# Patient Record
Sex: Female | Born: 1940 | Race: White | Hispanic: No | Marital: Married | Smoking: Former smoker
Health system: Southern US, Community
[De-identification: ages and names within clinical notes are randomized; demographics above are authoritative.]

## PROBLEM LIST (undated history)

## (undated) DIAGNOSIS — I701 Atherosclerosis of renal artery: Secondary | ICD-10-CM

## (undated) DIAGNOSIS — T7840XA Allergy, unspecified, initial encounter: Secondary | ICD-10-CM

## (undated) DIAGNOSIS — E119 Type 2 diabetes mellitus without complications: Secondary | ICD-10-CM

## (undated) DIAGNOSIS — F32A Depression, unspecified: Secondary | ICD-10-CM

## (undated) DIAGNOSIS — Z9289 Personal history of other medical treatment: Secondary | ICD-10-CM

## (undated) DIAGNOSIS — K219 Gastro-esophageal reflux disease without esophagitis: Secondary | ICD-10-CM

## (undated) DIAGNOSIS — M810 Age-related osteoporosis without current pathological fracture: Secondary | ICD-10-CM

## (undated) DIAGNOSIS — I10 Essential (primary) hypertension: Secondary | ICD-10-CM

## (undated) DIAGNOSIS — R112 Nausea with vomiting, unspecified: Secondary | ICD-10-CM

## (undated) DIAGNOSIS — I779 Disorder of arteries and arterioles, unspecified: Secondary | ICD-10-CM

## (undated) DIAGNOSIS — Z9889 Other specified postprocedural states: Secondary | ICD-10-CM

## (undated) DIAGNOSIS — Z789 Other specified health status: Secondary | ICD-10-CM

## (undated) DIAGNOSIS — I739 Peripheral vascular disease, unspecified: Secondary | ICD-10-CM

## (undated) DIAGNOSIS — I5189 Other ill-defined heart diseases: Secondary | ICD-10-CM

## (undated) DIAGNOSIS — G4700 Insomnia, unspecified: Secondary | ICD-10-CM

## (undated) DIAGNOSIS — F419 Anxiety disorder, unspecified: Secondary | ICD-10-CM

## (undated) DIAGNOSIS — E063 Autoimmune thyroiditis: Secondary | ICD-10-CM

## (undated) DIAGNOSIS — G4733 Obstructive sleep apnea (adult) (pediatric): Secondary | ICD-10-CM

## (undated) DIAGNOSIS — K5790 Diverticulosis of intestine, part unspecified, without perforation or abscess without bleeding: Secondary | ICD-10-CM

## (undated) DIAGNOSIS — H04123 Dry eye syndrome of bilateral lacrimal glands: Secondary | ICD-10-CM

## (undated) DIAGNOSIS — E785 Hyperlipidemia, unspecified: Secondary | ICD-10-CM

## (undated) DIAGNOSIS — D649 Anemia, unspecified: Secondary | ICD-10-CM

## (undated) DIAGNOSIS — H269 Unspecified cataract: Secondary | ICD-10-CM

## (undated) DIAGNOSIS — M1990 Unspecified osteoarthritis, unspecified site: Secondary | ICD-10-CM

## (undated) DIAGNOSIS — F329 Major depressive disorder, single episode, unspecified: Secondary | ICD-10-CM

## (undated) HISTORY — DX: Gastro-esophageal reflux disease without esophagitis: K21.9

## (undated) HISTORY — DX: Atherosclerosis of renal artery: I70.1

## (undated) HISTORY — DX: Autoimmune thyroiditis: E06.3

## (undated) HISTORY — DX: Hyperlipidemia, unspecified: E78.5

## (undated) HISTORY — DX: Other ill-defined heart diseases: I51.89

## (undated) HISTORY — DX: Essential (primary) hypertension: I10

## (undated) HISTORY — DX: Unspecified osteoarthritis, unspecified site: M19.90

## (undated) HISTORY — DX: Personal history of other medical treatment: Z92.89

## (undated) HISTORY — PX: OTHER SURGICAL HISTORY: SHX169

## (undated) HISTORY — PX: LIPOSUCTION: SHX10

## (undated) HISTORY — DX: Age-related osteoporosis without current pathological fracture: M81.0

## (undated) HISTORY — DX: Other specified health status: Z78.9

## (undated) HISTORY — DX: Disorder of arteries and arterioles, unspecified: I77.9

## (undated) HISTORY — DX: Obstructive sleep apnea (adult) (pediatric): G47.33

## (undated) HISTORY — DX: Peripheral vascular disease, unspecified: I73.9

## (undated) HISTORY — DX: Allergy, unspecified, initial encounter: T78.40XA

---
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
## 1997-02-28 ENCOUNTER — Ambulatory Visit (HOSPITAL_COMMUNITY): Admission: RE | Admit: 1997-02-28 | Discharge: 1997-02-28 | Payer: Self-pay | Admitting: Family Medicine

## 1997-04-25 ENCOUNTER — Encounter: Admission: RE | Admit: 1997-04-25 | Discharge: 1997-07-24 | Payer: Self-pay | Admitting: Family Medicine

## 1997-05-20 ENCOUNTER — Ambulatory Visit (HOSPITAL_COMMUNITY): Admission: RE | Admit: 1997-05-20 | Discharge: 1997-05-20 | Payer: Self-pay | Admitting: Endocrinology

## 1997-07-24 ENCOUNTER — Emergency Department (HOSPITAL_COMMUNITY): Admission: EM | Admit: 1997-07-24 | Discharge: 1997-07-24 | Payer: Self-pay | Admitting: Emergency Medicine

## 1997-08-14 ENCOUNTER — Ambulatory Visit (HOSPITAL_COMMUNITY): Admission: RE | Admit: 1997-08-14 | Discharge: 1997-08-14 | Payer: Self-pay | Admitting: Family Medicine

## 1998-01-22 ENCOUNTER — Emergency Department (HOSPITAL_COMMUNITY): Admission: EM | Admit: 1998-01-22 | Discharge: 1998-01-22 | Payer: Self-pay | Admitting: Emergency Medicine

## 1998-06-11 ENCOUNTER — Encounter: Payer: Self-pay | Admitting: Family Medicine

## 1998-06-11 ENCOUNTER — Ambulatory Visit (HOSPITAL_COMMUNITY): Admission: RE | Admit: 1998-06-11 | Discharge: 1998-06-11 | Payer: Self-pay | Admitting: Family Medicine

## 1998-07-30 ENCOUNTER — Other Ambulatory Visit: Admission: RE | Admit: 1998-07-30 | Discharge: 1998-07-30 | Payer: Self-pay | Admitting: Family Medicine

## 1998-08-04 ENCOUNTER — Ambulatory Visit: Admission: RE | Admit: 1998-08-04 | Discharge: 1998-08-04 | Payer: Self-pay | Admitting: Family Medicine

## 1998-09-15 ENCOUNTER — Encounter (INDEPENDENT_AMBULATORY_CARE_PROVIDER_SITE_OTHER): Payer: Self-pay | Admitting: Specialist

## 1998-09-15 ENCOUNTER — Ambulatory Visit (HOSPITAL_COMMUNITY): Admission: RE | Admit: 1998-09-15 | Discharge: 1998-09-15 | Payer: Self-pay | Admitting: Gastroenterology

## 1998-09-26 ENCOUNTER — Ambulatory Visit (HOSPITAL_COMMUNITY): Admission: RE | Admit: 1998-09-26 | Discharge: 1998-09-26 | Payer: Self-pay | Admitting: *Deleted

## 1999-04-22 ENCOUNTER — Encounter: Payer: Self-pay | Admitting: Emergency Medicine

## 1999-04-22 ENCOUNTER — Emergency Department (HOSPITAL_COMMUNITY): Admission: EM | Admit: 1999-04-22 | Discharge: 1999-04-23 | Payer: Self-pay | Admitting: Emergency Medicine

## 1999-06-22 ENCOUNTER — Emergency Department (HOSPITAL_COMMUNITY): Admission: EM | Admit: 1999-06-22 | Discharge: 1999-06-22 | Payer: Self-pay | Admitting: Emergency Medicine

## 1999-11-04 ENCOUNTER — Ambulatory Visit (HOSPITAL_COMMUNITY): Admission: RE | Admit: 1999-11-04 | Discharge: 1999-11-04 | Payer: Self-pay | Admitting: Gynecology

## 1999-11-04 ENCOUNTER — Encounter: Payer: Self-pay | Admitting: Gynecology

## 2000-02-05 ENCOUNTER — Other Ambulatory Visit: Admission: RE | Admit: 2000-02-05 | Discharge: 2000-02-05 | Payer: Self-pay | Admitting: Internal Medicine

## 2000-02-29 ENCOUNTER — Encounter: Payer: Self-pay | Admitting: Emergency Medicine

## 2000-02-29 ENCOUNTER — Emergency Department (HOSPITAL_COMMUNITY): Admission: EM | Admit: 2000-02-29 | Discharge: 2000-02-29 | Payer: Self-pay | Admitting: Emergency Medicine

## 2000-04-01 ENCOUNTER — Encounter: Admission: RE | Admit: 2000-04-01 | Discharge: 2000-06-16 | Payer: Self-pay | Admitting: Orthopedic Surgery

## 2000-06-07 ENCOUNTER — Ambulatory Visit (HOSPITAL_COMMUNITY): Admission: RE | Admit: 2000-06-07 | Discharge: 2000-06-07 | Payer: Self-pay | Admitting: Gastroenterology

## 2000-11-08 ENCOUNTER — Encounter: Payer: Self-pay | Admitting: Family Medicine

## 2000-11-08 ENCOUNTER — Ambulatory Visit (HOSPITAL_COMMUNITY): Admission: RE | Admit: 2000-11-08 | Discharge: 2000-11-08 | Payer: Self-pay | Admitting: Family Medicine

## 2000-11-25 ENCOUNTER — Encounter: Admission: RE | Admit: 2000-11-25 | Discharge: 2000-11-25 | Payer: Self-pay | Admitting: Family Medicine

## 2000-11-30 ENCOUNTER — Encounter: Payer: Self-pay | Admitting: General Surgery

## 2000-11-30 ENCOUNTER — Ambulatory Visit (HOSPITAL_COMMUNITY): Admission: RE | Admit: 2000-11-30 | Discharge: 2000-11-30 | Payer: Self-pay | Admitting: General Surgery

## 2000-12-01 ENCOUNTER — Ambulatory Visit (HOSPITAL_COMMUNITY): Admission: RE | Admit: 2000-12-01 | Discharge: 2000-12-01 | Payer: Self-pay | Admitting: Family Medicine

## 2001-01-30 ENCOUNTER — Encounter: Admission: RE | Admit: 2001-01-30 | Discharge: 2001-01-30 | Payer: Self-pay | Admitting: Family Medicine

## 2001-01-30 ENCOUNTER — Encounter: Payer: Self-pay | Admitting: Family Medicine

## 2001-02-02 ENCOUNTER — Encounter: Admission: RE | Admit: 2001-02-02 | Discharge: 2001-02-02 | Payer: Self-pay | Admitting: Family Medicine

## 2001-02-02 ENCOUNTER — Encounter: Payer: Self-pay | Admitting: Family Medicine

## 2001-06-16 ENCOUNTER — Ambulatory Visit (HOSPITAL_BASED_OUTPATIENT_CLINIC_OR_DEPARTMENT_OTHER): Admission: RE | Admit: 2001-06-16 | Discharge: 2001-06-16 | Payer: Self-pay | Admitting: Family Medicine

## 2001-07-07 ENCOUNTER — Ambulatory Visit (HOSPITAL_BASED_OUTPATIENT_CLINIC_OR_DEPARTMENT_OTHER): Admission: RE | Admit: 2001-07-07 | Discharge: 2001-07-07 | Payer: Self-pay | Admitting: Family Medicine

## 2001-12-04 ENCOUNTER — Encounter: Payer: Self-pay | Admitting: Internal Medicine

## 2001-12-04 ENCOUNTER — Ambulatory Visit (HOSPITAL_COMMUNITY): Admission: RE | Admit: 2001-12-04 | Discharge: 2001-12-04 | Payer: Self-pay | Admitting: Internal Medicine

## 2002-03-01 ENCOUNTER — Ambulatory Visit (HOSPITAL_COMMUNITY): Admission: RE | Admit: 2002-03-01 | Discharge: 2002-03-01 | Payer: Self-pay | Admitting: Family Medicine

## 2002-03-01 ENCOUNTER — Encounter: Payer: Self-pay | Admitting: Family Medicine

## 2002-04-03 ENCOUNTER — Other Ambulatory Visit: Admission: RE | Admit: 2002-04-03 | Discharge: 2002-04-03 | Payer: Self-pay | Admitting: Obstetrics and Gynecology

## 2002-07-25 ENCOUNTER — Encounter: Payer: Self-pay | Admitting: Gastroenterology

## 2002-07-25 ENCOUNTER — Encounter: Admission: RE | Admit: 2002-07-25 | Discharge: 2002-07-25 | Payer: Self-pay | Admitting: Gastroenterology

## 2002-08-14 ENCOUNTER — Encounter: Admission: RE | Admit: 2002-08-14 | Discharge: 2002-08-14 | Payer: Self-pay | Admitting: Obstetrics and Gynecology

## 2002-08-14 ENCOUNTER — Encounter: Payer: Self-pay | Admitting: Obstetrics and Gynecology

## 2002-12-27 ENCOUNTER — Encounter: Admission: RE | Admit: 2002-12-27 | Discharge: 2002-12-27 | Payer: Self-pay | Admitting: Gastroenterology

## 2003-02-20 ENCOUNTER — Encounter: Admission: RE | Admit: 2003-02-20 | Discharge: 2003-02-20 | Payer: Self-pay | Admitting: Obstetrics and Gynecology

## 2003-05-03 ENCOUNTER — Encounter: Admission: RE | Admit: 2003-05-03 | Discharge: 2003-05-03 | Payer: Self-pay | Admitting: Orthopedic Surgery

## 2003-05-09 ENCOUNTER — Other Ambulatory Visit: Admission: RE | Admit: 2003-05-09 | Discharge: 2003-05-09 | Payer: Self-pay | Admitting: Obstetrics and Gynecology

## 2003-11-02 ENCOUNTER — Emergency Department (HOSPITAL_COMMUNITY): Admission: EM | Admit: 2003-11-02 | Discharge: 2003-11-02 | Payer: Self-pay

## 2004-01-26 HISTORY — PX: NASAL SINUS SURGERY: SHX719

## 2004-01-28 ENCOUNTER — Ambulatory Visit: Payer: Self-pay | Admitting: Internal Medicine

## 2004-03-24 ENCOUNTER — Ambulatory Visit: Payer: Self-pay | Admitting: Internal Medicine

## 2004-04-14 ENCOUNTER — Emergency Department (HOSPITAL_COMMUNITY): Admission: EM | Admit: 2004-04-14 | Discharge: 2004-04-14 | Payer: Self-pay | Admitting: Family Medicine

## 2004-04-30 ENCOUNTER — Ambulatory Visit: Payer: Self-pay | Admitting: Internal Medicine

## 2004-06-23 ENCOUNTER — Ambulatory Visit: Payer: Self-pay | Admitting: Internal Medicine

## 2004-07-03 ENCOUNTER — Encounter: Admission: RE | Admit: 2004-07-03 | Discharge: 2004-07-03 | Payer: Self-pay | Admitting: Internal Medicine

## 2004-07-08 ENCOUNTER — Encounter (INDEPENDENT_AMBULATORY_CARE_PROVIDER_SITE_OTHER): Payer: Self-pay | Admitting: *Deleted

## 2004-07-08 ENCOUNTER — Ambulatory Visit: Payer: Self-pay | Admitting: Internal Medicine

## 2004-07-10 ENCOUNTER — Emergency Department (HOSPITAL_COMMUNITY): Admission: EM | Admit: 2004-07-10 | Discharge: 2004-07-10 | Payer: Self-pay | Admitting: Family Medicine

## 2004-07-24 ENCOUNTER — Ambulatory Visit: Payer: Self-pay | Admitting: Internal Medicine

## 2004-07-30 ENCOUNTER — Ambulatory Visit (HOSPITAL_COMMUNITY): Admission: RE | Admit: 2004-07-30 | Discharge: 2004-07-30 | Payer: Self-pay | Admitting: Internal Medicine

## 2004-07-30 ENCOUNTER — Ambulatory Visit: Payer: Self-pay | Admitting: Internal Medicine

## 2004-08-06 ENCOUNTER — Ambulatory Visit: Payer: Self-pay | Admitting: Internal Medicine

## 2004-08-20 ENCOUNTER — Ambulatory Visit: Payer: Self-pay | Admitting: Internal Medicine

## 2004-09-03 ENCOUNTER — Ambulatory Visit: Payer: Self-pay | Admitting: Internal Medicine

## 2004-09-15 ENCOUNTER — Ambulatory Visit (HOSPITAL_COMMUNITY): Admission: RE | Admit: 2004-09-15 | Discharge: 2004-09-15 | Payer: Self-pay | Admitting: Otolaryngology

## 2004-09-18 ENCOUNTER — Encounter (INDEPENDENT_AMBULATORY_CARE_PROVIDER_SITE_OTHER): Payer: Self-pay | Admitting: Specialist

## 2004-09-18 ENCOUNTER — Observation Stay (HOSPITAL_COMMUNITY): Admission: RE | Admit: 2004-09-18 | Discharge: 2004-09-19 | Payer: Self-pay | Admitting: Otolaryngology

## 2004-11-13 ENCOUNTER — Encounter (INDEPENDENT_AMBULATORY_CARE_PROVIDER_SITE_OTHER): Payer: Self-pay | Admitting: *Deleted

## 2004-11-13 ENCOUNTER — Ambulatory Visit: Payer: Self-pay | Admitting: Internal Medicine

## 2004-12-31 ENCOUNTER — Ambulatory Visit: Payer: Self-pay | Admitting: Internal Medicine

## 2005-04-27 LAB — HM COLONOSCOPY: HM Colonoscopy: NORMAL

## 2005-07-27 ENCOUNTER — Ambulatory Visit: Payer: Self-pay | Admitting: Internal Medicine

## 2005-09-01 ENCOUNTER — Ambulatory Visit: Payer: Self-pay | Admitting: Internal Medicine

## 2005-11-03 ENCOUNTER — Encounter (INDEPENDENT_AMBULATORY_CARE_PROVIDER_SITE_OTHER): Payer: Self-pay | Admitting: *Deleted

## 2005-11-03 ENCOUNTER — Encounter: Admission: RE | Admit: 2005-11-03 | Discharge: 2005-11-03 | Payer: Self-pay

## 2005-11-15 DIAGNOSIS — M2550 Pain in unspecified joint: Secondary | ICD-10-CM | POA: Insufficient documentation

## 2005-11-15 DIAGNOSIS — R002 Palpitations: Secondary | ICD-10-CM

## 2005-11-15 DIAGNOSIS — M549 Dorsalgia, unspecified: Secondary | ICD-10-CM | POA: Insufficient documentation

## 2005-11-15 DIAGNOSIS — H1045 Other chronic allergic conjunctivitis: Secondary | ICD-10-CM | POA: Insufficient documentation

## 2005-11-15 DIAGNOSIS — K219 Gastro-esophageal reflux disease without esophagitis: Secondary | ICD-10-CM | POA: Insufficient documentation

## 2005-11-15 DIAGNOSIS — J309 Allergic rhinitis, unspecified: Secondary | ICD-10-CM | POA: Insufficient documentation

## 2005-11-15 DIAGNOSIS — K649 Unspecified hemorrhoids: Secondary | ICD-10-CM | POA: Insufficient documentation

## 2005-11-15 DIAGNOSIS — M810 Age-related osteoporosis without current pathological fracture: Secondary | ICD-10-CM

## 2005-11-15 DIAGNOSIS — K8020 Calculus of gallbladder without cholecystitis without obstruction: Secondary | ICD-10-CM | POA: Insufficient documentation

## 2005-11-15 DIAGNOSIS — E0500 Thyrotoxicosis with diffuse goiter without thyrotoxic crisis or storm: Secondary | ICD-10-CM | POA: Insufficient documentation

## 2005-11-15 DIAGNOSIS — E039 Hypothyroidism, unspecified: Secondary | ICD-10-CM | POA: Insufficient documentation

## 2005-11-15 DIAGNOSIS — K589 Irritable bowel syndrome without diarrhea: Secondary | ICD-10-CM

## 2005-11-17 ENCOUNTER — Ambulatory Visit: Payer: Self-pay | Admitting: Internal Medicine

## 2006-01-10 DIAGNOSIS — M1990 Unspecified osteoarthritis, unspecified site: Secondary | ICD-10-CM | POA: Insufficient documentation

## 2006-02-04 ENCOUNTER — Ambulatory Visit: Payer: Self-pay | Admitting: Internal Medicine

## 2006-02-14 ENCOUNTER — Encounter: Admission: RE | Admit: 2006-02-14 | Discharge: 2006-02-14 | Payer: Self-pay | Admitting: Gastroenterology

## 2006-04-22 ENCOUNTER — Ambulatory Visit: Payer: Self-pay | Admitting: Internal Medicine

## 2006-04-28 LAB — HM PAP SMEAR: HM Pap smear: NORMAL

## 2006-05-10 ENCOUNTER — Ambulatory Visit: Payer: Self-pay | Admitting: Internal Medicine

## 2006-06-15 ENCOUNTER — Encounter: Admission: RE | Admit: 2006-06-15 | Discharge: 2006-06-15 | Payer: Self-pay | Admitting: Gastroenterology

## 2006-09-22 ENCOUNTER — Ambulatory Visit: Payer: Self-pay | Admitting: Unknown Physician Specialty

## 2006-09-25 ENCOUNTER — Ambulatory Visit: Payer: Self-pay | Admitting: Unknown Physician Specialty

## 2006-11-09 ENCOUNTER — Ambulatory Visit: Payer: Self-pay | Admitting: Unknown Physician Specialty

## 2007-09-18 ENCOUNTER — Ambulatory Visit: Payer: Self-pay | Admitting: Family Medicine

## 2009-01-29 ENCOUNTER — Ambulatory Visit: Payer: Self-pay

## 2009-04-27 LAB — HM DEXA SCAN

## 2009-08-24 ENCOUNTER — Emergency Department: Payer: Self-pay | Admitting: Internal Medicine

## 2009-09-23 ENCOUNTER — Ambulatory Visit: Payer: Self-pay | Admitting: Unknown Physician Specialty

## 2009-12-05 ENCOUNTER — Ambulatory Visit: Payer: Self-pay

## 2010-01-01 ENCOUNTER — Emergency Department: Payer: Self-pay | Admitting: Emergency Medicine

## 2010-02-14 ENCOUNTER — Encounter: Payer: Self-pay | Admitting: Gastroenterology

## 2010-02-15 ENCOUNTER — Encounter: Payer: Self-pay | Admitting: Otolaryngology

## 2010-03-05 ENCOUNTER — Ambulatory Visit: Payer: Self-pay | Admitting: Family Medicine

## 2010-04-28 LAB — HM MAMMOGRAPHY: HM Mammogram: NORMAL

## 2010-06-12 NOTE — Procedures (Signed)
[place]  Patient:    Yvonne Davidson, Yvonne Davidson                   MRN: [id number] Proc. Date: 06/16/00 Attending:  Rich Brave CC:         Chales Salmon. Abigail Miyamoto, M.D.   Procedure Report  PROCEDURE:  Twenty-four ambulatory esophageal pH monitoring.  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  FINDINGS:  Mild reflux into the proximal probe while in the upright position, consistent with a diagnosis of laryngopharyngeal reflux.  DESCRIPTION OF PROCEDURE:  The procedure was performed immediately following her esophageal manometry.  The probe was placed, and the patient went home with it and returned almost exactly 24 hours later.  She did not keep a very complete symptom diary, but she notes that this was a relatively good day during the time that the recording was made, perhaps because she stopped some medications that she ordinarily takes and which she thinks tend to bother her.  FINDINGS:  Proximal esophageal probe:  The proximal esophageal probe had a total acid exposure time of 0.4% (0.7% while in the upright position).  While this is considered within normal limits by the parameters provided by the software program, experienced ENT specialists in pharyngeal reflux feel that any acid exposure in the proximal probe is abnormal, and the fact this occurred in the upright position is very characteristic of laryngopharyngeal reflux.  The longest episode lasted two minutes.  In the distal probe, the DeMeester score came out within normal limits, namely 9.7, with the normal being up to 22.0.  Total acid reflux time in the distal probe was 0.9%, with normal being up to 5.5%, and no episodes of reflux lasted over five minutes.  IMPRESSION:  Evidence for laryngopharyngeal reflux based on this study, as described above.  PLAN:  I would continue this patient on PPI  therapy. DD:  06/16/00 TD:  06/17/00 Job: [id number]

## 2010-06-12 NOTE — Letter (Signed)
April 22, 2006    Cammy Copa, M.D.  [facility]  [facility]  Fax [phone].   RE:  Yvonne Davidson, Yvonne Davidson  MRN:  [id number]  /  DOB:  Sep 21, 1940   Dear Ananias Pilgrim:   Apparently, I am to write a letter of reference so that you can meet  Georgianne Fick for her concerns of sleep disordered breathing. I had  discussed availability of referral for another opinion and she acted on  it herself. I apologize for the hold up. I have seen this woman a number  of times over several years, and most recently on March 28 of this year.  I understand that our records have already been sent and my most recent  note of 3/28 should be descriptive. She has been evaluated in  [place], [place], and [place], and has tried a variety of CPAP  masks and pressures. Not all of her studies have confirmed sleep apnea.  She had a polysomnogram on 04/04/2005 at [facility] recording an AHI of 8.48  per hour, mostly central apneas and hypopneas. They titrated her on  BiPAP, settling for inspiratory 10/expiratory 6 CWP. She had not been  able to get comfortable with either CPAP or BiPAP. The description she  gave me focused on her concerns about blood pressure management,  thinking that she was about to have a cerebrovascular event because of  the lack of oxygen at night. She blamed sleep apnea, although the  discomforts were usually as she became distressed while lying awake. Her  concept was that she would wake up sometimes with a sense that she had  held her breath and sometimes not. She will feel stressed and restless,  and will get some symptoms suggestive of hyperventilation. She has been  quite anxious, which is where I hope that you might be able to make some  contribution. Her chart now is substantial, including a lot of outside  records. I will let you review the records that she has already  requested. If there is anything more that you need, please contact us  and  I will see what I can do.   Thank you for your help.    Sincerely,      Clinton D. Maple Hudson, MD, Tonny Bollman, FACP  Electronically Signed    CDY/MedQ  DD: 06/30/2006  DT: 07/01/2006  Job #: [id number]

## 2010-06-12 NOTE — Assessment & Plan Note (Signed)
[facility]                               PULMONARY OFFICE NOTE   ANNAKA, CLEAVER                   MRN:          [id number]  DATE:11/17/2005                            DOB:          05-28-1940    PROBLEM:  1. Obstructive sleep apnea.  2. Allergic rhinitis.  3. History of sinus surgeries.  4. Chronic fatigue and non-restorative sleep.  5. Esophageal reflux.  6. Hypothyroidism.   HISTORY:  She gave up on her bilevel machine for sleep apnea after calling  me October 7 and saying that it was too uncomfortable, associated with  frequent headaches and nose bleeds and her eyes felt swollen and that she  was having to irrigate her sinuses every day.  She said she felt better  without it and was pending an appointment with Dr. Annalee Genta.  She has had 3  sleep studies this year at [facility] and now sees me for her sleep apnea  after working with those doctors.  I do not have any of those reports.  We  told her to go ahead and stop the BIPAP for now, send me the [facility]  reports and call me after she had seen Dr. Annalee Genta again.  She had been  trying to sleep propped up, both to deal with sleep apnea and also to  relieve head congestion but finds that she is sliding off of her wedge.  She  has been doing daily nasal saline irrigation and blaming allergy for a  left hemi-cranial headache and some occasional erectile orbital pressure.  She understood that Dr. Annalee Genta felt her airway looked pretty good now  after surgery.  He had sent her for a second surgical opinion to a Dr.  Gillermina Hu at [facility] to discuss surgical sleep apnea options and she said  Dr. Gillermina Hu was not very encouraging about the odds, offering her 40% chance  of a good clearing of her apnea.  She also evaluated with a dentist at  [facility] for oral appliances and was told her teeth were not good enough  and she said that exam was not encouraging either.  Primary  complaint is of  fatigue and non-restorative sleep.   MEDICATION:  1. Atenolol 75 mg.  2. Evista 60 mg.  3. Lisinopril 5 mg.  4. Patanol.  5. Zyrtec.  6. Synthroid 50 mcg.  7. Multivitamins and minerals.  8. Flonase.  9. BIPAP had been on 9/6.   DRUG INTOLERANCES:  To HCTZ, CODEINE, SUMYCIN, INDOCIN, NOVOCAIN, OXYCODONE,  NEXIUM, PRILOSEC, KETEK and TRIPLE CARE SALVE.   OBJECTIVE:  Weight 149 pounds.  Blood pressure 106/66.  Pulse regular 50.  Room air saturation 98%.  LUNGS:  Are clear.  She has a long palate without postnasal drainage.  Voice quality is normal.  There is no strider.  She has a little bit of turbinate puffiness, but no  obstruction.  Thyroid is not enlarged.  Heart sound are normal.   IMPRESSION:  Her primary complaint is of non-restorative sleep and fatigue.  We know she has sleep apnea, I am not sure  how much of her sleep complaint  really is related to inadequately treated sleep apnea and how much may be  depression, but she is discouraged about the lack of clear forward option.   PLAN:  1. I discussed Vivactil as a nonsedating antidepressant that might be of      some help with at least some of her symptoms and explained it had been      used in the past for mild sleep apnea.  Try samples of Vivactil 10 mg      1/2 daily.  Suggest trying them in the morning first.  2. If that does not help then we will relook at the whole issue of sleep      apnea control with CPAP.  3. Schedule return in 2 months, earlier as needed.       Clinton D. Maple Hudson, MD, FCCP, FACP      CDY/MedQ  DD:  11/17/2005  DT:  11/18/2005  Job #:  [id number]   cc:   Onalee Hua L. Annalee Genta, M.D.

## 2010-06-12 NOTE — Assessment & Plan Note (Signed)
[facility]                               PULMONARY OFFICE NOTE   Yvonne Davidson, Yvonne Davidson                   MRN:          [id number]  DATE:09/01/2005                            DOB:          1940-09-27    1. Obstructive sleep apnea.  2. Allergic rhinitis.  3. History of sinus surgeries.  4. Chronic fatigue and nonrestorative sleep.  5. Esophageal reflux.  6. Hypothyroid.   HISTORY:  She has been using BiPAP with an inspiratory pressure of 9 and an  expiratory pressure of 6.  She did not try the Provigil.  She complains that  when she wakes in the morning she still is puffy around the eyes, and she  interprets this as a sign that her blood pressure is elevated from lack of  oxygen.  She also feels in a nonspecific way that her airway is messed up.  She describes three acute events during which she thinks she aspirated.  What she describes sounds like laryngospasm without recognized associated  heartburn.  She does choke easily but is careful when eating, and she sips  water to calm her throat.  She complains of sinus congestion, eyes watering.  She says Flonase is not as good as Astelin.  She feels better with BiPAP  than without it.   MEDICATIONS:  1. Atenolol 75 mg.  2. Evista 60 mg.  3. Lisinopril 5 mg.  4. Patanol.  5. Lubricant eye drops.  6. Zyrtec.  7. Synthroid 50 mcg.  8. OTC Prilosec.  9. Calcium.  10.Multivitamins.  11.Flonase.  12.Provigil.  She says she is not using.  13.Astelin.  We are adding today.  14.BiPAP.   ALLERGIES:  DRUG INTOLERANCES TO HCTZ, CODEINE, SUMYCIN, INDOCIN, NOVOCAIN,  OXYCODONE, NEXIUM, PRILOSEC, KETEK, AND TRIPLE CARE SALVE.   OBJECTIVE:  Weight 149 pounds, BP 136/70, pulse regular at 47, room air  saturation 98%.  She is alert, very talkative, in no evident distress.  There are no pressure marks on her face from the mask.  Her conjunctivae are  clear.  Her pharynx is clear with no erythema and no  adenomatous prominence  and no postnasal drainage.  Voice quality is normal.  There is no neck vein  distention, stridor or thyromegaly that I appreciate today.  Chest is quiet  and clear with good air flow.  Heart sounds are regular without murmur or  gallop.   IMPRESSION:  1. Atypical patient understanding of her medical symptoms.  2. Allergic and vasomotor rhinitis.  3. Obstructive sleep apnea.  4. Anxiety component.  5. Esophageal reflux with probable occasional laryngospasm.   PLAN:  1. An effort was made at education, reassurance and explanation.  2. Suggested that she might try caffeine occasionally as a gentle      stimulant if necessary but main effort is on sleep hygiene.  3. Sample and prescription for Astelin nasal spray once each nostril      b.i.d. p.r.n.  4. I considered Singulair trial, but we are out of samples.  5. We are going to have [facility] change her BiPAP to  an      inspiratory pressure of 9 and an expiratory pressure of 5.  6. Elevate head of bed for complaint of head congestion.  7. Consider going to the [facility] for yoga classes for relaxation and better      somatic control.  8. Schedule return in 3 months, earlier p.r.n.                                   Clinton D. Maple Hudson, MD, FCCP, FACP   CDY/MedQ  DD:  09/02/2005  DT:  09/02/2005  Job #:  [id number]

## 2010-06-12 NOTE — Assessment & Plan Note (Signed)
[facility]                                 ON-CALL NOTE   Yvonne Davidson, Yvonne Davidson                   MRN:          [id number]  DATE:12/17/2005                            DOB:          07-13-1940    [name] was calling the office, but it had already closed.  She  had questions about her CPAP.  I instructed her to call the office on  Monday, December 20, 2005.  She was advised to do this and said she  would follow through.     Charlcie Cradle Delford Field, MD, [facility]  Electronically Signed    PEW/MedQ  DD: 12/17/2005  DT: 12/17/2005  Job #: [id number]   cc:   Joni Fears D. Maple Hudson, MD, FCCP, FACP

## 2010-06-12 NOTE — Assessment & Plan Note (Signed)
[facility]                             PULMONARY OFFICE NOTE   TERRA, AVENI                   MRN:          [id number]  DATE:04/22/2006                            DOB:          1940/06/27    PROBLEMS:  1. Obstructive sleep apnea.  2. Insomnia.  3. Allergic rhinitis.  4. History of sinus surgeries.  5. Chronic fatigue and non-restorative sleep.  6. Esophageal reflux.  7. Hypothyroid.   HISTORY:  She complains that her blood pressure rises when she is  uncomfortable, because of  lack of oxygen at night. She wakes up then  feels stressed, gets restless, tosses and turns a lot, and says that her  feet and fingers tingle. For all of this she blames her sleep apnea,  although her complaint is that she lies awake tossing and turning. She  is pretty sure that she must be stopping breathing and is frustrated  that she cannot tolerate her BiPAP, this was turned back in for her  CPAP, which has been set on 8 CWP, saying that too much air is blowing  in her face and there is too much pressure on her nose from the mask.  She wakes with the impression that she is gasping for air and says that  with CPAP she was air swallowing. She believed BiPAP caused her face to  swell despite trying several different masks. She had previously  reported sleep studies at [facility], which had titrated her to a BiPAP  pressure of 14/12 with auto-titration. She had talked with her primary  physician, Dr. Dareen Piano at [facility]. He sent her to the [facility] sleep center and they gave her a full face mask which she  thinks she likes better. She had been previously evaluated at the [facility] about oral appliances and was told that her teeth  were not strong enough.  Currently, she only has a CPAP machine at home  and she has not been using it. She agrees to try with a reduction in  pressure to 6 CWP empirically. She then shifted  discussion to spring  pollen concerns, saying that her eyes were itching and watering and she  asked for a sample of Optivar. She very much likes Singulair.   MEDICATIONS:  1. Atenolol 75 mg.  2. Evista 60 mg.  3. Lisinopril 5 mg.  4. Lubricant eye drops.  5. Synthroid 50 mcg.  6. Multivitamins.  7. Flonase.  8. Singulair.  9. Optivar.   OBJECTIVE:  Weight 146 pounds, blood pressure 122/70, pulse regular 54,  room air saturation 98%. Pressured speech. It was difficult to  interrupt, to get in questions, and responses. Thyroid is not enlarged.  No tremor. Palms dry. No strider. Nasal airway is unobstructed. Lungs  are clear. Heart sounds normal.   IMPRESSION:  1. Previously demonstrated obstructive sleep apnea, but with a      significant anxiety and insomnia component.  2. Seasonal allergic rhinitis.   PLAN:  1. Sample Optivar.  2. Sample Lunesta 2 mg with discussion.  3. We will have  advanced reduce her CPAP to 6 CWP, scheduling return      in 1 month.     Clinton D. Maple Hudson, MD, Tonny Bollman, FACP  Electronically Signed    CDY/MedQ  DD: 04/22/2006  DT: 04/23/2006  Job #: [id number]

## 2010-06-12 NOTE — Op Note (Signed)
NAME:  Yvonne Davidson, Yvonne Davidson            ACCOUNT NO.:  [id number]   MEDICAL RECORD NO.:  [id number]          PATIENT TYPE:  OBV   LOCATION:  4707                         FACILITY:  [facility]   PHYSICIAN:  Kinnie Scales. Annalee Genta, M.D.DATE OF BIRTH:  12/24/1940   DATE OF PROCEDURE:  09/18/2004  DATE OF DISCHARGE:                                 OPERATIVE REPORT   PREOPERATIVE DIAGNOSES:  1.  Chronic sinusitis.  2.  Opacified right sphenoid sinus.  3.  Left ethmoid osteoma.   POSTOPERATIVE DIAGNOSES:  1.  Chronic sinusitis.  2.  Opacified right sphenoid sinus.  3.  Left ethmoid osteoma.   INDICATION FOR PROCEDURE:  1.  Chronic sinusitis.  2.  Opacified right sphenoid sinus.  3.  Left ethmoid osteoma.   SURGICAL PROCEDURE:  Revision endoscopic sinus surgery with right  sphenoidotomy and removal of diseased tissue, left maxillary antrostomy with  removal of diseased tissue and resection of left ethmoid osteoma with  stealth computer-assisted navigation.   ANESTHESIA:  General endotracheal.   SURGEON:  Kinnie Scales. Annalee Genta, M.D.   COMPLICATIONS:  None.   BLOOD LOSS:  Less than 100 mL.  Patient transferred from the operating room  to the recovery room in stable condition.   BRIEF HISTORY:  Ms. Erby is a 70 year old white female who presented  for evaluation of chronic sinus complaints with postnasal discharge,  coughing and intermittent headaches.  The patient had undergone prior  endoscopic sinus surgery by Dr. Lazarus Salines in 1991 and had had an excellent  result with good long-term improvement in recurrent infections.  The patient  had been treated with antibiotics and a CT scan was obtained which showed an  opacification of the right sphenoid sinus.  She also noted to have mucosal  thickening and disease within the left maxillary sinus and a small bony mass  consistent with osteoma in the left ethmoid/nasal frontal region.  Given the  patient's history, examination and physical  findings, with failure to  respond to antibiotic therapy, I recommended that we consider endoscopic  sinus surgery primarily directed toward the right sphenoid sinus to  establish proper drainage and reduce the risk of long-term infection.  I  also recommended removing the ethmoid osteoma and maxillary sinus disease on  the left.  The risk, benefits and possible complications of revision  endoscopic sinus surgery were discussed in detail with the patient, who  understood and concurred with our plan for surgery which is scheduled as  above.  Prior to surgery, a CT scan was obtained for computer-assisted  navigation (stealth) which was used throughout the surgical procedure.   OPERATION:  The patient was brought to the operating room on 09/18/2004 at  [facility] OR.  General endotracheal anesthesia was  established without difficulty.  When the patient was adequately  anesthetized, her nose was injected a total of 10 mL of 1% lidocaine and  100:000 epinephrine, which were was injected along the middle turbinate and  lateral nasal wall bilaterally as well as posterior ethmoid region, sphenoid  and the transoral injection of the sphenopalatine ostium.  The patient's  nose  was then packed with Afrin-soaked cottonoid pledgets which were left in  place for approximately 10 minutes for operative vasoconstriction.  She was  prepped and draped and the Stealth computer-assisted navigation device was  applied and anatomical surgical landmarks were identified and confirmed.   Surgical procedure was begun with 0-degree endoscopy.  The patient was found  to have significant scar tissue along the right middle turbinate obstructing  the ostiomeatal complex.  She also had complete opacification of the ostium  of the right sphenoid sinuses.  This was gently palpated under Stealth  guidance and a moderate amount of mucopurulent material was suctioned and  this was sent to Microbiology for  culture, sensitivity and Gram's stain.  The natural ostium of the sphenoid sinus was then visualized directly and  palpated.  The bony and soft tissue margins were resected using through-  cutting forceps and microdebrider was used to enlarge the ostium in a  lateral and inferior direction, creating a widely patent right sphenoid  ostium.  There was minimal bleeding around the margin of the ostium, which  was cauterized with suction cautery.  The residual scar tissue and disease  along the right anterior ethmoid area was resected using a microdebrider and  a small portion of anterior middle turbinate concha bullosa was also  resected.  the remainder of the sinuses on the right-hand side appeared  patent and well-healed with no minimal scar tissue and no evidence of  infection.   Attention was then turned the patient's left-hand side where a 0-degree  endoscopy was performed.  The maxillary and ethmoid sinuses appeared to be  patent.  There was a large polypoid mass with an associated osteoma in the  superior aspect of the ethmoid sinus on the left.  The left sphenoid ostium  was patent.  There was also a soft tissue mass within the left maxillary  sinus.  Using a 30-degree telescope and a curved microdebrider. the natural  ostium of the left maxillary sinus was enlarged and the curved microdebrider  was inserted in the sinus in order to resect polypoid material and findings  were consistent with a small mucous retention cyst, no evidence of  infection.  Dissection was then carried out along the ethmoid sinus from  posterior-to-anterior along the roof of the ethmoid.  There was a 3-mm bony  mass in the superior aspect of the ethmoid cavity adjacent to the  nasofrontal recess.  This was gently palpated and reflected anteriorly.  Using a through-cutting forceps, I was able to remove the osteoma in its  entirety, preserving the surrounding mucosa.  There was a small tear in the lateral aspect  of the middle turbinate, but no other significant trauma to  the region and the nasal frontal recess was widely patent at the conclusion  of the conclusion of the surgical procedure.  A small amount of polypoid  mucosa was then resected and that portion of procedure was concluded.  Attention was then turned to the left sphenoid ostium, which was patent, and  there was no evidence of infection.  There was a small scar tissue band  separate in the sinus ostium, which was resected with through-cutting  forceps, which was accomplished with minimal bleeding.  The patient's nasal  cavity and nasopharynx were then irrigated and suctioned, orogastric tube  was passed and stomach contents were aspirated.  The patient was awakened  from her anesthetic, she was extubated and was then transferred from the  operating room to the  recovery room in stable condition.  No complications.  Estimated blood loss was approximately 100 mL.           ______________________________  Kinnie Scales. Annalee Genta, M.D.     DLS/MEDQ  D:  53/66/4403  T:  09/19/2004  Job:  [id number]

## 2010-06-12 NOTE — Procedures (Signed)
[place]  Patient:    Yvonne Davidson, Yvonne Davidson                   MRN: [id number] Proc. Date: 06/07/00 Adm. Date:  [id number] Disc. Date: [id number] Attending:  Rich Brave CC:         Chales Salmon. Abigail Miyamoto, M.D.   Procedure Report  PROCEDURE:  Esophageal manometry.  INDICATION:  This is a patient being considered for 24-hour ambulatory manometry because of possible laryngopharyngeal reflux disease, atypical in character, and not readily responsive to antireflux medication.  FINDINGS:  Normal exam other than some isolated increased amplitudes of contraction.  DESCRIPTION OF PROCEDURE:  The patient provided written consent for the procedure, which was performed by the endoscopy nurse at [facility].  The patient had been off her Zantac for several days prior to the procedure.  The solid-state manometry catheter was passed transnasally and positioned at the lower esophageal sphincter, whereupon it was polled in a sequential fashion and after obtaining an LES study, the esophageal body study and upper esophageal sphincter studies were performed.  FINDINGS: 1. LES:  The lower esophageal sphincter had slightly elevated resting tone of    47 mmHg (normal up to 45) and fairly complete relaxation (93%).  It relaxed    normally with swallows. 2. Esophageal body:  Except for a couple of locations where the amplitudes    were moderately above the normal range (approximately 30 mmHg above the    upper limit of normal), the amplitude and durations of contractions in the    esophageal body were within normal limits overall.  No spontaneous,    repetitive, or aperistaltic activity was noted. 3. Upper esophageal sphincter:  The UES had coordinated relaxation in    association with pharyngeal contractions.  IMPRESSION:  Esophageal manometry with minimal nonspecific abnormalities noted above.  Overall, a normal exam.  PLAN:  Proceed to 24-hour  ambulatory pH monitoring. DD:  06/16/00 TD:  06/17/00 Job: [id number]

## 2010-06-12 NOTE — Assessment & Plan Note (Signed)
[facility]                               PULMONARY OFFICE NOTE   AVEN, CEGIELSKI                   MRN:          [id number]  DATE:11/17/2005                            DOB:          [birth year]    PROBLEM LIST:  1. Obstructive sleep apnea.  2. Allergic rhinitis.  3. History of sinus surgeries.  4. Chronic fatigue and nonrestorative sleep.  5. Esophageal reflux.  6. Hypothyroid.   HISTORY:  Has been sleeping on a wedge but says she slides off and begins  snoring. She has felt worse on BiPAP at inspiratory 9, expiratory 6 than  without so she stopped using it. Increased nasal congestion. 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 Dr. Annalee Genta had done surgery a year ago  and she saw him recently. He sent her for a second surgical opinion to a  physician at [facility] for obstructive sleep apnea surgery and she also  saw a dentist there for consideration of an oral appliance to treat sleep  apnea. She was not encouraged by either approach.   MEDICATIONS:  1. Atenolol 75 mg.  2. Evista 60 mg.  3. Lisinopril 5 mg.  4. Patanol lubricant eye drops.  5. Zyrtec.  6. Synthroid.  7. Multivitamin.  8. Herbal medications.  9. Flonase.  10.BiPAP inspiratory 9, expiratory 6.   DRUG INTOLERANCES:  HYDROCHLOROTHIAZIDE, CODEINE, SUMYCIN, INDOCIN,  NOVOCAIN, OXYCODONE, NEXIUM, PRILOSEC, KETEK and TRIPLE CARE SALVE.   OBJECTIVE:  VITAL SIGNS:  Weight 149 pounds, BP 106/66, pulse regular 50,  room air saturation 98%.  HEENT:  Long palate obscures the posterior pharynx with palate length 3/4  which would predispose her to snoring. I do not see obvious erythema or  postnasal drainage and her nose is not especially congested today.  LUNGS:  Fields are clear.  HEART:  Sounds regular without murmur.  GENERAL:  She seems alert and appropriate.   IMPRESSION:  Complaint of  fatigue, obstructive sleep apnea, not tolerating  BiPAP well. She may need very low pressures. I do suspect she may have  occasional reflux.   PLAN:  1. Try Vivactil, samples of 10 mg tabs to try 1/2 q.h.s.  2. Retry BiPAP as discussed and talk with her home care company about      comfort issues.  3. Review of options for treating sleep apnea including weight loss and      discussion of her responsibility to drive safely.  4. Schedule return in 2 months, earlier p.r.n.     Clinton D. Maple Hudson, MD, Tonny Bollman, FACP  Electronically Signed    CDY/MedQ  DD: 11/30/2005  DT: 12/01/2005  Job #: [id number]

## 2010-06-12 NOTE — Assessment & Plan Note (Signed)
[facility]                             PULMONARY OFFICE NOTE   DINORA, HEMM                   MRN:          [id number]  DATE:02/04/2006                            DOB:          [birth year]    PROBLEM:  1. Obstructive sleep apnea.  2. Allergic rhinitis.  3. History of sinus surgeries.  4. Chronic fatigue and non-restorative sleep.  5. Esophageal reflux.  6. Hypothyroid.   HISTORY:  She had called January 24, 2006 very upset saying that she  was lying awake, and could tell by the she felt at night that her blood  pressure was up, blaming her sleep apnea.  Clearly quite stressed.  She  had previous sleep studies at [facility], and we discussed those.  I  told her overnight oximetry was normal, and we just did not have  evidence that her sleep apnea was extremely severe.  She comes today  complaining of head congestion, rhinorrhea, chest tightness, increased  shortness of breath and cough.  She says this is bronchitis, and she  blames allergy.  There has not been much sneezing or itching.  She  thinks there is mold in her home, but does not describe it in ways that  sound very significant.  She has been having more active reflux, so she  stopped virtually all of her medicines thinking that it has made it  worse.  Among these, she stopped her antihistamine.  She sleeps with  head of bed elevated.  Dr. Matthias Hughs has helped with GI in the past.   MEDICATIONS:  1. Atenolol 75 mg.  2. Evista 60 mg.  3. Lisinopril 5 mg.  4. Patanol lubricant eye drops.  5. Zyrtec.  6. Synthroid 50 mcg.  7. Acidophilus.  8. Calcium.  9. Multivitamins.  10.Flonase.  11.BIPAP is set at inspiratory 9, expiratory 6, but is not being used      very regularly now.   Drug intolerance to:  1. HYDROCHLOROTHIAZIDE.  2. CODEINE.  3 SUMYCIN.  1. __________  2. NOVOCAIN.  3. OXYCODONE.  4. NEXIUM.  5. PRILOSEC.  6. KETEK.  7. TRIPLE CARE SALVE.   She is  doing some saline nasal lavage, which she says occasionally  brings out some yellow.  There has been no chest pain or blood.  No  fever or adenopathy.  She does not report significant daytime  sleepiness.   OBJECTIVE:  Weight 149 pounds.  BP 138/80.  Pulse regular 81.  Room air  saturation 95%.  Pressured speech, and on 1 or 2 occasions she had a distinct inspiratory  stridorous vocal cord dysfunction-type of noise that relaxed with a  distraction.  LUNGS:  Were otherwise clear.  She did have some dry cough.  Pharynx is not red or irritated.  HEART:  Sounds were regular.   We reviewed her [facility] sleep studies.  She had an apnea/hypopnea  index ranging between 19 and 20 or so on some studies there, and had  been titrated on April 04, 2005 to a BIPAP of inspiratory 10, expiratory  6.  She has been  set at home on inspiratory 9, expiratory 6.  She says  she had dental evaluation there, and teeth will not tolerate an oral  appliance.  She is not willing to consider surgical evaluation.  We  agreed she might be happier using conservative measures such as sleep  off the flat of her back rather than struggling any longer with BIPAP,  which she just does not keep on.   EXAMINATION:  She has watery eyes and nose.  We discussed the  distinction between viral and allergic disorders.   IMPRESSION:  1. Rhinitis and bronchitis.  2. Obstructive sleep apnea.  3. Significant anxiety component.  4. Significant gastroesophageal reflux disease.   PLAN:  1. I emphasized reflux precautions and asked her to get back with her      GI physician.  2. Sample of Astelin once or twice each nostril b.i.d. p.r.n.  3. Sample Optivar 1 drop each eye daily.  4. She declined Depo shot after discussion.  5. Supportive care for sleep apnea.  She is not overweight, but she      will try to sleep off her back.  If she just cannot tolerate BIPAP,      we may have to give up on it, but I have asked her to keep  trying.  6. Schedule return in 6 months, earlier p.r.n.     Clinton D. Maple Hudson, MD, Tonny Bollman, FACP  Electronically Signed   CDY/MedQ  DD: 02/04/2006  DT: 02/04/2006  Job #: [id number]

## 2010-12-16 ENCOUNTER — Emergency Department: Payer: Self-pay | Admitting: Internal Medicine

## 2011-03-17 ENCOUNTER — Telehealth: Payer: Self-pay | Admitting: Internal Medicine

## 2011-03-17 NOTE — Telephone Encounter (Signed)
Pt called to get new pt appointment gave her 04/28/11.  Pt stated that her bp was running high.  Pt does have primary care dr at [facility] with laurie she recommended that pt go to primary care dr till we can get her in here.

## 2011-04-18 ENCOUNTER — Ambulatory Visit: Payer: Self-pay | Admitting: Internal Medicine

## 2011-04-28 ENCOUNTER — Encounter: Payer: Self-pay | Admitting: Internal Medicine

## 2011-04-28 ENCOUNTER — Ambulatory Visit (INDEPENDENT_AMBULATORY_CARE_PROVIDER_SITE_OTHER): Payer: Medicare Other | Admitting: Internal Medicine

## 2011-04-28 VITALS — BP 118/52 | HR 71 | Temp 98.0°F | Ht 62.0 in | Wt 135.0 lb

## 2011-04-28 DIAGNOSIS — E039 Hypothyroidism, unspecified: Secondary | ICD-10-CM

## 2011-04-28 DIAGNOSIS — J42 Unspecified chronic bronchitis: Secondary | ICD-10-CM

## 2011-04-28 DIAGNOSIS — J309 Allergic rhinitis, unspecified: Secondary | ICD-10-CM

## 2011-04-28 DIAGNOSIS — J209 Acute bronchitis, unspecified: Secondary | ICD-10-CM

## 2011-04-28 DIAGNOSIS — Z1239 Encounter for other screening for malignant neoplasm of breast: Secondary | ICD-10-CM

## 2011-04-28 MED ORDER — BUDESONIDE-FORMOTEROL FUMARATE 160-4.5 MCG/ACT IN AERO: 2.0000 | INHALATION_SPRAY | Freq: Two times a day (BID) | RESPIRATORY_TRACT | Status: DC

## 2011-04-28 MED ORDER — LEVOFLOXACIN 500 MG PO TABS: 500.0000 mg | ORAL_TABLET | Freq: Every day | ORAL | Status: AC

## 2011-04-28 NOTE — Assessment & Plan Note (Addendum)
Will get records as to previous evaluation and management. Will check TSH on recent lab work. Will continue Levothyroxine. Followup in one month.

## 2011-04-28 NOTE — Progress Notes (Signed)
Subjective:    Patient ID: Yvonne Davidson, female    DOB: 11-05-1940, 71 y.o.   MRN: [id number]  HPI 71 year old female with history of severe allergies and recurrent episodes of bronchitis presents to establish care. She reports that for years she has had severe allergies to mold. She notes that she recently moved out of her home and is living with a friend because of mold in her home. She reports a recent episode of bronchitis, symptomatic of coughing, wheezing, and shortness of breath. She notes that she was treated by her previous primary care physician with amoxicillin and prednisone taper. She notes no improvement with these interventions. She also has albuterol to use as needed but only uses this on rare occasion. She has never been on long-acting bronchodilators. She has never taken inhaled steroids. She denies any known history of asthma, however is a former smoker. She denies any fever or chills. Her cough is generally nonproductive. She is currently using Zyrtec to help with allergies with no improvement.  She also notes a history of thyroid disease. 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 She was seen by a local endocrinologist but felt uncomfortable with his care, so currently drives to [facility] to see a physician that specializes in endocrinology with more preventative focus.  Outpatient Encounter Prescriptions as of 04/28/2011  Medication Sig Dispense Refill  . albuterol (PROVENTIL HFA;VENTOLIN HFA) 108 (90 BASE) MCG/ACT inhaler Inhale 2 puffs into the lungs every 6 (six) hours as needed.      Marland Kitchen atenolol (TENORMIN) 25 MG tablet Take 75 mg by mouth at bedtime.      . carboxymethylcellulose (REFRESH PLUS) 0.5 % SOLN 1 drop 3 (three) times daily as needed.      . cetirizine (ZYRTEC) 10 MG tablet Take 10 mg by mouth daily.      . Levothyroxine Sodium (TIROSINT) 13 MCG CAPS Take 1 capsule by mouth daily.      Marland Kitchen losartan (COZAAR) 100 MG tablet Take  100 mg by mouth daily.      . Pancrelipase, Lip-Prot-Amyl, (CREON) 24000 UNITS CPEP Take 1 capsule by mouth 3 (three) times daily before meals.      Marland Kitchen PRESCRIPTION MEDICATION Compounded Nose liquid antibiotic      . budesonide-formoterol (SYMBICORT) 160-4.5 MCG/ACT inhaler Inhale 2 puffs into the lungs 2 (two) times daily.  1 Inhaler  3  . levofloxacin (LEVAQUIN) 500 MG tablet Take 1 tablet (500 mg total) by mouth daily.  7 tablet  0    Review of Systems  Constitutional: Positive for fatigue. Negative for fever, chills, appetite change and unexpected weight change.  HENT: Positive for congestion, sneezing, postnasal drip and sinus pressure. Negative for ear pain, sore throat, trouble swallowing, neck pain and voice change.   Eyes: Negative for visual disturbance.  Respiratory: Positive for cough, shortness of breath and wheezing. Negative for stridor.   Cardiovascular: Negative for chest pain, palpitations and leg swelling.  Gastrointestinal: Negative for nausea, vomiting, abdominal pain, diarrhea, constipation, blood in stool, abdominal distention and anal bleeding.  Genitourinary: Negative for dysuria and flank pain.  Musculoskeletal: Negative for myalgias, arthralgias and gait problem.  Skin: Negative for color change and rash.  Neurological: Negative for dizziness and headaches.  Hematological: Negative for adenopathy. Does not bruise/bleed easily.  Psychiatric/Behavioral: Negative for suicidal ideas, sleep disturbance and dysphoric mood. The patient is not nervous/anxious.    BP 118/52  Pulse 71  Temp(Src) 98 F (36.7 C) (Oral)  Ht 5\' 2"  (1.575 m)  Wt 135 lb (61.236 kg)  BMI 24.69 kg/m2  SpO2 98%     Objective:   Physical Exam  Constitutional: She is oriented to person, place, and time. She appears well-developed and well-nourished. No distress.  HENT:  Head: Normocephalic and atraumatic.  Right Ear: External ear normal.  Left Ear: External ear normal.  Nose: Nose normal.    Mouth/Throat: Oropharynx is clear and moist. No oropharyngeal exudate.  Eyes: Conjunctivae are normal. Pupils are equal, round, and reactive to light. Right eye exhibits no discharge. Left eye exhibits no discharge. No scleral icterus.  Neck: Normal range of motion. Neck supple. No tracheal deviation present. No thyromegaly present.  Cardiovascular: Normal rate, regular rhythm, normal heart sounds and intact distal pulses.  Exam reveals no gallop and no friction rub.   No murmur heard. Pulmonary/Chest: No accessory muscle usage. Tachypnea (with minimal exertion) noted. No respiratory distress. She has decreased breath sounds (prolonged expiration). She has wheezes. She has no rhonchi. She has no rales. She exhibits no tenderness.  Musculoskeletal: Normal range of motion. She exhibits no edema and no tenderness.  Lymphadenopathy:    She has no cervical adenopathy.  Neurological: She is alert and oriented to person, place, and time. No cranial nerve deficit. She exhibits normal muscle tone. Coordination normal.  Skin: Skin is warm and dry. No rash noted. She is not diaphoretic. No erythema. No pallor.  Psychiatric: She has a normal mood and affect. Her behavior is normal. Judgment and thought content normal.          Assessment & Plan:

## 2011-04-28 NOTE — Assessment & Plan Note (Signed)
Symptoms and exam are most consistent with chronic bronchitis with acute exacerbation. Patient has smoking history and likely has underlying COPD. Will get chest x-ray. Will change antibiotics to Levaquin. Will add inhaled bronchodilator and steroid. Will set patient up for evaluation with pulmonary medicine.

## 2011-04-28 NOTE — Assessment & Plan Note (Signed)
Per patient, symptoms are severe. Will obtain records on previous evaluation and management. We'll continue Zyrtec.

## 2011-04-29 ENCOUNTER — Ambulatory Visit (INDEPENDENT_AMBULATORY_CARE_PROVIDER_SITE_OTHER)
Admission: RE | Admit: 2011-04-29 | Discharge: 2011-04-29 | Disposition: A | Discharge status: Home / Self Care | Payer: Medicare Other | Source: Ambulatory Visit | Attending: Internal Medicine | Admitting: Internal Medicine

## 2011-04-29 DIAGNOSIS — J42 Unspecified chronic bronchitis: Secondary | ICD-10-CM

## 2011-04-30 ENCOUNTER — Telehealth: Payer: Self-pay | Admitting: *Deleted

## 2011-04-30 MED ORDER — ONE FLOW SPIROMETER DEVI: Status: DC

## 2011-04-30 NOTE — Telephone Encounter (Signed)
Incentive spirometer requested by pt, OK per md, rx sent

## 2011-05-12 ENCOUNTER — Ambulatory Visit: Payer: Self-pay | Admitting: Internal Medicine

## 2011-05-31 ENCOUNTER — Ambulatory Visit: Payer: Medicare Other | Admitting: Internal Medicine

## 2011-06-09 ENCOUNTER — Institutional Professional Consult (permissible substitution): Payer: Medicare Other | Admitting: Pulmonary Disease

## 2011-06-16 ENCOUNTER — Ambulatory Visit: Payer: Self-pay | Admitting: Family Medicine

## 2011-06-23 ENCOUNTER — Ambulatory Visit: Payer: Medicare Other | Admitting: Internal Medicine

## 2011-07-06 ENCOUNTER — Ambulatory Visit: Payer: Medicare Other | Admitting: Internal Medicine

## 2011-07-26 ENCOUNTER — Ambulatory Visit: Payer: Self-pay | Admitting: Cardiology

## 2011-08-03 ENCOUNTER — Ambulatory Visit: Payer: Medicare Other | Admitting: Pulmonary Disease

## 2011-08-23 ENCOUNTER — Ambulatory Visit (INDEPENDENT_AMBULATORY_CARE_PROVIDER_SITE_OTHER): Payer: Medicare Other | Admitting: Internal Medicine

## 2011-08-23 ENCOUNTER — Telehealth: Payer: Self-pay | Admitting: Internal Medicine

## 2011-08-23 ENCOUNTER — Encounter: Payer: Self-pay | Admitting: Internal Medicine

## 2011-08-23 VITALS — BP 120/70 | HR 67 | Temp 98.1°F | Ht 62.0 in | Wt 134.0 lb

## 2011-08-23 DIAGNOSIS — J42 Unspecified chronic bronchitis: Secondary | ICD-10-CM

## 2011-08-23 DIAGNOSIS — J209 Acute bronchitis, unspecified: Secondary | ICD-10-CM

## 2011-08-23 DIAGNOSIS — E039 Hypothyroidism, unspecified: Secondary | ICD-10-CM

## 2011-08-23 MED ORDER — PREDNISONE (PAK) 10 MG PO TABS: ORAL_TABLET | ORAL | Status: AC

## 2011-08-23 MED ORDER — ALBUTEROL SULFATE HFA 108 (90 BASE) MCG/ACT IN AERS: 2.0000 | INHALATION_SPRAY | Freq: Four times a day (QID) | RESPIRATORY_TRACT | Status: DC | PRN

## 2011-08-23 MED ORDER — AZITHROMYCIN 250 MG PO TABS: ORAL_TABLET | ORAL | Status: AC

## 2011-08-23 NOTE — Progress Notes (Signed)
Subjective:    Patient ID: Yvonne Davidson, female    DOB: [birth year], 71 y.o.   MRN: [id number]  HPI 71 year old female with history of hypertension, asthma presents for followup. Her primary concern today is a 4 days of shortness of breath and chest tightness. She reports that this first began when she went back to her old apartment which she knows is infected with mold. She reports that shortly after that she developed chest tightness and shortness of breath. She did not have her rescue inhaler. She has also noticed occasional dry cough and some wheezing. She reports full compliance with her Symbicort. She denies any fever, chills.   Outpatient Encounter Prescriptions as of 08/23/2011  Medication Sig Dispense Refill  . amLODipine (NORVASC) 5 MG tablet Take 5 mg by mouth daily.      Marland Kitchen atenolol (TENORMIN) 25 MG tablet Take 75 mg by mouth at bedtime.      . beclomethasone (QVAR) 80 MCG/ACT inhaler Inhale 1 puff into the lungs 2 (two) times daily.      . carboxymethylcellulose (REFRESH PLUS) 0.5 % SOLN 1 drop 3 (three) times daily as needed.      . cetirizine (ZYRTEC) 10 MG tablet Take 10 mg by mouth daily.      . cholestyramine (QUESTRAN) 4 GM/DOSE powder Take 4 g by mouth 3 (three) times daily with meals.      . cloNIDine (CATAPRES) 0.1 MG tablet Take 0.1 mg by mouth 2 (two) times daily.      Marland Kitchen lisinopril (PRINIVIL,ZESTRIL) 40 MG tablet Take 40 mg by mouth daily.      Marland Kitchen losartan (COZAAR) 100 MG tablet Take 100 mg by mouth daily.      . mupirocin cream (BACTROBAN) 2 % Apply 1 application topically 3 (three) times daily.      . NON FORMULARY T3 & T4 compounded      . Pancrelipase, Lip-Prot-Amyl, (CREON) 24000 UNITS CPEP Take 1 capsule by mouth 3 (three) times daily before meals.      Marland Kitchen PRESCRIPTION MEDICATION Compounded Nose liquid antibiotic      . Respiratory Therapy Supplies (ONE FLOW SPIROMETER) DEVI Please provide incentive spirometer DX: Asthma  1 each  0  . albuterol (PROVENTIL  HFA;VENTOLIN HFA) 108 (90 BASE) MCG/ACT inhaler Inhale 2 puffs into the lungs every 6 (six) hours as needed.  18 g  3  . azithromycin (ZITHROMAX Z-PAK) 250 MG tablet Take 2 pills day 1, then 1 pill daily days 2-5  6 each  0  . budesonide-formoterol (SYMBICORT) 160-4.5 MCG/ACT inhaler Inhale 2 puffs into the lungs 2 (two) times daily.  1 Inhaler  3  . predniSONE (STERAPRED UNI-PAK) 10 MG tablet Take 60mg  day 1 then taper by 10mg  daily  21 tablet  0  . DISCONTD: albuterol (PROVENTIL HFA;VENTOLIN HFA) 108 (90 BASE) MCG/ACT inhaler Inhale 2 puffs into the lungs every 6 (six) hours as needed.      Marland Kitchen DISCONTD: Levothyroxine Sodium (TIROSINT) 13 MCG CAPS Take 1 capsule by mouth daily.         Review of Systems  Constitutional: Positive for fatigue. Negative for fever, chills, appetite change and unexpected weight change.  HENT: Negative for ear pain, congestion, sore throat, trouble swallowing, neck pain, voice change and sinus pressure.   Eyes: Negative for visual disturbance.  Respiratory: Positive for cough, shortness of breath and wheezing. Negative for stridor.   Cardiovascular: Negative for chest pain, palpitations and leg swelling.  Gastrointestinal: Negative for nausea,  vomiting, abdominal pain, diarrhea, constipation, blood in stool, abdominal distention and anal bleeding.  Genitourinary: Negative for dysuria and flank pain.  Musculoskeletal: Negative for myalgias, arthralgias and gait problem.  Skin: Negative for color change and rash.  Neurological: Negative for dizziness and headaches.  Hematological: Negative for adenopathy. Does not bruise/bleed easily.  Psychiatric/Behavioral: Negative for suicidal ideas, disturbed wake/sleep cycle and dysphoric mood. The patient is not nervous/anxious.        Objective:   Physical Exam  Constitutional: She is oriented to person, place, and time. She appears well-developed and well-nourished. No distress.  HENT:  Head: Normocephalic and  atraumatic.  Right Ear: External ear normal.  Left Ear: External ear normal.  Nose: Nose normal.  Mouth/Throat: Oropharynx is clear and moist. No oropharyngeal exudate.  Eyes: Conjunctivae are normal. Pupils are equal, round, and reactive to light. Right eye exhibits no discharge. Left eye exhibits no discharge. No scleral icterus.  Neck: Normal range of motion. Neck supple. No tracheal deviation present. No thyromegaly present.  Cardiovascular: Normal rate, regular rhythm, normal heart sounds and intact distal pulses.  Exam reveals no gallop and no friction rub.   No murmur heard. Pulmonary/Chest: Effort normal. No accessory muscle usage. Not tachypneic. No respiratory distress. She has decreased breath sounds. She has wheezes. She has no rales. She exhibits no tenderness.  Musculoskeletal: Normal range of motion. She exhibits no edema and no tenderness.  Lymphadenopathy:    She has no cervical adenopathy.  Neurological: She is alert and oriented to person, place, and time. No cranial nerve deficit. She exhibits normal muscle tone. Coordination normal.  Skin: Skin is warm and dry. No rash noted. She is not diaphoretic. No erythema. No pallor.  Psychiatric: She has a normal mood and affect. Her behavior is normal. Judgment and thought content normal.          Assessment & Plan:

## 2011-08-23 NOTE — Assessment & Plan Note (Signed)
Symptoms are consistent with recurrent exacerbation of chronic bronchitis. Trigger appears to be exposure to mold again. Will start prednisone taper and azithromycin. Patient will use inhaled albuterol as needed. She will continue Symbicort. Followup one month.

## 2011-08-23 NOTE — Telephone Encounter (Signed)
Left message on cell phone voicemail for patient to return call. 

## 2011-08-23 NOTE — Progress Notes (Signed)
Rx's for Prednisone and Zpak called to [facility] pharmacy.

## 2011-08-23 NOTE — Assessment & Plan Note (Signed)
Patient reports history of hypothyroidism. Will check TSH with labs prior to next appointment.

## 2011-08-23 NOTE — Telephone Encounter (Signed)
Can you call patient and confirm that she has tolerated azithromycin in the past. We started this today for bronchitis but she had allergy listed to similar medication.

## 2011-08-23 NOTE — Progress Notes (Signed)
Rx for Albuterol called to [facility] pharmacy, did not go through via e-scribe.

## 2011-08-24 NOTE — Telephone Encounter (Signed)
Spoke with patient and she stated that she thinks she has taken this medication in the past and she is on day three of the antibiotic and she feels ok.

## 2011-10-05 ENCOUNTER — Other Ambulatory Visit: Payer: Self-pay | Admitting: *Deleted

## 2011-10-05 MED ORDER — LISINOPRIL 40 MG PO TABS: 40.0000 mg | ORAL_TABLET | Freq: Every day | ORAL | Status: DC

## 2011-10-14 ENCOUNTER — Encounter: Payer: Self-pay | Admitting: Internal Medicine

## 2011-10-14 ENCOUNTER — Ambulatory Visit (INDEPENDENT_AMBULATORY_CARE_PROVIDER_SITE_OTHER): Payer: Medicare Other | Admitting: Internal Medicine

## 2011-10-14 VITALS — BP 140/80 | HR 59 | Temp 98.7°F | Ht 62.0 in | Wt 137.5 lb

## 2011-10-14 DIAGNOSIS — F4323 Adjustment disorder with mixed anxiety and depressed mood: Secondary | ICD-10-CM | POA: Insufficient documentation

## 2011-10-14 DIAGNOSIS — F419 Anxiety disorder, unspecified: Secondary | ICD-10-CM

## 2011-10-14 DIAGNOSIS — E063 Autoimmune thyroiditis: Secondary | ICD-10-CM

## 2011-10-14 DIAGNOSIS — F411 Generalized anxiety disorder: Secondary | ICD-10-CM

## 2011-10-14 MED ORDER — ALPRAZOLAM 0.25 MG PO TABS: 0.2500 mg | ORAL_TABLET | Freq: Three times a day (TID) | ORAL | Status: DC | PRN

## 2011-10-14 NOTE — Assessment & Plan Note (Signed)
Recent worsening of anxiety related to thyroid dysfunction. Will use Xanax as needed to help with symptoms. Followup one month.

## 2011-10-14 NOTE — Progress Notes (Signed)
Subjective:    Patient ID: Yvonne Davidson, female    DOB: April 07, 1940, 71 y.o.   MRN: [id number]  HPI 71 year old female with history of Hashimoto's thyroiditis presents for acute visit complaining of recent worsening of anxiety, fatigue, and hair loss. She reports she was seen by her local endocrinologist who performed lab work and thyroid ultrasound which were reportedly normal. She recommended continued monitoring. Patient reports feeling frustrated by this. She is interested in pursuing further treatment. She reports significant limitation in her ability to function because of ongoing symptoms. She denies palpitations, sweating, hot flashes. She denies chest pain or shortness of breath. She notes significant increase in anxiety and difficulty sleeping. She also feels that she may have developed sleep apnea as a result of thyroiditis. She reports increased snoring at night over the last few months. She will use to wear CPAP but no longer uses this machine.  Outpatient Encounter Prescriptions as of 10/14/2011  Medication Sig Dispense Refill  . albuterol (PROVENTIL HFA;VENTOLIN HFA) 108 (90 BASE) MCG/ACT inhaler Inhale 2 puffs into the lungs every 6 (six) hours as needed.  18 g  3  . amLODipine (NORVASC) 5 MG tablet Take 5 mg by mouth daily.      Marland Kitchen atenolol (TENORMIN) 25 MG tablet Take 75 mg by mouth at bedtime.      . beclomethasone (QVAR) 80 MCG/ACT inhaler Inhale 1 puff into the lungs 2 (two) times daily.      . budesonide-formoterol (SYMBICORT) 160-4.5 MCG/ACT inhaler Inhale 2 puffs into the lungs 2 (two) times daily.  1 Inhaler  3  . carboxymethylcellulose (REFRESH PLUS) 0.5 % SOLN 1 drop 3 (three) times daily as needed.      . cetirizine (ZYRTEC) 10 MG tablet Take 10 mg by mouth daily.      . cholestyramine (QUESTRAN) 4 GM/DOSE powder Take 4 g by mouth 3 (three) times daily with meals.      . cloNIDine (CATAPRES) 0.1 MG tablet Take 0.1 mg by mouth 2 (two) times daily.      Marland Kitchen lisinopril  (PRINIVIL,ZESTRIL) 40 MG tablet Take 1 tablet (40 mg total) by mouth daily.  90 tablet  3  . losartan (COZAAR) 100 MG tablet Take 100 mg by mouth daily.      . mupirocin cream (BACTROBAN) 2 % Apply 1 application topically 3 (three) times daily.      . NON FORMULARY T3 & T4 compounded      . Pancrelipase, Lip-Prot-Amyl, (CREON) 24000 UNITS CPEP Take 1 capsule by mouth 3 (three) times daily before meals.      Marland Kitchen PRESCRIPTION MEDICATION Compounded Nose liquid antibiotic      . Respiratory Therapy Supplies (ONE FLOW SPIROMETER) DEVI Please provide incentive spirometer DX: Asthma  1 each  0  . ALPRAZolam (XANAX) 0.25 MG tablet Take 1 tablet (0.25 mg total) by mouth 3 (three) times daily as needed for sleep or anxiety.  90 tablet  0   BP 140/80  Pulse 59  Temp 98.7 F (37.1 C) (Oral)  Ht 5\' 2"  (1.575 m)  Wt 137 lb 8 oz (62.37 kg)  BMI 25.15 kg/m2  SpO2 97%  Review of Systems  Constitutional: Positive for fatigue. Negative for fever, chills, appetite change and unexpected weight change.  HENT: Negative for ear pain, congestion, sore throat, trouble swallowing, neck pain, voice change and sinus pressure.   Eyes: Negative for visual disturbance.  Respiratory: Negative for cough, shortness of breath, wheezing and stridor.   Cardiovascular:  Negative for chest pain, palpitations and leg swelling.  Gastrointestinal: Negative for nausea, vomiting, abdominal pain, diarrhea, constipation, blood in stool, abdominal distention and anal bleeding.  Genitourinary: Negative for dysuria and flank pain.  Musculoskeletal: Negative for myalgias, arthralgias and gait problem.  Skin: Negative for color change and rash.  Neurological: Negative for dizziness and headaches.  Hematological: Negative for adenopathy. Does not bruise/bleed easily.  Psychiatric/Behavioral: Negative for suicidal ideas, disturbed wake/sleep cycle and dysphoric mood. The patient is nervous/anxious.        Objective:   Physical Exam    Constitutional: She is oriented to person, place, and time. She appears well-developed and well-nourished. No distress.  HENT:  Head: Normocephalic and atraumatic.  Right Ear: External ear normal.  Left Ear: External ear normal.  Nose: Nose normal.  Mouth/Throat: Oropharynx is clear and moist. No oropharyngeal exudate.  Eyes: Conjunctivae normal are normal. Pupils are equal, round, and reactive to light. Right eye exhibits no discharge. Left eye exhibits no discharge. No scleral icterus.  Neck: Normal range of motion. Neck supple. No tracheal deviation present. No thyromegaly present.  Cardiovascular: Normal rate, regular rhythm, normal heart sounds and intact distal pulses.  Exam reveals no gallop and no friction rub.   No murmur heard. Pulmonary/Chest: Effort normal and breath sounds normal. No respiratory distress. She has no wheezes. She has no rales. She exhibits no tenderness.  Musculoskeletal: Normal range of motion. She exhibits no edema and no tenderness.  Lymphadenopathy:    She has no cervical adenopathy.  Neurological: She is alert and oriented to person, place, and time. No cranial nerve deficit. She exhibits normal muscle tone. Coordination normal.  Skin: Skin is warm and dry. No rash noted. She is not diaphoretic. No erythema. No pallor.  Psychiatric: Her behavior is normal. Judgment and thought content normal. Her mood appears anxious. Her speech is rapid and/or pressured. Cognition and memory are normal. She exhibits a depressed mood.          Assessment & Plan:

## 2011-10-14 NOTE — Assessment & Plan Note (Signed)
Recent worsening of symptoms with hair loss and increased anxiety. Patient was seen by local endocrinologist who recommended continued monitoring. We'll try to obtain records on this evaluation including lab work. We'll also set up a second opinion with endocrinology at [facility].

## 2011-10-21 ENCOUNTER — Telehealth: Payer: Self-pay | Admitting: Internal Medicine

## 2011-10-21 NOTE — Telephone Encounter (Signed)
Pt called to get refill on her Pt is out of her losartan potassium 100mg    90 pills  Take 1 daily  Clonidine   hcl  0.1mg  60 pills  Take  By mouth twice daily Atenolol 25mg  270 pills  Take 3 daily  [facility] [phone]  Please advise pt when these are called

## 2011-10-22 MED ORDER — LOSARTAN POTASSIUM 100 MG PO TABS: 100.0000 mg | ORAL_TABLET | Freq: Every day | ORAL | Status: DC

## 2011-10-22 MED ORDER — ATENOLOL 25 MG PO TABS: 75.0000 mg | ORAL_TABLET | Freq: Every day | ORAL | Status: DC

## 2011-10-22 MED ORDER — CLONIDINE HCL 0.1 MG PO TABS: 0.1000 mg | ORAL_TABLET | Freq: Two times a day (BID) | ORAL | Status: DC

## 2011-12-09 DIAGNOSIS — K859 Acute pancreatitis without necrosis or infection, unspecified: Secondary | ICD-10-CM | POA: Insufficient documentation

## 2011-12-09 DIAGNOSIS — I10 Essential (primary) hypertension: Secondary | ICD-10-CM | POA: Insufficient documentation

## 2011-12-09 DIAGNOSIS — E0500 Thyrotoxicosis with diffuse goiter without thyrotoxic crisis or storm: Secondary | ICD-10-CM | POA: Insufficient documentation

## 2011-12-09 DIAGNOSIS — E782 Mixed hyperlipidemia: Secondary | ICD-10-CM | POA: Insufficient documentation

## 2011-12-09 DIAGNOSIS — F329 Major depressive disorder, single episode, unspecified: Secondary | ICD-10-CM | POA: Insufficient documentation

## 2011-12-09 DIAGNOSIS — R7303 Prediabetes: Secondary | ICD-10-CM | POA: Insufficient documentation

## 2011-12-09 DIAGNOSIS — F32A Depression, unspecified: Secondary | ICD-10-CM | POA: Insufficient documentation

## 2011-12-09 DIAGNOSIS — G4733 Obstructive sleep apnea (adult) (pediatric): Secondary | ICD-10-CM | POA: Insufficient documentation

## 2011-12-09 DIAGNOSIS — F419 Anxiety disorder, unspecified: Secondary | ICD-10-CM | POA: Insufficient documentation

## 2011-12-09 DIAGNOSIS — E785 Hyperlipidemia, unspecified: Secondary | ICD-10-CM | POA: Insufficient documentation

## 2011-12-09 DIAGNOSIS — M1990 Unspecified osteoarthritis, unspecified site: Secondary | ICD-10-CM | POA: Insufficient documentation

## 2011-12-09 DIAGNOSIS — G4730 Sleep apnea, unspecified: Secondary | ICD-10-CM | POA: Insufficient documentation

## 2011-12-22 ENCOUNTER — Other Ambulatory Visit: Payer: Self-pay | Admitting: Internal Medicine

## 2011-12-22 MED ORDER — CHOLESTYRAMINE 4 GM/DOSE PO POWD: 4.0000 g | Freq: Three times a day (TID) | ORAL | Status: DC

## 2011-12-22 NOTE — Telephone Encounter (Signed)
Pt is needing a refill on Cholestyramine shes uses [facility]

## 2011-12-22 NOTE — Telephone Encounter (Signed)
Med filled.  

## 2012-02-14 ENCOUNTER — Encounter: Payer: Self-pay | Admitting: Internal Medicine

## 2012-02-14 ENCOUNTER — Ambulatory Visit (INDEPENDENT_AMBULATORY_CARE_PROVIDER_SITE_OTHER): Payer: Medicare Other | Admitting: Internal Medicine

## 2012-02-14 VITALS — BP 116/64 | HR 56 | Temp 98.1°F | Ht 62.0 in | Wt 143.8 lb

## 2012-02-14 DIAGNOSIS — G4730 Sleep apnea, unspecified: Secondary | ICD-10-CM

## 2012-02-14 DIAGNOSIS — I10 Essential (primary) hypertension: Secondary | ICD-10-CM

## 2012-02-14 DIAGNOSIS — J45909 Unspecified asthma, uncomplicated: Secondary | ICD-10-CM | POA: Insufficient documentation

## 2012-02-14 NOTE — Assessment & Plan Note (Signed)
Pt reports h/o asthma, however, on review of previous pulmonary notes, I see no mention of this. Has been on Symbicort and Qvar from former PCP. Unclear why this choice of meds. No records on previous PFTs. Will set up repeat pulmonary evaluation. Question if she might benefit from having repeat PFTs.

## 2012-02-14 NOTE — Assessment & Plan Note (Signed)
BP Readings from Last 3 Encounters:  02/14/12 116/64  10/14/11 140/80  08/23/11 120/70    BP elevated during nighttime per pt report, but normal during daytime.  She attributes this to sleep apnea. Suspect that significant anxiety playing a role in elevated BP. Reluctant to increase BP meds, as BP has been well controlled here. Per [facility] notes, she was ruled out for pheochromocytoma as source of variable BP. Will continue to monitor for now. Address pt concern about sleep apnea. Continue alprazolam for anxiety.

## 2012-02-14 NOTE — Assessment & Plan Note (Addendum)
Pt reports h/o obstructive sleep apnea. Unable to tolerate CPAP in the past because of issues with face mask (see notes from Dr. [facility] 04/22/2006).  Currently having some night-time wakefulness and hypertension which she attributes to uncontrolled sleep apnea. Would like to re-establish care with pulmonologist to see if she needs to have repeat sleep study and if there might be new mask that would be more appropriate for her. Will set up referral with Dr. Kendrick Fries.

## 2012-02-14 NOTE — Progress Notes (Signed)
Subjective:    Patient ID: Yvonne Davidson, female    DOB: [birth year], 72 y.o.   MRN: [id number]  HPI 72 year old female with history of hypertension, anxiety, and sleep apnea presents for acute visit. She is very concerned today about elevated blood pressure. She reports that she has been waking frequently during the night been checking her blood pressure. It has been elevated, as high as 180s over 100s (during the day, BP reportedly normal). She attributes elevated BP to poorly controlled sleep apnea. She reports that she was on CPAP and BiPAP in the past but was unable to tolerate this because of mask. She has not been seen by pulmonary medicine in 2 years. She also reports a history of asthma for which she has been using Symbicort and Qvar. She reports some symptoms of shortness of breath and cough intermittently. She is not a smoker. She reports significant allergies to mold. Furthermore, she reports that she has significant infection in her abdomen with "yeast and H. pylori "which are contributing to her poor health. She would like followup testing for these conditions. She would also like to discuss potential diet that can help her "eliminate yeast from her abdomen."   Outpatient Encounter Prescriptions as of 02/14/2012  Medication Sig Dispense Refill  . albuterol (PROVENTIL HFA;VENTOLIN HFA) 108 (90 BASE) MCG/ACT inhaler Inhale 2 puffs into the lungs every 6 (six) hours as needed.  18 g  3  . ALPRAZolam (XANAX) 0.25 MG tablet Take 1 tablet (0.25 mg total) by mouth 3 (three) times daily as needed for sleep or anxiety.  90 tablet  0  . amLODipine (NORVASC) 5 MG tablet Take 5 mg by mouth daily.      Marland Kitchen atenolol (TENORMIN) 25 MG tablet Take 3 tablets (75 mg total) by mouth at bedtime.  270 tablet  1  . carboxymethylcellulose (REFRESH PLUS) 0.5 % SOLN 1 drop 3 (three) times daily as needed.      . cetirizine (ZYRTEC) 10 MG tablet Take 10 mg by mouth daily as needed.       . cholestyramine  (QUESTRAN) 4 GM/DOSE powder Take 4 g by mouth daily.      . cloNIDine (CATAPRES) 0.1 MG tablet Take 1 tablet (0.1 mg total) by mouth 2 (two) times daily.  60 tablet  1  . lisinopril (PRINIVIL,ZESTRIL) 40 MG tablet Take 1 tablet (40 mg total) by mouth daily.  90 tablet  3  . losartan (COZAAR) 100 MG tablet Take 1 tablet (100 mg total) by mouth daily.  90 tablet  1  . Pancrelipase, Lip-Prot-Amyl, (CREON) 24000 UNITS CPEP Take 1 capsule by mouth 3 (three) times daily before meals.      Marland Kitchen PRESCRIPTION MEDICATION as needed. Compounded Nose liquid antibiotic      . Respiratory Therapy Supplies (ONE FLOW SPIROMETER) DEVI Please provide incentive spirometer DX: Asthma  1 each  0  . [DISCONTINUED] cholestyramine (QUESTRAN) 4 GM/DOSE powder Take 1 packet (4 g total) by mouth 3 (three) times daily with meals.  378 g  3  . beclomethasone (QVAR) 80 MCG/ACT inhaler Inhale 1 puff into the lungs 2 (two) times daily.      . budesonide-formoterol (SYMBICORT) 160-4.5 MCG/ACT inhaler Inhale 2 puffs into the lungs 2 (two) times daily.  1 Inhaler  3  . [DISCONTINUED] mupirocin cream (BACTROBAN) 2 % Apply 1 application topically 3 (three) times daily.      . [DISCONTINUED] NON FORMULARY T3 & T4 compounded  Review of Systems  Constitutional: Negative for fever, chills, appetite change, fatigue and unexpected weight change.  HENT: Negative for ear pain, congestion, sore throat, trouble swallowing, neck pain, voice change and sinus pressure.   Eyes: Negative for visual disturbance.  Respiratory: Positive for cough and shortness of breath. Negative for chest tightness, wheezing and stridor.   Cardiovascular: Negative for chest pain, palpitations and leg swelling.  Gastrointestinal: Negative for nausea, vomiting, abdominal pain, diarrhea, constipation, blood in stool, abdominal distention and anal bleeding.  Genitourinary: Negative for dysuria and flank pain.  Musculoskeletal: Negative for myalgias, arthralgias  and gait problem.  Skin: Negative for color change and rash.  Neurological: Negative for dizziness and headaches.  Hematological: Negative for adenopathy. Does not bruise/bleed easily.  Psychiatric/Behavioral: Negative for suicidal ideas, sleep disturbance and dysphoric mood. The patient is nervous/anxious.        Objective:   Physical Exam  Constitutional: She is oriented to person, place, and time. She appears well-developed and well-nourished. No distress.  HENT:  Head: Normocephalic and atraumatic.  Right Ear: External ear normal.  Left Ear: External ear normal.  Nose: Nose normal.  Mouth/Throat: Oropharynx is clear and moist. No oropharyngeal exudate.  Eyes: Conjunctivae normal are normal. Pupils are equal, round, and reactive to light. Right eye exhibits no discharge. Left eye exhibits no discharge. No scleral icterus.  Neck: Normal range of motion. Neck supple. No tracheal deviation present. No thyromegaly present.  Cardiovascular: Normal rate, regular rhythm, normal heart sounds and intact distal pulses.  Exam reveals no gallop and no friction rub.   No murmur heard. Pulmonary/Chest: Effort normal and breath sounds normal. No accessory muscle usage. Not tachypneic. No respiratory distress. She has no decreased breath sounds. She has no wheezes. She has no rhonchi. She has no rales. She exhibits no tenderness.  Musculoskeletal: Normal range of motion. She exhibits no edema and no tenderness.  Lymphadenopathy:    She has no cervical adenopathy.  Neurological: She is alert and oriented to person, place, and time. No cranial nerve deficit. She exhibits normal muscle tone. Coordination normal.  Skin: Skin is warm and dry. No rash noted. She is not diaphoretic. No erythema. No pallor.  Psychiatric: Her behavior is normal. Judgment and thought content normal. Her mood appears anxious. Her speech is rapid and/or pressured and tangential.          Assessment & Plan:

## 2012-02-15 ENCOUNTER — Ambulatory Visit (INDEPENDENT_AMBULATORY_CARE_PROVIDER_SITE_OTHER): Payer: Medicare Other | Admitting: Pulmonary Disease

## 2012-02-15 ENCOUNTER — Encounter: Payer: Self-pay | Admitting: Pulmonary Disease

## 2012-02-15 VITALS — BP 132/70 | HR 50 | Temp 98.0°F | Ht 63.0 in | Wt 144.1 lb

## 2012-02-15 DIAGNOSIS — J45909 Unspecified asthma, uncomplicated: Secondary | ICD-10-CM

## 2012-02-15 DIAGNOSIS — G4730 Sleep apnea, unspecified: Secondary | ICD-10-CM

## 2012-02-15 NOTE — Assessment & Plan Note (Addendum)
Yvonne Davidson is here to see [place] today primarily cancer the question as to whether or not she has asthma contributing to her untreated sleep apnea. She had no childhood symptoms of asthma and did not develop significant bronchitis until she had recurrent exposure to mold in 2012 through 2013. Since moving to a newly built townhouse in 2013 she has no longer experienced any symptoms of bronchitis, cough, chest tightness, or shortness of breath or wheezing.  Apparently she was told by an allergist in [facility] (not [facility]) that she had asthma. She states that she had a spirometry test that was abnormal and this led to this diagnosis.  Today her simple spirometry is completely normal and not suggestive of airway obstruction.  The symptoms that she experienced in 1610 could certainly be attributed to severe allergic rhinitis do to her known mold allergy. Based on the fact that she does not have any symptoms of asthma I seriously question this diagnosis. In order to put it to rest, she could have a methacholine challenge test. However she states that since she has been doing well she does not want to undergo this test.  Plan: -If she develops chest pain, chest tightness, shortness of breath, or cough then she should undergo a methacholine challenge testing to evaluate for asthma. -In the meantime I suggest no change to her medications -We will refer her to a sleep specialist as her primary concern is obstructive sleep apnea which has been unsuccessfully treated despite an extensive assessment and a variety of treatment modalities over the last 10-15 years.

## 2012-02-15 NOTE — Progress Notes (Signed)
Subjective:    Patient ID: Yvonne Davidson, female    DOB: 1940-05-29, 72 y.o.   MRN: [id number]  HPI  This is a very pleasant 72 year old female with obstructive sleep apnea who comes to our clinic today for evaluation of possible asthma. She smoked one half packs of cigarettes daily for 18 years and quit in 1976. She had no respiratory complaints or diagnoses as a child or during young adulthood. However she developed allergic rhinitis and adulthood and has been evaluated by multiple allergist for this. From about 2010 through 2013 she developed worsening sneezing, sinus congestion, and occasional dry cough. After multiple doctors visits and it least one round of prednisone and a diagnosis of "asthmatic bronchitis" it was felt that this illness was likely set off by exposure to mold and mildew in her home. She had her home tested and intact she did have a significant amount of mildew in the walls. She moved to a newly constructed townhouse in 2013 and has had minimal symptoms since. When she was exposed to the mold she did have occasional bronchitis symptoms with a dry cough and some chest tightness. This was always associated with sneezing and sinus congestion. Since living in a new house, she has had no chest tightness, shortness of breath, wheezing, or cough. Occasionally when she is exposed to mold she will develop sinus congestion and a dry cough but this is very rare. She is coming to see [place] today because she says that her primary problem is untreated obstructive sleep apnea and she is worried that she may have asthma contributing to this. Apparently she was told by a physician assistant in an allergist office in [facility] that she had asthma based on the results of simple spirometry.  She has been extensively evaluated for obstructive sleep apnea over the years and has undergone a total of 6 polysomnogram. Her most recent polysomnogram was in July 2013 and was not consistent with  obstructive sleep apnea that was suggestive of upper airway resistance syndrome. She has been treated with CPAP but has been intolerant to it because of abdominal bloating. She has also failed oral appliance therapy because she didn't like the taste of left in her mouth.   Past Medical History  Diagnosis Date  . Chronic sinusitis   . Hashimoto's thyroiditis   . Hypertension   . Allergy   . Chronic bronchitis   . OSA (obstructive sleep apnea)      Family History  Problem Relation Age of Onset  . Arthritis Mother   . Heart disease Mother   . Hyperlipidemia Mother   . Hypertension Mother   . Stroke Mother   . Cancer Mother     Breast & Uterine - 20'-30's  . Early death Father     Fire  . Alcohol abuse Father      History   Social History  . Marital Status: Single    Spouse Name: N/A    Number of Children: 4  . Years of Education: N/A   Occupational History  . EM [employer]    Social History Main Topics  . Smoking status: Former Smoker -- 0.5 packs/day for 18 years    Types: Cigarettes    Quit date: 01/25/1974  . Smokeless tobacco: Never Used  . Alcohol Use: Yes     Comment: occassionally beer or wine  . Drug Use: No  . Sexually Active: Not on file   Other Topics Concern  . Not  on file   Social History Narrative   Lives with friend. Has 4 children.     Allergies  Allergen Reactions  . Oxycodone Hcl Anaphylaxis    ALL NARCOTICS  . Sumycin (Tetracycline Hcl) Anaphylaxis  . Codeine   . Esomeprazole Magnesium   . Hydrochlorothiazide   . Indomethacin   . Mold Extract (Trichophyton Mentagrophyte)   . Neomycin-Bacitracin Zn-Polymyx   . Procaine Hcl     TACHYCARDIA   . Pseudoephedrine Hcl Er   . Telithromycin   . Tetracycline      Outpatient Prescriptions Prior to Visit  Medication Sig Dispense Refill  . albuterol (PROVENTIL HFA;VENTOLIN HFA) 108 (90 BASE) MCG/ACT inhaler Inhale 2 puffs into the lungs every 6 (six) hours as needed.  18 g  3    . ALPRAZolam (XANAX) 0.25 MG tablet Take 1 tablet (0.25 mg total) by mouth 3 (three) times daily as needed for sleep or anxiety.  90 tablet  0  . amLODipine (NORVASC) 5 MG tablet Take 5 mg by mouth daily.      Marland Kitchen atenolol (TENORMIN) 25 MG tablet Take 3 tablets (75 mg total) by mouth at bedtime.  270 tablet  1  . carboxymethylcellulose (REFRESH PLUS) 0.5 % SOLN 1 drop 3 (three) times daily as needed.      . cetirizine (ZYRTEC) 10 MG tablet Take 10 mg by mouth daily as needed.       . cholestyramine (QUESTRAN) 4 GM/DOSE powder Take 4 g by mouth daily.      . cloNIDine (CATAPRES) 0.1 MG tablet Take 1 tablet (0.1 mg total) by mouth 2 (two) times daily.  60 tablet  1  . lisinopril (PRINIVIL,ZESTRIL) 40 MG tablet Take 1 tablet (40 mg total) by mouth daily.  90 tablet  3  . losartan (COZAAR) 100 MG tablet Take 1 tablet (100 mg total) by mouth daily.  90 tablet  1  . Pancrelipase, Lip-Prot-Amyl, (CREON) 24000 UNITS CPEP Take 1 capsule by mouth daily.       Marland Kitchen PRESCRIPTION MEDICATION as needed. Compounded Nose liquid antibiotic      . Respiratory Therapy Supplies (ONE FLOW SPIROMETER) DEVI Please provide incentive spirometer DX: Asthma  1 each  0  . [DISCONTINUED] beclomethasone (QVAR) 80 MCG/ACT inhaler Inhale 1 puff into the lungs 2 (two) times daily.      . [DISCONTINUED] budesonide-formoterol (SYMBICORT) 160-4.5 MCG/ACT inhaler Inhale 2 puffs into the lungs 2 (two) times daily.  1 Inhaler  3   Last reviewed on 02/15/2012  2:10 PM by Christen Butter, CMA    Review of Systems  Constitutional: Negative for fever, chills and unexpected weight change.  HENT: Positive for congestion and sneezing. Negative for ear pain, nosebleeds, sore throat, rhinorrhea, trouble swallowing, dental problem, voice change, postnasal drip and sinus pressure.   Eyes: Negative for visual disturbance.  Respiratory: Negative for cough, choking and shortness of breath.   Cardiovascular: Negative for chest pain and leg swelling.   Gastrointestinal: Positive for abdominal pain. Negative for vomiting and diarrhea.  Genitourinary: Negative for difficulty urinating.  Musculoskeletal: Positive for arthralgias.  Skin: Negative for rash.  Neurological: Negative for tremors, syncope and headaches.  Hematological: Does not bruise/bleed easily.       Objective:   Physical Exam  Filed Vitals:   02/15/12 1415  BP: 132/70  Pulse: 50  Temp: 98 F (36.7 C)  TempSrc: Oral  Height: 5\' 3"  (1.6 m)  Weight: 144 lb 1.9 oz (65.372 kg)  SpO2:  97%    Gen: well appearing, no acute distress HEENT: NCAT, PERRL, EOMi, OP clear, neck supple without masses PULM: CTA B CV: RRR, no mgr, no JVD AB: BS+, soft, nontender, no hsm Ext: warm, no edema, no clubbing, no cyanosis Derm: no rash or skin breakdown Neuro: A&Ox4, CN II-XII intact, strength 5/5 in all 4 extremities  02/15/2012 simple spirometry normal April 2013 chest x-ray normal     Assessment & Plan:   Asthma Ms. Balash is here to see [place] today primarily cancer the question as to whether or not she has asthma contributing to her untreated sleep apnea. She had no childhood symptoms of asthma and did not develop significant bronchitis until she had recurrent exposure to mold in 2012 through 2013. Since moving to a newly built townhouse in 2013 she has no longer experienced any symptoms of bronchitis, cough, chest tightness, or shortness of breath or wheezing.  Apparently she was told by an allergist in [facility] (not [facility]) that she had asthma. She states that she had a spirometry test that was abnormal and this led to this diagnosis.  Today her simple spirometry is completely normal and not suggestive of airway obstruction.  The symptoms that she experienced in 4098 could certainly be attributed to severe allergic rhinitis do to her known mold allergy. Based on the fact that she does not have any symptoms of asthma I seriously question  this diagnosis. In order to put it to rest, she could have a methacholine challenge test. However she states that since she has been doing well she does not want to undergo this test.  Plan: -If she develops chest pain, chest tightness, shortness of breath, or cough then she should undergo a methacholine challenge testing to evaluate for asthma. -In the meantime I suggest no change to her medications -We will refer her to a sleep specialist as her primary concern is obstructive sleep apnea which has been unsuccessfully treated despite an extensive assessment and a variety of treatment modalities over the last 10-15 years.   Sleep apnea A polysomnogram performed at [facility] in 2013 showed an elevated AHI in the supine position but overall was not consistent with a diagnosis of obstructive sleep apnea. There was a suggestion of upper airway resistance syndrome based on frequent arousals with RERA's.  This is a bit confusing because in the past she has had multiple (6) sleep studies and has been told by sleep specialist that she does in fact have sleep apnea. Based on this I think she needs to see a sleep specialist to help put this issue to rest.  I recommended that she go back to either Dr. Maple Hudson in [place] or to her sleep specialist at [facility], however she would prefer to see someone new. Based on this I have referred her to [facility] Vista Lawman at [facility].    Updated Medication List Outpatient Encounter Prescriptions as of 02/15/2012  Medication Sig Dispense Refill  . albuterol (PROVENTIL HFA;VENTOLIN HFA) 108 (90 BASE) MCG/ACT inhaler Inhale 2 puffs into the lungs every 6 (six) hours as needed.  18 g  3  . ALPRAZolam (XANAX) 0.25 MG tablet Take 1 tablet (0.25 mg total) by mouth 3 (three) times daily as needed for sleep or anxiety.  90 tablet  0  . amLODipine (NORVASC) 5 MG tablet Take 5 mg by mouth daily.      Marland Kitchen atenolol (TENORMIN) 25 MG tablet Take 3 tablets (75  mg  total) by mouth at bedtime.  270 tablet  1  . carboxymethylcellulose (REFRESH PLUS) 0.5 % SOLN 1 drop 3 (three) times daily as needed.      . cetirizine (ZYRTEC) 10 MG tablet Take 10 mg by mouth daily as needed.       . cholestyramine (QUESTRAN) 4 GM/DOSE powder Take 4 g by mouth daily.      . cloNIDine (CATAPRES) 0.1 MG tablet Take 1 tablet (0.1 mg total) by mouth 2 (two) times daily.  60 tablet  1  . lisinopril (PRINIVIL,ZESTRIL) 40 MG tablet Take 1 tablet (40 mg total) by mouth daily.  90 tablet  3  . losartan (COZAAR) 100 MG tablet Take 1 tablet (100 mg total) by mouth daily.  90 tablet  1  . Pancrelipase, Lip-Prot-Amyl, (CREON) 24000 UNITS CPEP Take 1 capsule by mouth daily.       Marland Kitchen PRESCRIPTION MEDICATION as needed. Compounded Nose liquid antibiotic      . Respiratory Therapy Supplies (ONE FLOW SPIROMETER) DEVI Please provide incentive spirometer DX: Asthma  1 each  0  . [DISCONTINUED] beclomethasone (QVAR) 80 MCG/ACT inhaler Inhale 1 puff into the lungs 2 (two) times daily.      . [DISCONTINUED] budesonide-formoterol (SYMBICORT) 160-4.5 MCG/ACT inhaler Inhale 2 puffs into the lungs 2 (two) times daily.  1 Inhaler  3

## 2012-02-15 NOTE — Assessment & Plan Note (Signed)
A polysomnogram performed at [facility] in 2013 showed an elevated AHI in the supine position but overall was not consistent with a diagnosis of obstructive sleep apnea. There was a suggestion of upper airway resistance syndrome based on frequent arousals with RERA's.  This is a bit confusing because in the past she has had multiple (6) sleep studies and has been told by sleep specialist that she does in fact have sleep apnea. Based on this I think she needs to see a sleep specialist to help put this issue to rest.  I recommended that she go back to either Dr. Maple Hudson in [place] or to her sleep specialist at [facility], however she would prefer to see someone new. Based on this I have referred her to Dr. Vista Lawman at [facility].

## 2012-02-15 NOTE — Patient Instructions (Signed)
If you decide that she wants to have a methacholine challenge test please call our office to let us know If you develop chest tightness, shortness of breath, wheezing, or cough please call us to let us know  We will refer you to Dr. Vista Lawman at [facility] for evaluation of your sleep apnea.

## 2012-02-17 ENCOUNTER — Telehealth: Payer: Self-pay | Admitting: Internal Medicine

## 2012-02-17 NOTE — Telephone Encounter (Signed)
Testing for H. Pylori was negative.

## 2012-02-18 NOTE — Telephone Encounter (Signed)
Left message on voicemail.

## 2012-02-22 ENCOUNTER — Telehealth: Payer: Self-pay | Admitting: Internal Medicine

## 2012-02-22 NOTE — Telephone Encounter (Signed)
Pt returned Regina's call.  Please call pt again [phone].

## 2012-02-22 NOTE — Telephone Encounter (Signed)
Left message on machine to call back  

## 2012-02-23 ENCOUNTER — Other Ambulatory Visit: Payer: Self-pay | Admitting: Internal Medicine

## 2012-02-23 NOTE — Telephone Encounter (Signed)
Patient notified as instructed by telephone. 

## 2012-02-23 NOTE — Telephone Encounter (Signed)
Patient notified as test results.

## 2012-02-28 ENCOUNTER — Encounter: Payer: Self-pay | Admitting: Internal Medicine

## 2012-03-17 ENCOUNTER — Encounter: Payer: Medicare Other | Admitting: Adult Health

## 2012-03-19 ENCOUNTER — Encounter: Payer: Self-pay | Admitting: Adult Health

## 2012-03-19 NOTE — Progress Notes (Deleted)
  Subjective:    Patient ID: Yvonne Davidson, female    DOB: Jul 24, 1940, 72 y.o.   MRN: [id number]  HPI     Review of Systems     Objective:   Physical Exam        Assessment & Plan:

## 2012-03-21 ENCOUNTER — Ambulatory Visit (INDEPENDENT_AMBULATORY_CARE_PROVIDER_SITE_OTHER): Payer: Medicare Other | Admitting: Adult Health

## 2012-03-23 ENCOUNTER — Telehealth: Payer: Self-pay | Admitting: Adult Health

## 2012-03-23 ENCOUNTER — Encounter: Payer: Self-pay | Admitting: Adult Health

## 2012-03-23 ENCOUNTER — Ambulatory Visit (INDEPENDENT_AMBULATORY_CARE_PROVIDER_SITE_OTHER): Payer: Medicare Other | Admitting: Adult Health

## 2012-03-23 ENCOUNTER — Other Ambulatory Visit: Payer: Self-pay | Admitting: Internal Medicine

## 2012-03-23 VITALS — BP 100/58 | HR 53 | Temp 97.8°F | Resp 16 | Ht 63.0 in | Wt 142.0 lb

## 2012-03-23 MED ORDER — SUCRALFATE 1 GM/10ML PO SUSP: 1.0000 g | Freq: Four times a day (QID) | ORAL | Status: DC

## 2012-03-23 NOTE — Telephone Encounter (Signed)
Pt calling back asking that we send in a pill form of what was sent in earlier.

## 2012-03-23 NOTE — Progress Notes (Signed)
Subjective:    Patient ID: Candace Gallus, female    DOB: [birth year], 72 y.o.   MRN: [id number]  HPI  Patient is a pleasant 72 year old female who presents to clinic this morning with complaints of gastric burning. She reports "I have gastritis". She further reports that she has had these symptoms in the past and they are exactly the same. Patient is status post upper endoscopy and colonoscopy in 2008. She has a history  H. pylori not treated secondary to patient being highly sensitive to multiple medications. She reports that her gastroenterologist, at that time, felt it prudent not to treat her H. Pylori. She denies fevers, chills, nausea, vomiting. She also reports that in the past Carafate was prescribed and helped her best. Patient has tried omeprazole 20 mg tablets one daily for 14 days with some improvement but not complete resolution.   Current Outpatient Prescriptions on File Prior to Visit  Medication Sig Dispense Refill  . amLODipine (NORVASC) 5 MG tablet Take 5 mg by mouth daily.      Marland Kitchen atenolol (TENORMIN) 25 MG tablet Take 3 tablets (75 mg total) by mouth at bedtime.  270 tablet  1  . carboxymethylcellulose (REFRESH PLUS) 0.5 % SOLN 1 drop 3 (three) times daily as needed.      . cetirizine (ZYRTEC) 10 MG tablet Take 10 mg by mouth daily as needed.       . cloNIDine (CATAPRES) 0.1 MG tablet TAKE ONE TABLET TWICE A DAY  60 tablet  3  . lisinopril (PRINIVIL,ZESTRIL) 40 MG tablet Take 1 tablet (40 mg total) by mouth daily.  90 tablet  3  . losartan (COZAAR) 100 MG tablet Take 1 tablet (100 mg total) by mouth daily.  90 tablet  1  . Pancrelipase, Lip-Prot-Amyl, (CREON) 24000 UNITS CPEP Take 1 capsule by mouth daily.       Marland Kitchen PRESCRIPTION MEDICATION 2 (two) times daily. Compounded Nose liquid antibiotic      . Respiratory Therapy Supplies (ONE FLOW SPIROMETER) DEVI Please provide incentive spirometer DX: Asthma  1 each  0  . albuterol (PROVENTIL HFA;VENTOLIN HFA) 108 (90 BASE) MCG/ACT  inhaler Inhale 2 puffs into the lungs every 6 (six) hours as needed.  18 g  3  . ALPRAZolam (XANAX) 0.25 MG tablet Take 1 tablet (0.25 mg total) by mouth 3 (three) times daily as needed for sleep or anxiety.  90 tablet  0  . cholestyramine (QUESTRAN) 4 GM/DOSE powder Take 4 g by mouth daily.       No current facility-administered medications on file prior to visit.      Review of Systems  Constitutional: Negative for fever and chills.  Gastrointestinal: Negative for nausea, vomiting, abdominal pain and diarrhea.       Burning sensation in stomach  Psychiatric/Behavioral: Negative.     BP 100/58  Pulse 53  Temp(Src) 97.8 F (36.6 C) (Oral)  Resp 16  Ht 5\' 3"  (1.6 m)  Wt 142 lb (64.411 kg)  BMI 25.16 kg/m2  SpO2 99%     Objective:   Physical Exam  Constitutional: She is oriented to person, place, and time. She appears well-developed and well-nourished. No distress.  Abdominal: Soft. Bowel sounds are normal. She exhibits no distension and no mass. There is no tenderness. There is no rebound and no guarding.  Neurological: She is alert and oriented to person, place, and time.  Skin: Skin is warm and dry.  Psychiatric: She has a normal mood and affect. Her  behavior is normal. Judgment and thought content normal.  Pleasant and cooperative.          Assessment & Plan:

## 2012-03-23 NOTE — Assessment & Plan Note (Signed)
Will start Carafate since this has helped patient in the past. If no resolution of symptoms within 2 weeks, will refer to her GI physician.

## 2012-03-23 NOTE — Patient Instructions (Addendum)
  I have sent a prescription for Carafate to [facility].  Take one hour before meals or 2 hours after meals.  Please call us if your symptoms are not improved within 2 weeks.

## 2012-03-23 NOTE — Telephone Encounter (Signed)
Pt states she was prescribed carafate at her appointment this morning with Raquel but when she got to the pharmacy it was $95 for the copay.  Pt states she cannot afford this and was told by the pharmacy that there is a generic that she can crush up and add to applesauce since she has trouble swallowing large pills.  Pt is asking that a new prescription for the generic be called in asap (today) so that she can pick it up as she is having severe problems with her gastritis and needs some help.  Please advise.

## 2012-03-24 NOTE — Telephone Encounter (Signed)
Spoke with pharmacist at [facility]. Already taken care of.

## 2012-03-24 NOTE — Telephone Encounter (Signed)
Patient is needing a prior authorization done for her carafate and she stated the insurance company has faxed over a form to be filled out and she is needing something done as soon as possible.

## 2012-03-24 NOTE — Telephone Encounter (Signed)
Patient is requesting a pill form and something less expensive of the prescribed carafate

## 2012-03-27 NOTE — Progress Notes (Signed)
This encounter was created in error - please disregard.

## 2012-04-12 ENCOUNTER — Ambulatory Visit: Payer: Self-pay | Admitting: Internal Medicine

## 2012-04-13 ENCOUNTER — Telehealth: Payer: Self-pay | Admitting: Internal Medicine

## 2012-04-13 ENCOUNTER — Ambulatory Visit: Payer: Self-pay | Admitting: Unknown Physician Specialty

## 2012-04-13 NOTE — Telephone Encounter (Signed)
Patient informed and verbally agreed. She will discuss this when she comes in for her appt next month.

## 2012-04-13 NOTE — Telephone Encounter (Signed)
Bone density testing from 04/12/2012 showed T score of -2.3 considered osteopenia. Please schedule patient followup visit to discuss.

## 2012-05-03 ENCOUNTER — Encounter: Payer: Self-pay | Admitting: Internal Medicine

## 2012-05-15 ENCOUNTER — Ambulatory Visit: Payer: Self-pay | Admitting: Unknown Physician Specialty

## 2012-05-16 ENCOUNTER — Other Ambulatory Visit: Payer: Self-pay | Admitting: Internal Medicine

## 2012-05-16 NOTE — Telephone Encounter (Signed)
Rx sent to pharmacy by escript  

## 2012-05-17 ENCOUNTER — Encounter: Payer: Self-pay | Admitting: Internal Medicine

## 2012-05-17 ENCOUNTER — Ambulatory Visit (INDEPENDENT_AMBULATORY_CARE_PROVIDER_SITE_OTHER): Payer: Medicare Other | Admitting: Internal Medicine

## 2012-05-17 VITALS — BP 154/74 | HR 52 | Temp 98.0°F | Ht 62.5 in | Wt 141.0 lb

## 2012-05-17 DIAGNOSIS — K8020 Calculus of gallbladder without cholecystitis without obstruction: Secondary | ICD-10-CM | POA: Diagnosis not present

## 2012-05-17 DIAGNOSIS — K219 Gastro-esophageal reflux disease without esophagitis: Secondary | ICD-10-CM

## 2012-05-17 DIAGNOSIS — K2970 Gastritis, unspecified, without bleeding: Secondary | ICD-10-CM

## 2012-05-17 DIAGNOSIS — R739 Hyperglycemia, unspecified: Secondary | ICD-10-CM

## 2012-05-17 DIAGNOSIS — Z0000 Encounter for general adult medical examination without abnormal findings: Secondary | ICD-10-CM | POA: Insufficient documentation

## 2012-05-17 DIAGNOSIS — R7309 Other abnormal glucose: Secondary | ICD-10-CM

## 2012-05-17 DIAGNOSIS — I10 Essential (primary) hypertension: Secondary | ICD-10-CM

## 2012-05-17 DIAGNOSIS — F419 Anxiety disorder, unspecified: Secondary | ICD-10-CM

## 2012-05-17 DIAGNOSIS — K2990 Gastroduodenitis, unspecified, without bleeding: Secondary | ICD-10-CM

## 2012-05-17 DIAGNOSIS — G4730 Sleep apnea, unspecified: Secondary | ICD-10-CM

## 2012-05-17 DIAGNOSIS — F411 Generalized anxiety disorder: Secondary | ICD-10-CM

## 2012-05-17 HISTORY — DX: Gastritis, unspecified, without bleeding: K29.70

## 2012-05-17 HISTORY — DX: Gastroduodenitis, unspecified, without bleeding: K29.90

## 2012-05-17 MED ORDER — PANCRELIPASE (LIP-PROT-AMYL) 24000-76000 UNITS PO CPEP: 1.0000 | ORAL_CAPSULE | Freq: Every day | ORAL | Status: DC

## 2012-05-17 MED ORDER — PANTOPRAZOLE SODIUM 40 MG PO TBEC: 40.0000 mg | DELAYED_RELEASE_TABLET | Freq: Two times a day (BID) | ORAL | Status: DC

## 2012-05-17 NOTE — Assessment & Plan Note (Signed)
Discussed risk of using both losartan and lisinopril together. Will stop lisinopril and monitor BP at night. Will try to simplify anti-hypertensive regimen. Follow up 4 weeks.

## 2012-05-17 NOTE — Patient Instructions (Signed)
We will set up referral to Dr. Katrinka Blazing to address you gallbladder.  Stop Lisinopril and monitor blood pressure at home.  Stop omeprazole and start pantoprazole 40mg  twice daily.  Follow up 4 weeks for Wellness Visit.

## 2012-05-17 NOTE — Assessment & Plan Note (Addendum)
Pt with pressured, rapid, tangential speech. Denies significant anxiety.  Does not wish to start additional medication to help with anxiety. Will monitor for now. Would favor psychiatric evaluation in the future if symptoms are persistent.  Over of which >50% spent in face-to-face contact with patient discussing plan of care of multiple ongoing medical concerns.

## 2012-05-17 NOTE — Assessment & Plan Note (Signed)
As above, no improvement with PPI and sucralfate. Will request notes on recent EGD and GI evaluation.

## 2012-05-17 NOTE — Assessment & Plan Note (Signed)
Referral to sleep specialist in place. Pt unable to tolerate CPAP due to latex allergy. Planning to try alternative mouthpiece.

## 2012-05-17 NOTE — Progress Notes (Signed)
Subjective:    Patient ID: Yvonne Davidson, female    DOB: 28-May-1940, 72 y.o.   MRN: [id number]  HPI 72YO female with anxiety, HTN, GERD, sleep apnea presents for follow up.  GERD - symptoms recently worsening. Taking both omeprazole and sucrafate, with no improvement in burning epigastric abdominal pain. Recently seen by Dr. Markham Jordan with GI, had EGD, results pending. Noted to have gallstones on abdominal US evaluation. Has not yet made follow up with general surgeon to discuss cholecystectomy. Has some intermittent RUQ pain and nausea.   HTN - BP markedly elevated per pt, at night. She attributes this to untreated sleep apnea. Reports that elevated BP wakes her from sleep. Reviewed cardiology notes which comment on "complicated regimen" but no recent changes made.  Sleep apnea - in process of referral to another specialist at [facility] to help pt find mask she can wear for CPAP. ECHO has also been ordered.  Anxiety - pt denies recent anxiety. Using alprazolam prn.  Outpatient Encounter Prescriptions as of 05/17/2012  Medication Sig Dispense Refill  . albuterol (PROVENTIL HFA;VENTOLIN HFA) 108 (90 BASE) MCG/ACT inhaler Inhale 2 puffs into the lungs every 6 (six) hours as needed.  18 g  3  . amLODipine (NORVASC) 5 MG tablet Take 5 mg by mouth daily.      Marland Kitchen atenolol (TENORMIN) 25 MG tablet TAKE THREE TABLETS AT BEDTIME  270 tablet  5  . carboxymethylcellulose (REFRESH PLUS) 0.5 % SOLN 1 drop 3 (three) times daily as needed.      . cetirizine (ZYRTEC) 10 MG tablet Take 10 mg by mouth daily as needed.       . cholestyramine (QUESTRAN) 4 GM/DOSE powder Take 4 g by mouth daily.      Marland Kitchen losartan (COZAAR) 100 MG tablet Take 1 tablet (100 mg total) by mouth daily.  90 tablet  1  . nystatin (MYCOSTATIN) 100000 UNIT/ML suspension       . Pancrelipase, Lip-Prot-Amyl, (CREON) 24000 UNITS CPEP Take 1 capsule (24,000 Units total) by mouth daily.  180 capsule  3  . PRESCRIPTION MEDICATION 2 (two)  times daily. Compounded Nose liquid antibiotic      . sucralfate (CARAFATE) 1 GM/10ML suspension Take 10 mLs (1 g total) by mouth 4 (four) times daily.  420 mL  0  . ALPRAZolam (XANAX) 0.25 MG tablet Take 1 tablet (0.25 mg total) by mouth 3 (three) times daily as needed for sleep or anxiety.  90 tablet  0  . cloNIDine (CATAPRES) 0.1 MG tablet TAKE ONE TABLET TWICE A DAY  60 tablet  3  . pantoprazole (PROTONIX) 40 MG tablet Take 1 tablet (40 mg total) by mouth 2 (two) times daily.  60 tablet  3  . Respiratory Therapy Supplies (ONE FLOW SPIROMETER) DEVI Please provide incentive spirometer DX: Asthma  1 each  0   No facility-administered encounter medications on file as of 05/17/2012.   BP 154/74  Pulse 52  Temp(Src) 98 F (36.7 C) (Oral)  Ht 5' 2.5" (1.588 m)  Wt 141 lb (63.957 kg)  BMI 25.36 kg/m2  SpO2 97%  Review of Systems  Constitutional: Positive for fatigue. Negative for fever, chills, appetite change and unexpected weight change.  HENT: Negative for ear pain, congestion, sore throat, trouble swallowing, neck pain, voice change and sinus pressure.   Eyes: Negative for visual disturbance.  Respiratory: Negative for cough, shortness of breath, wheezing and stridor.   Cardiovascular: Negative for chest pain, palpitations and leg swelling.  Gastrointestinal: Positive for abdominal pain. Negative for nausea, vomiting, diarrhea, constipation, blood in stool, abdominal distention and anal bleeding.  Genitourinary: Negative for dysuria and flank pain.  Musculoskeletal: Negative for myalgias, arthralgias and gait problem.  Skin: Negative for color change and rash.  Neurological: Negative for dizziness and headaches.  Hematological: Negative for adenopathy. Does not bruise/bleed easily.  Psychiatric/Behavioral: Positive for sleep disturbance. Negative for suicidal ideas and dysphoric mood. The patient is nervous/anxious.        Objective:   Physical Exam  Constitutional: She is oriented  to person, place, and time. She appears well-developed and well-nourished. No distress.  HENT:  Head: Normocephalic and atraumatic.  Right Ear: External ear normal.  Left Ear: External ear normal.  Nose: Nose normal.  Mouth/Throat: Oropharynx is clear and moist. No oropharyngeal exudate.  Eyes: Conjunctivae are normal. Pupils are equal, round, and reactive to light. Right eye exhibits no discharge. Left eye exhibits no discharge. No scleral icterus.  Neck: Normal range of motion. Neck supple. No tracheal deviation present. No thyromegaly present.  Cardiovascular: Normal rate, regular rhythm, normal heart sounds and intact distal pulses.  Exam reveals no gallop and no friction rub.   No murmur heard. Pulmonary/Chest: Effort normal and breath sounds normal. No accessory muscle usage. Not tachypneic. No respiratory distress. She has no decreased breath sounds. She has no wheezes. She has no rhonchi. She has no rales. She exhibits no tenderness.  Abdominal: Soft. Bowel sounds are normal. She exhibits no distension and no mass. There is no tenderness. There is no rebound and no guarding.  Musculoskeletal: Normal range of motion. She exhibits no edema and no tenderness.  Lymphadenopathy:    She has no cervical adenopathy.  Neurological: She is alert and oriented to person, place, and time. No cranial nerve deficit. She exhibits normal muscle tone. Coordination normal.  Skin: Skin is warm and dry. No rash noted. She is not diaphoretic. No erythema. No pallor.  Psychiatric: Judgment and thought content normal. Her mood appears anxious. Her speech is rapid and/or pressured and tangential. She is hyperactive. Cognition and memory are normal.          Assessment & Plan:

## 2012-05-17 NOTE — Assessment & Plan Note (Signed)
Will request notes on recent US abdomen, which, per pt showed cholelithiasis. Will set up general surgery evaluation.

## 2012-05-17 NOTE — Assessment & Plan Note (Signed)
Symptoms poorly controlled with omeprazole and sucrafate. Will try to get results of recent EGD. Will try changing to pantoprazole to see if any improvement with alternative PPI.

## 2012-05-18 LAB — COMPREHENSIVE METABOLIC PANEL
ALT: 16 U/L (ref 0–35)
AST: 19 U/L (ref 0–37)
Albumin: 4 g/dL (ref 3.5–5.2)
Alkaline Phosphatase: 51 U/L (ref 39–117)
BUN: 17 mg/dL (ref 6–23)
Potassium: 4 mEq/L (ref 3.5–5.1)
Sodium: 140 mEq/L (ref 135–145)
Total Protein: 6.6 g/dL (ref 6.0–8.3)

## 2012-05-18 LAB — LIPID PANEL: Triglycerides: 267 mg/dL — ABNORMAL HIGH (ref 0.0–149.0)

## 2012-05-18 LAB — LDL CHOLESTEROL, DIRECT: Direct LDL: 156.6 mg/dL

## 2012-05-18 LAB — CBC WITH DIFFERENTIAL/PLATELET
Basophils Relative: 0.9 % (ref 0.0–3.0)
Eosinophils Absolute: 0.2 10*3/uL (ref 0.0–0.7)
MCHC: 34.4 g/dL (ref 30.0–36.0)
MCV: 89.9 fl (ref 78.0–100.0)
Monocytes Absolute: 0.6 10*3/uL (ref 0.1–1.0)
Neutrophils Relative %: 59.6 % (ref 43.0–77.0)
RBC: 4.05 Mil/uL (ref 3.87–5.11)

## 2012-05-19 LAB — HEMOGLOBIN A1C: Hgb A1c MFr Bld: 6.5 % (ref 4.6–6.5)

## 2012-05-22 ENCOUNTER — Encounter: Payer: Self-pay | Admitting: *Deleted

## 2012-05-24 ENCOUNTER — Other Ambulatory Visit: Payer: Self-pay | Admitting: Internal Medicine

## 2012-05-30 ENCOUNTER — Ambulatory Visit: Payer: Self-pay | Admitting: Surgery

## 2012-05-30 DIAGNOSIS — I10 Essential (primary) hypertension: Secondary | ICD-10-CM

## 2012-06-02 ENCOUNTER — Telehealth: Payer: Self-pay | Admitting: Internal Medicine

## 2012-06-02 NOTE — Telephone Encounter (Signed)
Patient states she had a echo done at [facility]- did you receive these results? Please advise Also, she is concerned that she is having gallbladder surgery and that she is scared of not waking up due to her sleep apnea. Please advise.

## 2012-06-02 NOTE — Telephone Encounter (Signed)
For the anesthesia, she will have an evaluation with an anesthesiologist ahead of time and they will address these concerns. I received some information from [facility], however it has not been scanned in our system yet, so I cannot access it.

## 2012-06-02 NOTE — Telephone Encounter (Signed)
Patient informed to address her issues with her surgeon office. She agreed she will do this.

## 2012-06-05 ENCOUNTER — Encounter: Payer: Self-pay | Admitting: Internal Medicine

## 2012-06-12 ENCOUNTER — Telehealth: Payer: Self-pay | Admitting: Internal Medicine

## 2012-06-12 NOTE — Telephone Encounter (Signed)
Pt called to get a refill on her creon [id number] [facility]  [phone]  Pt wanted to know if there was something else she could take that is less expensive

## 2012-06-12 NOTE — Telephone Encounter (Signed)
We can refill this medication as she has been on it for prolonged time. There is not a less expensive alternative.

## 2012-06-12 NOTE — Telephone Encounter (Signed)
Please advise 

## 2012-06-13 MED ORDER — PANCRELIPASE (LIP-PROT-AMYL) 24000-76000 UNITS PO CPEP: 1.0000 | ORAL_CAPSULE | Freq: Every day | ORAL | Status: DC

## 2012-06-13 NOTE — Telephone Encounter (Signed)
She should follow up with GI physician. They may want to try an alternative approach such as medication.

## 2012-06-13 NOTE — Telephone Encounter (Signed)
Prescription sent to pharmacy and patient would like for you to know she still has not had her gallbladder surgery and not sure if she will. She is a little upset because she can not be put to sleep with the anesthesia. She swallows air and cannot use the regular CPAP machine. But she is having a lot of trouble with her gallbladder, it is beginning to bother her even more. Having sharp pains in her abd and it is shooting up in her back. Any suggestions of what to do.

## 2012-06-14 NOTE — Telephone Encounter (Signed)
Patient informed and verbally agreed understanding.  

## 2012-06-14 NOTE — Telephone Encounter (Signed)
Left message to call back  

## 2012-06-22 LAB — HM MAMMOGRAPHY: HM Mammogram: NORMAL

## 2012-07-10 ENCOUNTER — Encounter: Payer: Self-pay | Admitting: Internal Medicine

## 2012-07-10 ENCOUNTER — Ambulatory Visit (INDEPENDENT_AMBULATORY_CARE_PROVIDER_SITE_OTHER): Payer: Medicare Other | Admitting: Internal Medicine

## 2012-07-10 VITALS — BP 122/80 | HR 52 | Temp 98.1°F | Ht 62.75 in | Wt 139.0 lb

## 2012-07-10 DIAGNOSIS — I10 Essential (primary) hypertension: Secondary | ICD-10-CM

## 2012-07-10 DIAGNOSIS — Z0000 Encounter for general adult medical examination without abnormal findings: Secondary | ICD-10-CM

## 2012-07-10 DIAGNOSIS — M810 Age-related osteoporosis without current pathological fracture: Secondary | ICD-10-CM

## 2012-07-10 NOTE — Progress Notes (Signed)
Subjective:    Patient ID: Yvonne Davidson, female    DOB: January 09, 1941, 72 y.o.   MRN: [id number]  HPI The patient is here for annual Medicare wellness examination and management of other chronic and acute problems.   The risk factors are reflected in the social history.  The roster of all physicians providing medical care to patient - is listed in the Snapshot section of the chart.  Activities of daily living:  The patient is 100% independent in all ADLs: dressing, toileting, feeding as well as independent mobility  Home safety : The patient has smoke detectors in the home. They wear seatbelts.  There are no firearms at home. There is no violence in the home.   There is no risks for hepatitis, STDs or HIV. There is no history of blood transfusion. They have no travel history to infectious disease endemic areas of the world.  The patient has seen their dentist in the last six month. Dr. Robert Bellow They have seen their eye doctor in the last year. Dr. Fransico Michael No issues with hearing. Some issues in the past, seen by Dr. Elenore Rota and told not a candidate for hearing aids. They have deferred audiologic testing in the last year.   They do not  have excessive sun exposure. Discussed the need for sun protection: hats, long sleeves and use of sunscreen if there is significant sun exposure.  Dr. Monia Pouch - cardiologist Dr. Kendrick Fries - Pulmonologist  Diet: the importance of a healthy diet is discussed. They do have a healthy diet.  The benefits of regular aerobic exercise were discussed. She walks stairs.  Depression screen: there are no signs or vegative symptoms of depression- irritability, change in appetite, anhedonia, sadness/tearfullness.  Cognitive assessment: the patient manages all their financial and personal affairs and is actively engaged. They could relate day,date,year and events.  The following portions of the patient's history were reviewed and updated as appropriate: allergies, current  medications, past family history, past medical history,  past surgical history, past social history  and problem list.  Visual acuity was not assessed per patient preference since she has regular follow up with her ophthalmologist. Hearing and body mass index were assessed and reviewed.   During the course of the visit the patient was educated and counseled about appropriate screening and preventive services including : fall prevention , diabetes screening, nutrition counseling, colorectal cancer screening, and recommended immunizations.      Review of Systems  Constitutional: Negative for fever, chills, appetite change, fatigue and unexpected weight change.  HENT: Negative for ear pain, congestion, sore throat, trouble swallowing, neck pain, voice change and sinus pressure.   Eyes: Negative for visual disturbance.  Respiratory: Negative for cough, shortness of breath, wheezing and stridor.   Cardiovascular: Negative for chest pain, palpitations and leg swelling.  Gastrointestinal: Negative for nausea, vomiting, abdominal pain, diarrhea, constipation, blood in stool, abdominal distention and anal bleeding.  Genitourinary: Negative for dysuria and flank pain.  Musculoskeletal: Negative for myalgias, arthralgias and gait problem.  Skin: Negative for color change and rash.  Neurological: Negative for dizziness and headaches.  Hematological: Negative for adenopathy. Does not bruise/bleed easily.  Psychiatric/Behavioral: Negative for suicidal ideas, sleep disturbance and dysphoric mood. The patient is not nervous/anxious.        Objective:   Physical Exam  Constitutional: She is oriented to person, place, and time. She appears well-developed and well-nourished. No distress.  HENT:  Head: Normocephalic and atraumatic.  Right Ear: External ear normal.  Left  Ear: External ear normal.  Nose: Nose normal.  Mouth/Throat: Oropharynx is clear and moist. No oropharyngeal exudate.  Eyes:  Conjunctivae are normal. Pupils are equal, round, and reactive to light. Right eye exhibits no discharge. Left eye exhibits no discharge. No scleral icterus.  Neck: Normal range of motion. Neck supple. No tracheal deviation present. No thyromegaly present.  Cardiovascular: Normal rate, regular rhythm, normal heart sounds and intact distal pulses.  Exam reveals no gallop and no friction rub.   No murmur heard. Pulmonary/Chest: Effort normal and breath sounds normal. No accessory muscle usage. Not tachypneic. No respiratory distress. She has no decreased breath sounds. She has no wheezes. She has no rhonchi. She has no rales. She exhibits no tenderness. Right breast exhibits no inverted nipple, no mass, no nipple discharge, no skin change and no tenderness. Left breast exhibits no inverted nipple, no mass, no nipple discharge, no skin change and no tenderness. Breasts are symmetrical.  Abdominal: Soft. Bowel sounds are normal. She exhibits no distension and no mass. There is no tenderness. There is no rebound and no guarding.  Musculoskeletal: Normal range of motion. She exhibits no edema and no tenderness.  Lymphadenopathy:    She has no cervical adenopathy.  Neurological: She is alert and oriented to person, place, and time. No cranial nerve deficit. She exhibits normal muscle tone. Coordination normal.  Skin: Skin is warm and dry. No rash noted. She is not diaphoretic. No erythema. No pallor.  Psychiatric: She has a normal mood and affect. Her behavior is normal. Judgment and thought content normal.          Assessment & Plan:

## 2012-07-10 NOTE — Assessment & Plan Note (Addendum)
General medical exam including breast exam normal today. PAP and pelvic deferred per pt preference and given h/o all normal PAPs. Appropriate screening performed. Health maintenance is UTD. Recent labs including lipids normal. Discussed upcoming evaluation for pulmonary hypertension, scheduled at [facility], and upcoming visit with GI physician regarding chronic gastritis. Encouraged healthy diet and regular physical activity. Will have pt follow up here in 6 months and prn.

## 2012-07-10 NOTE — Assessment & Plan Note (Signed)
Reviewed recent bone density testing which showed T-score -2.3. Discussed options for treatment including bisphosphonates. Pt is not a good candidate for bisphosphonates given ongoing issues with gastritis. Discussed Prolia, however given ongoing evaluation for pulmonary hypertension, pt would like to wait. Encouraged weight bearing activity such as walking. Encouraged adequate dietary intake of Calcium. Will plan to check Vit D with next labs.

## 2012-07-17 ENCOUNTER — Encounter: Payer: Self-pay | Admitting: Emergency Medicine

## 2012-07-23 DIAGNOSIS — I272 Pulmonary hypertension, unspecified: Secondary | ICD-10-CM | POA: Insufficient documentation

## 2012-08-01 ENCOUNTER — Ambulatory Visit: Payer: Self-pay | Admitting: Internal Medicine

## 2012-08-11 ENCOUNTER — Encounter: Payer: Self-pay | Admitting: Internal Medicine

## 2012-08-22 ENCOUNTER — Other Ambulatory Visit: Payer: Self-pay

## 2012-08-22 LAB — TSH: Thyroid Stimulating Horm: 3.07 u[IU]/mL

## 2012-09-04 ENCOUNTER — Telehealth: Payer: Self-pay | Admitting: Internal Medicine

## 2012-09-04 DIAGNOSIS — K8020 Calculus of gallbladder without cholecystitis without obstruction: Secondary | ICD-10-CM

## 2012-09-04 NOTE — Telephone Encounter (Signed)
Left message to call back  

## 2012-09-04 NOTE — Telephone Encounter (Signed)
Pt is going General Surgery at [facility] and they are wanting [place] to put in a referral for that. Please contact the patient with any questions.

## 2012-09-05 NOTE — Telephone Encounter (Signed)
Fax number for General Surgery at [facility] is  [phone].

## 2012-09-08 NOTE — Telephone Encounter (Signed)
I don't know why she is going to general surgery? Will need to confirm with her.

## 2012-09-08 NOTE — Telephone Encounter (Signed)
OK. I will place referral. We can forward the surgeon the results of the US abdomen which showed stones in her gallbladder.

## 2012-09-08 NOTE — Telephone Encounter (Signed)
Tried to call patient back, would you happen to know why she is going to general surgery because she wants a referral put in and the records sent.

## 2012-09-08 NOTE — Telephone Encounter (Signed)
She is having extreme stomach issue and horrible acid reflux, she is having to see up in a recliner to sleep. She does have an overgrowth of yeast in her stomach and she is doing a now yeast diet. She is thinking that the gastritis is either coming from the yeast or the gallbladder. Found a doctor Dr. Francesco Sor at [facility] who is a surgeon instead of going through the general clinic she would like to see him to get her gall bladder removed.

## 2012-09-12 NOTE — Telephone Encounter (Signed)
Information has been faxed  

## 2012-10-27 ENCOUNTER — Ambulatory Visit (INDEPENDENT_AMBULATORY_CARE_PROVIDER_SITE_OTHER): Payer: Medicare Other | Admitting: Surgery

## 2012-10-27 ENCOUNTER — Encounter (INDEPENDENT_AMBULATORY_CARE_PROVIDER_SITE_OTHER): Payer: Self-pay | Admitting: Surgery

## 2012-10-27 VITALS — BP 120/70 | HR 74 | Temp 97.0°F | Resp 16 | Ht 62.5 in | Wt 134.2 lb

## 2012-10-27 DIAGNOSIS — K8020 Calculus of gallbladder without cholecystitis without obstruction: Secondary | ICD-10-CM

## 2012-10-27 NOTE — Progress Notes (Signed)
Re:   Yvonne Davidson DOB:   [birth year] MRN:   [id number]  ASSESSMENT AND PLAN: 1.  Gallstones  I discussed with the patient the indications and risks of gall bladder surgery.  The primary risks of gall bladder surgery include, but are not limited to, bleeding, infection, common bile duct injury, and open surgery.  There is also the risk that the patient may have continued symptoms (gastritis or GERD symptoms) after surgery.  In fact I said that is was unlikely to have a significant impact on those symptoms.   We discussed the typical post-operative recovery course. I tried to answer the patient's questions.  I gave the patient literature about gall bladder surgery.  She has gone back and forth about going ahead with gall bladder surgery.  I saw her in March 2012 and she decided at that time just to watch things.  She is now ready to go ahead with cholecystectomy.  2.  Hashimoto's thyroiditis - now with normal thyroid function 3.  Hypertension 4.  OSA  Has seen Dr. Joellyn Rued at [facility] 5.  GERD 6.  Hyperlipidemia 7.  Multiple allergies. 8.  Gastritis  Seen by Dr. Maggie Font [facility])  Chief Complaint  Patient presents with  . New Evaluation    eval GB   REFERRING PHYSICIAN: Wynona Dove, MD  HISTORY OF PRESENT ILLNESS: Yvonne Davidson is a 72 y.o. (DOB: 1941/01/08)  white  female whose primary care physician is Wynona Dove, MD and comes to me today for gall bladder disease.  I saw Yvonne Davidson in March 2012 for gall stones.  At that time, she did not want surgery and decided to watch things. She has had increasing foregut symptoms, with GERD and gastritis.  She has burning and reflux, particularly at night.  She says that she has taken "the gammit" of meds to resolve these symptoms.  She is taking probiotics and Zantac with some symptom improvement.  She had a upper endo by Dr. Lynnae Prude [facility]) 05/17/2012 with biopsies of her stomach which  showed "chronic gastritis" (no H. Pylori)  US abdomen - 06/14/2102 [facility]) - cholelithiasis  She is now wondering if the gall stones are contributing to her UGI symptoms.  We spent a good amount of time talking about continued observation of the the gallstones vs proceeding with surgery.  She thinks that is itime to go ahead with surgery.   Past Medical History  Diagnosis Date  . Chronic sinusitis   . Hashimoto's thyroiditis   . Hypertension   . Allergy   . Chronic bronchitis   . OSA (obstructive sleep apnea)   . Arthritis   . GERD (gastroesophageal reflux disease)   . Heart murmur   . Hyperlipidemia   . Osteoporosis     Past Surgical History  Procedure Laterality Date  . Nasal sinus surgery      Current Outpatient Prescriptions  Medication Sig Dispense Refill  . amLODipine (NORVASC) 5 MG tablet Take 5 mg by mouth daily.      . carboxymethylcellulose (REFRESH PLUS) 0.5 % SOLN 1 drop 3 (three) times daily as needed.      . cetirizine (ZYRTEC) 10 MG tablet Take 10 mg by mouth daily as needed.       Marland Kitchen losartan (COZAAR) 100 MG tablet TAKE 1 TABLET EVERY DAY  90 tablet  1  . Pancrelipase, Lip-Prot-Amyl, (CREON) 24000 UNITS CPEP Take 1 capsule (24,000 Units total) by mouth daily.  180 capsule  3  . Respiratory Therapy Supplies (ONE FLOW SPIROMETER) DEVI Please provide incentive spirometer DX: Asthma  1 each  0  . traMADol (ULTRAM) 50 MG tablet        No current facility-administered medications for this visit.      Allergies  Allergen Reactions  . Oxycodone Hcl Anaphylaxis    ALL NARCOTICS  . Sumycin [Tetracycline Hcl] Anaphylaxis  . Codeine   . Esomeprazole Magnesium   . Hydrochlorothiazide   . Indomethacin   . Keflex [Cephalexin] Other (See Comments)    Unknown, allergic to a lot of antibiotics  . Mold Extract [Trichophyton Mentagrophyte]   . Neomycin-Bacitracin Zn-Polymyx   . Procaine Hcl     TACHYCARDIA   . Pseudoephedrine Hcl Er   . Telithromycin   .  Tetracycline    Allergy to almost all narcotics.  REVIEW OF SYSTEMS: Skin:  No history of rash.  No history of abnormal moles. Infection:  No history of hepatitis or HIV.  No history of MRSA. Neurologic:  No history of stroke.  No history of seizure.  No history of headaches. Cardiac:  Hypertension.  Sees Dr. Harold Hedge [facility]) for cardiology. Pulmonary:  OSA - sees Dr. Joellyn Rued, [facility], for airway support.  She could not tolerate the CPAP because it blew her stomach up and made her reflux worse.  She is now using a "Winx" device.  Endocrine:  No diabetes. Hashimoto's thyroiditis - sees [place], [place], but office visit 08/21/2012 showed not thyroid disease. Gastrointestinal: See HPI.  No history of pancreas disease.  No history of colon disease.  05/17/2102 - upper endoscopy by Dr. Lynnae Prude [facility]) Urologic:  No history of kidney stones.  No history of bladder infections. Musculoskeletal: Old right forearm fracture with hardware. Hematologic:  No bleeding disorder.  No history of anemia.  Not anticoagulated. Psycho-social:  The patient is oriented.   The patient has no obvious psychologic or social impairment to understanding our conversation and plan.  SOCIAL and FAMILY HISTORY: Divorced, but has found the love of her life.  He is 40 and they are planning to marry in the future (a Mr. Loreta Ave) She has 4 children. She works at [employer] in [place] at [employer].  (She used to work at [employer] a while back)  PHYSICAL EXAM: BP 120/70  Pulse 74  Temp(Src) 97 F (36.1 C) (Temporal)  Resp 16  Ht 5' 2.5" (1.588 m)  Wt 134 lb 3.2 oz (60.873 kg)  BMI 24.14 kg/m2  General: WN WF who is alert and generally healthy appearing.  HEENT: Normal. Pupils equal. Neck: Supple. No mass.  No thyroid mass. Lymph Nodes:  No supraclavicular or cervical nodes. Lungs: Clear to auscultation and symmetric breath sounds. Heart:  RRR. No murmur or rub. Abdomen:  Soft. No mass. No tenderness. No hernia. Normal bowel sounds.  No abdominal scars. Rectal: Not done. Extremities:  Good strength and ROM  in upper and lower extremities.  Scar right forearm from prior fracture repair. Neurologic:  Grossly intact to motor and sensory function. Psychiatric: Has normal mood and affect. Behavior is normal.   DATA REVIEWED: Notes from [facility] physicians and her old chart.  Ovidio Kin, MD,  [facility] [address] [place]  [address]   [place], [place]    [address] Phone:  [phone] FAX:  [phone]

## 2012-11-02 ENCOUNTER — Ambulatory Visit (INDEPENDENT_AMBULATORY_CARE_PROVIDER_SITE_OTHER): Payer: Self-pay | Admitting: Surgery

## 2012-11-13 ENCOUNTER — Ambulatory Visit (INDEPENDENT_AMBULATORY_CARE_PROVIDER_SITE_OTHER): Payer: Medicare Other | Admitting: Adult Health

## 2012-11-13 ENCOUNTER — Encounter: Payer: Self-pay | Admitting: Adult Health

## 2012-11-13 VITALS — BP 128/60 | HR 61 | Temp 97.8°F | Resp 12 | Wt 133.0 lb

## 2012-11-13 DIAGNOSIS — J329 Chronic sinusitis, unspecified: Secondary | ICD-10-CM

## 2012-11-13 DIAGNOSIS — G4700 Insomnia, unspecified: Secondary | ICD-10-CM

## 2012-11-13 MED ORDER — TRAZODONE 25 MG HALF TABLET: 25.0000 mg | ORAL_TABLET | Freq: Every day | ORAL | Status: DC

## 2012-11-13 NOTE — Assessment & Plan Note (Addendum)
Patient uses a compounded bacitracin (mupirocin) in normal saline and irrigates sinuses. Prescription called in to [facility] pharmacy

## 2012-11-13 NOTE — Patient Instructions (Signed)
  Start trazodone 25 mg at bedtime. The tablets are 50 mg so cut them in half.   Take approximately 30 min prior to bedtime.  Please call with any questions or concerns.

## 2012-11-13 NOTE — Progress Notes (Signed)
  Subjective:    Patient ID: Yvonne Davidson, female    DOB: 07-Apr-1940, 72 y.o.   MRN: [id number]  HPI  Patient presents to clinic with c/o increased stress at work. She works at [employer] and is overwhelmed with the treatment she has been receiving there. Patient reports this has been ongoing for several years but it has been worse recently. The stress is causing her insomnia. She falls asleep and then wakes up approximately at 1:00 or 2:00 in the morning and is unable to return to sleep. Patient reports that she needs something to help her sleep. She has tried Benadryl but this has too much of a lingering effect in the morning.  Patient also reports that she has a sinus infection and is blowing brown mucus from her nose. She uses a compounded medication of bacitracin in normal saline. She is requesting that we called this in to [facility] pharmacy which is who compounds that for her. She denies fever or chills. She has some pressure within her sinuses.   Current Outpatient Prescriptions on File Prior to Visit  Medication Sig Dispense Refill  . amLODipine (NORVASC) 5 MG tablet Take 5 mg by mouth daily.      . carboxymethylcellulose (REFRESH PLUS) 0.5 % SOLN 1 drop 3 (three) times daily as needed.      . cetirizine (ZYRTEC) 10 MG tablet Take 10 mg by mouth daily as needed.       Marland Kitchen losartan (COZAAR) 100 MG tablet TAKE 1 TABLET EVERY DAY  90 tablet  1  . Pancrelipase, Lip-Prot-Amyl, (CREON) 24000 UNITS CPEP Take 1 capsule (24,000 Units total) by mouth daily.  180 capsule  3  . Respiratory Therapy Supplies (ONE FLOW SPIROMETER) DEVI Please provide incentive spirometer DX: Asthma  1 each  0   No current facility-administered medications on file prior to visit.    Review of Systems  Constitutional: Negative for fever and chills.  HENT: Positive for postnasal drip and sinus pressure. Negative for sore throat.        Brown drainage from sinuses  Respiratory: Negative.   Cardiovascular:  Negative.   Psychiatric/Behavioral: Positive for sleep disturbance. Negative for behavioral problems and confusion. The patient is nervous/anxious.        Objective:   Physical Exam  Constitutional: She is oriented to person, place, and time. She appears well-developed and well-nourished. No distress.  HENT:  Head: Normocephalic and atraumatic.  Mouth/Throat: Oropharynx is clear and moist.  Cardiovascular: Normal rate and regular rhythm.   Pulmonary/Chest: Effort normal. No respiratory distress.  Neurological: She is alert and oriented to person, place, and time.  Psychiatric: She has a normal mood and affect. Her behavior is normal. Judgment and thought content normal.  Appears slightly anxious related to insomnia    BP 128/60  Pulse 61  Temp(Src) 97.8 F (36.6 C) (Oral)  Resp 12  Wt 133 lb (60.328 kg)  BMI 23.92 kg/m2  SpO2 97%       Assessment & Plan:

## 2012-11-13 NOTE — Assessment & Plan Note (Signed)
Start trazodone 25 mg at bedtime.

## 2012-11-16 ENCOUNTER — Ambulatory Visit: Admit: 2012-11-16 | Payer: Medicare Other | Admitting: Surgery

## 2012-11-16 SURGERY — LAPAROSCOPIC CHOLECYSTECTOMY WITH INTRAOPERATIVE CHOLANGIOGRAM
Anesthesia: General

## 2012-11-24 ENCOUNTER — Telehealth (INDEPENDENT_AMBULATORY_CARE_PROVIDER_SITE_OTHER): Payer: Self-pay

## 2012-11-24 NOTE — Telephone Encounter (Signed)
V/M P/O appt 12/27/12@12 :15 with Dr. Ezzard Standing ,call to confirm

## 2012-11-29 ENCOUNTER — Encounter (HOSPITAL_COMMUNITY): Payer: Self-pay

## 2012-11-29 ENCOUNTER — Encounter (HOSPITAL_COMMUNITY)
Admission: RE | Admit: 2012-11-29 | Discharge: 2012-11-29 | Disposition: A | Discharge status: Home / Self Care | Payer: Medicare Other | Source: Ambulatory Visit | Attending: Surgery | Admitting: Surgery

## 2012-11-29 ENCOUNTER — Ambulatory Visit (HOSPITAL_COMMUNITY)
Admission: RE | Admit: 2012-11-29 | Discharge: 2012-11-29 | Disposition: A | Discharge status: Home / Self Care | Payer: Medicare Other | Source: Ambulatory Visit | Attending: Anesthesiology | Admitting: Anesthesiology

## 2012-11-29 DIAGNOSIS — Z01812 Encounter for preprocedural laboratory examination: Secondary | ICD-10-CM | POA: Insufficient documentation

## 2012-11-29 DIAGNOSIS — I10 Essential (primary) hypertension: Secondary | ICD-10-CM | POA: Insufficient documentation

## 2012-11-29 DIAGNOSIS — Z01818 Encounter for other preprocedural examination: Secondary | ICD-10-CM | POA: Insufficient documentation

## 2012-11-29 DIAGNOSIS — E119 Type 2 diabetes mellitus without complications: Secondary | ICD-10-CM | POA: Insufficient documentation

## 2012-11-29 HISTORY — DX: Depression, unspecified: F32.A

## 2012-11-29 HISTORY — DX: Other specified postprocedural states: Z98.890

## 2012-11-29 HISTORY — DX: Insomnia, unspecified: G47.00

## 2012-11-29 HISTORY — DX: Nausea with vomiting, unspecified: R11.2

## 2012-11-29 HISTORY — DX: Major depressive disorder, single episode, unspecified: F32.9

## 2012-11-29 HISTORY — DX: Diverticulosis of intestine, part unspecified, without perforation or abscess without bleeding: K57.90

## 2012-11-29 HISTORY — DX: Anxiety disorder, unspecified: F41.9

## 2012-11-29 HISTORY — DX: Unspecified cataract: H26.9

## 2012-11-29 HISTORY — DX: Anemia, unspecified: D64.9

## 2012-11-29 HISTORY — DX: Type 2 diabetes mellitus without complications: E11.9

## 2012-11-29 HISTORY — DX: Dry eye syndrome of bilateral lacrimal glands: H04.123

## 2012-11-29 HISTORY — DX: Personal history of other medical treatment: Z92.89

## 2012-11-29 LAB — COMPREHENSIVE METABOLIC PANEL
ALT: 16 U/L (ref 0–35)
AST: 19 U/L (ref 0–37)
Alkaline Phosphatase: 78 U/L (ref 39–117)
CO2: 26 mEq/L (ref 19–32)
Chloride: 102 mEq/L (ref 96–112)
Creatinine, Ser: 0.8 mg/dL (ref 0.50–1.10)
GFR calc Af Amer: 83 mL/min — ABNORMAL LOW (ref >90)
GFR calc non Af Amer: 72 mL/min — ABNORMAL LOW (ref >90)
Glucose, Bld: 114 mg/dL — ABNORMAL HIGH (ref 70–99)
Sodium: 138 mEq/L (ref 135–145)
Total Bilirubin: 0.1 mg/dL — ABNORMAL LOW (ref 0.3–1.2)

## 2012-11-29 LAB — CBC WITH DIFFERENTIAL/PLATELET
Basophils Absolute: 0 10*3/uL (ref 0.0–0.1)
Basophils Relative: 0 % (ref 0–1)
HCT: 38 % (ref 36.0–46.0)
Lymphocytes Relative: 30 % (ref 12–46)
Lymphs Abs: 2.6 10*3/uL (ref 0.7–4.0)
Monocytes Absolute: 0.5 10*3/uL (ref 0.1–1.0)
Neutro Abs: 5.4 10*3/uL (ref 1.7–7.7)
Platelets: 276 10*3/uL (ref 150–400)
RBC: 4.19 MIL/uL (ref 3.87–5.11)
RDW: 14 % (ref 11.5–15.5)
WBC: 8.8 10*3/uL (ref 4.0–10.5)

## 2012-11-29 MED ORDER — CHLORHEXIDINE GLUCONATE 4 % EX LIQD: 1.0000 "application " | Freq: Once | CUTANEOUS | Status: DC

## 2012-11-29 NOTE — Progress Notes (Signed)
Anesthesia PAT Evaluation:  Patient is a 72 year old female scheduled for laparoscopic cholecystectomy on 12/07/12 by Dr. Ovidio Kin.  I spoke with her during her PAT visit yesterday.  History includes former smoker, Hashimoto's thyroiditis (her last endocrinology notes from 08/21/12 with Dr. Wendall Mola indicate that there is no evidence of a thyroid disorder and had normal thyroid labs and a normal ultrasound 08/2011), anxiety, HTN, OSA, GERD, HLD, murmur, chronic bronchitis, arthritis. PCP is Dr. Ronna Polio.  She has also seen cardiologist Dr. Harold Hedge for HTN management, last visit 03/20/12.  OSA is followed by Dr. Vista Lawman at [facility].  She has been intolerant to CPAP due to gastric distention and has been using the "Winx System" instead.  Meds: Creon, Norvasc, Zyrtec, Cozaar, trazodone. She has multiple allergies including Latex and procaine.  EKG on 07/10/12 showed SB at 48 bpm.  Echo on 06/01/12 at [facility] showed normal LV systolic function with EF greater than 55%, mild LVH, normal RV systolic function, mild valvular regurgitation (mild MR, trivial AR/TR), no valvular stenosis, no pericardial effusion, mildly dilated left atrium, mild to moderate pulmonary hypertension with a peak RV pressure 46.8 mmHg.  CXR on 11/29/12 showed no acute abnormality.  PFTs on 02/15/12 showed FVC 2.67 (989%), FEV1 1.98 (95%).  Preoperative labs noted.  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  She is also worried about pain medications due to her multiple allergies including to codeine and oxycodone.  It looks like she was given dilaudid without problems in 2006.  I encouraged her to talk further with Dr. Ezzard Standing about her post-opreative oral pain  mediciations.  She also has concerns about her OSA since she has not really been able to tolerate CPAP.  I told her it is likely RT could be consulted to see her post-operatively.  She states that Dr. Ezzard Standing is planning to keep her overnight.    Velna Ochs [facility] Short Stay Center/Anesthesiology Phone [phone] 11/30/2012 9:18 AM

## 2012-11-29 NOTE — Progress Notes (Addendum)
Dr.Fath in [place] is Cardiologist--sees him yearly-LAST VISIT IN EPIC UNDER MEDIA FROM 04/2012  Denies ever having a stress test/heart cath/echo   DR.Jennifer Dan Humphreys is MEdical Md in [place]  EKG in epic from 07-08-12  Denies CXR in past yr  Sleep study done in July 2014-at [facility]-to request from [employer] [phone]

## 2012-11-29 NOTE — Pre-Procedure Instructions (Signed)
CHERYLYNN LISZEWSKI  11/29/2012   Your procedure is scheduled on:  Thurs, Nov 13 @ 7:30 AM  Report to [facility] Short Stay Entrance A at 5:30 AM.  Call this number if you have problems the morning of surgery: [phone]   Remember:   Do not eat food or drink liquids after midnight.   Take these medicines the morning of surgery with A SIP OF WATER: Amlodipine(Norvasc) and               Zyrtec(Cetirizine-if needed)               No Goody's,BC's,Aleve,Ibuprofen,Aspirin,Fish Oil,or any HErbal Medications   Do not wear jewelry, make-up or nail polish.  Do not wear lotions, powders, or perfumes. You may wear deodorant.  Do not shave 48 hours prior to surgery.   Do not bring valuables to the hospital.  [facility] is not responsible                  for any belongings or valuables.               Contacts, dentures or bridgework may not be worn into surgery.  Leave suitcase in the car. After surgery it may be brought to your room.  For patients admitted to the hospital, discharge time is determined by your                treatment team.               Patients discharged the day of surgery will not be allowed to drive  home.    Special Instructions: Shower using CHG 2 nights before surgery and the night before surgery.  If you shower the day of surgery use CHG.  Use special wash - you have one bottle of CHG for all showers.  You should use approximately 1/3 of the bottle for each shower.   Please read over the following fact sheets that you were given: Pain Booklet, Coughing and Deep Breathing and Surgical Site Infection Prevention

## 2012-12-07 ENCOUNTER — Observation Stay (HOSPITAL_COMMUNITY)
Admission: RE | Admit: 2012-12-07 | Discharge: 2012-12-08 | Disposition: A | Discharge status: Home / Self Care | Payer: Medicare Other | Source: Ambulatory Visit | Attending: Surgery | Admitting: Surgery

## 2012-12-07 ENCOUNTER — Encounter (HOSPITAL_COMMUNITY): Payer: Medicare Other | Admitting: Vascular Surgery

## 2012-12-07 ENCOUNTER — Ambulatory Visit (HOSPITAL_COMMUNITY): Payer: Medicare Other

## 2012-12-07 ENCOUNTER — Encounter (HOSPITAL_COMMUNITY)
Admission: RE | Disposition: A | Discharge status: Home / Self Care | Payer: Self-pay | Source: Ambulatory Visit | Attending: Surgery

## 2012-12-07 ENCOUNTER — Ambulatory Visit (HOSPITAL_COMMUNITY): Payer: Medicare Other | Admitting: Anesthesiology

## 2012-12-07 ENCOUNTER — Encounter (HOSPITAL_COMMUNITY): Payer: Self-pay | Admitting: General Practice

## 2012-12-07 DIAGNOSIS — K2970 Gastritis, unspecified, without bleeding: Secondary | ICD-10-CM | POA: Insufficient documentation

## 2012-12-07 DIAGNOSIS — Z91040 Latex allergy status: Secondary | ICD-10-CM | POA: Insufficient documentation

## 2012-12-07 DIAGNOSIS — K8010 Calculus of gallbladder with chronic cholecystitis without obstruction: Secondary | ICD-10-CM

## 2012-12-07 DIAGNOSIS — E785 Hyperlipidemia, unspecified: Secondary | ICD-10-CM | POA: Insufficient documentation

## 2012-12-07 DIAGNOSIS — K219 Gastro-esophageal reflux disease without esophagitis: Secondary | ICD-10-CM | POA: Insufficient documentation

## 2012-12-07 DIAGNOSIS — G4733 Obstructive sleep apnea (adult) (pediatric): Secondary | ICD-10-CM | POA: Insufficient documentation

## 2012-12-07 DIAGNOSIS — K8020 Calculus of gallbladder without cholecystitis without obstruction: Secondary | ICD-10-CM

## 2012-12-07 DIAGNOSIS — I10 Essential (primary) hypertension: Secondary | ICD-10-CM | POA: Insufficient documentation

## 2012-12-07 HISTORY — PX: CHOLECYSTECTOMY: SHX55

## 2012-12-07 LAB — GLUCOSE, CAPILLARY: Glucose-Capillary: 116 mg/dL — ABNORMAL HIGH (ref 70–99)

## 2012-12-07 SURGERY — LAPAROSCOPIC CHOLECYSTECTOMY WITH INTRAOPERATIVE CHOLANGIOGRAM
Anesthesia: General | Site: Abdomen | Wound class: Clean Contaminated

## 2012-12-07 MED ORDER — LIDOCAINE HCL (CARDIAC) 20 MG/ML IV SOLN
INTRAVENOUS | Status: DC | PRN
  Administered 2012-12-07: 60 mg via INTRAVENOUS

## 2012-12-07 MED ORDER — CARBOXYMETHYLCELLULOSE SODIUM 0.5 % OP SOLN: 1.0000 [drp] | Freq: Every day | OPHTHALMIC | Status: DC | PRN

## 2012-12-07 MED ORDER — IOHEXOL 300 MG/ML  SOLN
INTRAMUSCULAR | Status: DC | PRN
  Administered 2012-12-07: 07:00:00

## 2012-12-07 MED ORDER — CEFAZOLIN SODIUM 1-5 GM-% IV SOLN
INTRAVENOUS | Status: AC
  Administered 2012-12-07: 1 g via INTRAVENOUS
  Filled 2012-12-07: qty 50

## 2012-12-07 MED ORDER — LOSARTAN POTASSIUM 50 MG PO TABS
100.0000 mg | ORAL_TABLET | Freq: Every day | ORAL | Status: DC
  Administered 2012-12-07: 100 mg via ORAL
  Filled 2012-12-07 (×2): qty 2

## 2012-12-07 MED ORDER — HYDROMORPHONE HCL PF 1 MG/ML IJ SOLN: 0.2500 mg | INTRAMUSCULAR | Status: DC | PRN

## 2012-12-07 MED ORDER — GLYCOPYRROLATE 0.2 MG/ML IJ SOLN
INTRAMUSCULAR | Status: DC | PRN
  Administered 2012-12-07: 0.2 mg via INTRAVENOUS
  Administered 2012-12-07: 0.4 mg via INTRAVENOUS

## 2012-12-07 MED ORDER — MIDAZOLAM HCL 2 MG/2ML IJ SOLN: 1.0000 mg | INTRAMUSCULAR | Status: DC | PRN

## 2012-12-07 MED ORDER — KCL IN DEXTROSE-NACL 20-5-0.45 MEQ/L-%-% IV SOLN
INTRAVENOUS | Status: DC
  Administered 2012-12-07: 13:00:00 via INTRAVENOUS
  Filled 2012-12-07 (×4): qty 1000

## 2012-12-07 MED ORDER — FENTANYL CITRATE 0.05 MG/ML IJ SOLN: 50.0000 ug | Freq: Once | INTRAMUSCULAR | Status: DC

## 2012-12-07 MED ORDER — PROPOFOL 10 MG/ML IV BOLUS
INTRAVENOUS | Status: DC | PRN
  Administered 2012-12-07: 120 mg via INTRAVENOUS

## 2012-12-07 MED ORDER — NEOSTIGMINE METHYLSULFATE 1 MG/ML IJ SOLN
INTRAMUSCULAR | Status: DC | PRN
  Administered 2012-12-07: 3 mg via INTRAVENOUS

## 2012-12-07 MED ORDER — CLONIDINE HCL 0.1 MG PO TABS
0.1000 mg | ORAL_TABLET | Freq: Every day | ORAL | Status: DC
  Administered 2012-12-07: 0.1 mg via ORAL
  Filled 2012-12-07 (×2): qty 1

## 2012-12-07 MED ORDER — ACETAMINOPHEN 325 MG PO TABS: 650.0000 mg | ORAL_TABLET | ORAL | Status: DC | PRN

## 2012-12-07 MED ORDER — CLONIDINE HCL 0.1 MG PO TABS
0.0500 mg | ORAL_TABLET | Freq: Every day | ORAL | Status: DC
  Administered 2012-12-07: 0.05 mg via ORAL
  Filled 2012-12-07: qty 1
  Filled 2012-12-07: qty 0.5

## 2012-12-07 MED ORDER — BUPIVACAINE HCL (PF) 0.25 % IJ SOLN
INTRAMUSCULAR | Status: AC
  Filled 2012-12-07: qty 30

## 2012-12-07 MED ORDER — ONDANSETRON HCL 4 MG/2ML IJ SOLN
INTRAMUSCULAR | Status: DC | PRN
  Administered 2012-12-07: 4 mg via INTRAVENOUS

## 2012-12-07 MED ORDER — ONDANSETRON HCL 4 MG PO TABS: 4.0000 mg | ORAL_TABLET | Freq: Four times a day (QID) | ORAL | Status: DC | PRN

## 2012-12-07 MED ORDER — GLYCOPYRROLATE 0.2 MG/ML IJ SOLN
INTRAMUSCULAR | Status: AC
  Filled 2012-12-07: qty 1

## 2012-12-07 MED ORDER — HEPARIN SODIUM (PORCINE) 5000 UNIT/ML IJ SOLN
5000.0000 [IU] | Freq: Three times a day (TID) | INTRAMUSCULAR | Status: DC
  Administered 2012-12-07 – 2012-12-08 (×3): 5000 [IU] via SUBCUTANEOUS
  Filled 2012-12-07 (×6): qty 1

## 2012-12-07 MED ORDER — TRAZODONE 25 MG HALF TABLET
25.0000 mg | ORAL_TABLET | Freq: Every day | ORAL | Status: DC
  Filled 2012-12-07 (×2): qty 1

## 2012-12-07 MED ORDER — TRAMADOL HCL 50 MG PO TABS: 100.0000 mg | ORAL_TABLET | Freq: Two times a day (BID) | ORAL | Status: DC | PRN

## 2012-12-07 MED ORDER — HYDROMORPHONE HCL PF 1 MG/ML IJ SOLN: 0.5000 mg | INTRAMUSCULAR | Status: DC | PRN

## 2012-12-07 MED ORDER — FENTANYL CITRATE 0.05 MG/ML IJ SOLN
INTRAMUSCULAR | Status: DC | PRN
  Administered 2012-12-07: 50 ug via INTRAVENOUS
  Administered 2012-12-07: 100 ug via INTRAVENOUS
  Administered 2012-12-07: 50 ug via INTRAVENOUS

## 2012-12-07 MED ORDER — SALINE SPRAY 0.65 % NA SOLN
1.0000 | Freq: Every day | NASAL | Status: DC | PRN
  Filled 2012-12-07: qty 44

## 2012-12-07 MED ORDER — LACTATED RINGERS IV SOLN
INTRAVENOUS | Status: DC | PRN
  Administered 2012-12-07: 07:00:00 via INTRAVENOUS

## 2012-12-07 MED ORDER — BUPIVACAINE HCL 0.25 % IJ SOLN
INTRAMUSCULAR | Status: DC | PRN
  Administered 2012-12-07: 30 mL

## 2012-12-07 MED ORDER — ONDANSETRON HCL 4 MG/2ML IJ SOLN: 4.0000 mg | Freq: Four times a day (QID) | INTRAMUSCULAR | Status: DC | PRN

## 2012-12-07 MED ORDER — SODIUM CHLORIDE 0.9 % IR SOLN
Status: DC | PRN
  Administered 2012-12-07: 1000 mL

## 2012-12-07 MED ORDER — PANCRELIPASE (LIP-PROT-AMYL) 12000-38000 UNITS PO CPEP
1.0000 | ORAL_CAPSULE | Freq: Three times a day (TID) | ORAL | Status: DC
  Administered 2012-12-08: 1 via ORAL
  Filled 2012-12-07 (×4): qty 1

## 2012-12-07 MED ORDER — 0.9 % SODIUM CHLORIDE (POUR BTL) OPTIME
TOPICAL | Status: DC | PRN
  Administered 2012-12-07: 1000 mL

## 2012-12-07 MED ORDER — MIDAZOLAM HCL 5 MG/5ML IJ SOLN
INTRAMUSCULAR | Status: DC | PRN
  Administered 2012-12-07: 0.5 mg via INTRAVENOUS

## 2012-12-07 MED ORDER — GLYCOPYRROLATE 0.2 MG/ML IJ SOLN
0.2000 mg | Freq: Once | INTRAMUSCULAR | Status: AC
  Administered 2012-12-07: 0.2 mg via INTRAVENOUS

## 2012-12-07 MED ORDER — ROCURONIUM BROMIDE 100 MG/10ML IV SOLN
INTRAVENOUS | Status: DC | PRN
  Administered 2012-12-07: 35 mg via INTRAVENOUS

## 2012-12-07 MED ORDER — POLYVINYL ALCOHOL 1.4 % OP SOLN
1.0000 [drp] | OPHTHALMIC | Status: DC | PRN
  Filled 2012-12-07: qty 15

## 2012-12-07 SURGICAL SUPPLY — 47 items
APPLIER CLIP 5 13 M/L LIGAMAX5 (MISCELLANEOUS) ×2
APPLIER CLIP ROT 10 11.4 M/L (STAPLE)
BLADE SURG ROTATE 9660 (MISCELLANEOUS) IMPLANT
CANISTER SUCTION 2500CC (MISCELLANEOUS) ×2 IMPLANT
CHLORAPREP W/TINT 26ML (MISCELLANEOUS) ×2 IMPLANT
CHOLANGIOGRAM CATH TAUT (CATHETERS) ×2 IMPLANT
CLIP APPLIE 5 13 M/L LIGAMAX5 (MISCELLANEOUS) ×1 IMPLANT
CLIP APPLIE ROT 10 11.4 M/L (STAPLE) IMPLANT
COVER MAYO STAND STRL (DRAPES) ×2 IMPLANT
COVER SURGICAL LIGHT HANDLE (MISCELLANEOUS) ×2 IMPLANT
DECANTER SPIKE VIAL GLASS SM (MISCELLANEOUS) IMPLANT
DERMABOND ADVANCED (GAUZE/BANDAGES/DRESSINGS) ×1
DERMABOND ADVANCED .7 DNX12 (GAUZE/BANDAGES/DRESSINGS) ×1 IMPLANT
DRAPE C-ARM 42X72 X-RAY (DRAPES) ×2 IMPLANT
DRAPE UTILITY 15X26 W/TAPE STR (DRAPE) ×4 IMPLANT
ELECT REM PT RETURN 9FT ADLT (ELECTROSURGICAL) ×2
ELECTRODE REM PT RTRN 9FT ADLT (ELECTROSURGICAL) ×1 IMPLANT
FILTER SMOKE EVAC LAPAROSHD (FILTER) ×2 IMPLANT
GLOVE BIOGEL PI IND STRL 7.0 (GLOVE) ×1 IMPLANT
GLOVE BIOGEL PI IND STRL 7.5 (GLOVE) ×1 IMPLANT
GLOVE BIOGEL PI INDICATOR 7.0 (GLOVE) ×1
GLOVE BIOGEL PI INDICATOR 7.5 (GLOVE) ×1
GLOVE SURG SIGNA 7.5 PF LTX (GLOVE) IMPLANT
GLOVE SURG SS PI 7.0 STRL IVOR (GLOVE) ×2 IMPLANT
GLOVE SURG SS PI 7.5 STRL IVOR (GLOVE) ×8 IMPLANT
GOWN STRL NON-REIN LRG LVL3 (GOWN DISPOSABLE) ×4 IMPLANT
GOWN STRL REIN XL XLG (GOWN DISPOSABLE) ×4 IMPLANT
IV CATH 14GX2 1/4 (CATHETERS) ×2 IMPLANT
KIT BASIN OR (CUSTOM PROCEDURE TRAY) ×2 IMPLANT
KIT ROOM TURNOVER OR (KITS) ×2 IMPLANT
NS IRRIG 1000ML POUR BTL (IV SOLUTION) ×2 IMPLANT
PAD ARMBOARD 7.5X6 YLW CONV (MISCELLANEOUS) ×2 IMPLANT
POUCH SPECIMEN RETRIEVAL 10MM (ENDOMECHANICALS) ×2 IMPLANT
SCISSORS LAP 5X35 DISP (ENDOMECHANICALS) IMPLANT
SET IRRIG TUBING LAPAROSCOPIC (IRRIGATION / IRRIGATOR) ×2 IMPLANT
SPECIMEN JAR SMALL (MISCELLANEOUS) ×2 IMPLANT
STOPCOCK 4 WAY LG BORE MALE ST (IV SETS) ×2 IMPLANT
SUT VIC AB 5-0 PS2 18 (SUTURE) ×2 IMPLANT
SUT VICRYL 0 UR6 27IN ABS (SUTURE) ×2 IMPLANT
TOWEL OR 17X24 6PK STRL BLUE (TOWEL DISPOSABLE) ×2 IMPLANT
TOWEL OR 17X26 10 PK STRL BLUE (TOWEL DISPOSABLE) ×2 IMPLANT
TRAY LAPAROSCOPIC (CUSTOM PROCEDURE TRAY) ×2 IMPLANT
TROCAR XCEL BLUNT TIP 100MML (ENDOMECHANICALS) ×2 IMPLANT
TROCAR XCEL NON-BLD 11X100MML (ENDOMECHANICALS) IMPLANT
TROCAR XCEL NON-BLD 5MMX100MML (ENDOMECHANICALS) ×6 IMPLANT
TUBING EXTENTION W/L.L. (IV SETS) ×2 IMPLANT
WATER STERILE IRR 1000ML POUR (IV SOLUTION) IMPLANT

## 2012-12-07 NOTE — H&P (Signed)
Re: Yvonne Davidson  DOB: 07/20/1940  MRN: [id number]   ASSESSMENT AND PLAN:  1. Gallstones   I discussed with the patient the indications and risks of gall bladder surgery. The primary risks of gall bladder surgery include, but are not limited to, bleeding, infection, common bile duct injury, and open surgery. There is also the risk that the patient may have continued symptoms (gastritis or GERD symptoms) after surgery. In fact I said that is was unlikely to have a significant impact on those symptoms. We discussed the typical post-operative recovery course. I tried to answer the patient's questions.   I gave the patient literature about gall bladder surgery.   She has gone back and forth about going ahead with gall bladder surgery. I saw her in March 72 and she decided at that time just to watch things. She is now ready to go ahead with cholecystectomy.  Francis Dowse has a friend with her today.]  2. Hashimoto's thyroiditis - now with normal thyroid function  3. Hypertension  4. OSA   Has [facility]n [facility]  5. GERD  6. Hyperlipidemia  7. Multiple allergies.  8. Gastritis  Seen by Dr. Maggie Font [facility])  Chief Complaint   Patient presents with   .  New Evaluation     eval GB    REFERRING PHYSICIAN: Wynona Dove, MD   HISTORY OF PRESENT ILLNESS:  Yvonne Davidson is a 72 y.o. (DOB: 1940-09-20) white female whose primary care physician is Wynona Dove, MD and comes to me today for gall bladder disease.   I saw [name] in March 2012 for gall stones. At that time, she did not want surgery and decided to watch things. She has had increasing foregut symptoms, with GERD and gastritis. She has burning and reflux, particularly at night. She says that she has taken "the gammit" of meds to resolve these symptoms. She is taking probiotics and Zantac with some symptom improvement. She had a upper endo by Dr. Lynnae Prude [facility]) 05/17/2012 with  biopsies of her stomach which showed "chronic gastritis" (no H. Pylori)   US abdomen - 06/14/2102 [facility]) - cholelithiasis   She is now wondering if the gall stones are contributing to her UGI symptoms. We spent a good amount of time talking about continued observation of the the gallstones vs proceeding with surgery. She thinks that is itime to go ahead with surgery.   Past Medical History   Diagnosis  Date   .  Chronic sinusitis    .  Hashimoto's thyroiditis    .  Hypertension    .  Allergy    .  Chronic bronchitis    .  OSA (obstructive sleep apnea)    .  Arthritis    .  GERD (gastroesophageal reflux disease)    .  Heart murmur    .  Hyperlipidemia    .  Osteoporosis     Past Surgical History   Procedure  Laterality  Date   .  Nasal sinus surgery      Current Outpatient Prescriptions   Medication  Sig  Dispense  Refill   .  amLODipine (NORVASC) 5 MG tablet  Take 5 mg by mouth daily.     .  carboxymethylcellulose (REFRESH PLUS) 0.5 % SOLN  1 drop 3 (three) times daily as needed.     .  cetirizine (ZYRTEC) 10 MG tablet  Take 10 mg by mouth daily as needed.     Marland Kitchen  losartan (COZAAR) 100 MG tablet  TAKE 1 TABLET EVERY DAY  90 tablet  1   .  Pancrelipase, Lip-Prot-Amyl, (CREON) 24000 UNITS CPEP  Take 1 capsule (24,000 Units total) by mouth daily.  180 capsule  3   .  Respiratory Therapy Supplies (ONE FLOW SPIROMETER) DEVI  Please provide incentive spirometer DX: Asthma  1 each  0   .  traMADol (ULTRAM) 50 MG tablet       No current facility-administered medications for this visit.    Allergies   Allergen  Reactions   .  Oxycodone Hcl  Anaphylaxis     ALL NARCOTICS   .  Sumycin [Tetracycline Hcl]  Anaphylaxis   .  Codeine    .  Esomeprazole Magnesium    .  Hydrochlorothiazide    .  Indomethacin    .  Keflex [Cephalexin]  Other (See Comments)     Unknown, allergic to a lot of antibiotics   .  Mold Extract [Trichophyton Mentagrophyte]    .  Neomycin-Bacitracin  Zn-Polymyx    .  Procaine Hcl      TACHYCARDIA   .  Pseudoephedrine Hcl Er    .  Telithromycin    .  Tetracycline    Allergy to almost all narcotics.  She says she can take dilaudid.  Has not had Ultram.  Can take tylenol.  REVIEW OF SYSTEMS:  Skin: No history of rash. No history of abnormal moles.  Infection: No history of hepatitis or HIV. No history of MRSA.  Neurologic: No history of stroke. No history of seizure. No history of headaches.  Cardiac: Hypertension. Sees Dr. Harold Hedge [facility]) for cardiology.  Pulmonary: OSA - sees Dr. Joellyn Rued, [facility], for airway support. She could not tolerate the CPAP because it blew her stomach up and made her reflux worse. She is now using a "Winx" device.  Endocrine: No diabetes. Hashimoto's thyroiditis - sees [place], [place], but office visit 08/21/2012 showed not thyroid disease.  Gastrointestinal: See HPI. No history of pancreas disease. No history of colon disease. 05/17/2102 - upper endoscopy by Dr. Lynnae Prude [facility])  Urologic: No history of kidney stones. No history of bladder infections.  Musculoskeletal: Old right forearm fracture with hardware.  Hematologic: No bleeding disorder. No history of anemia. Not anticoagulated.  Psycho-social: The patient is oriented. The patient has no obvious psychologic or social impairment to understanding our conversation and plan.   SOCIAL and FAMILY HISTORY:  Divorced, but has found the love of her life. He is 63 and they are planning to marry in the future (a Mr. Loreta Ave)  She has 4 children.  She works at [employer] in [place] at [employer]. (She used to work at [employer] a while back)   PHYSICAL EXAM:  BP 181/63  Pulse 65  Temp(Src) 97.1 F (36.2 C) (Oral)  Resp 18  SpO2 99%  Ht 5' 2.5" (1.588 m)  Wt 134 lb 3.2 oz (60.873 kg)  BMI 24.14 kg/m2  General: WN WF who is alert and generally healthy appearing.  HEENT: Normal. Pupils equal.  Neck: Supple. No  mass. No thyroid mass.  Lymph Nodes: No supraclavicular or cervical nodes.  Lungs: Clear to auscultation and symmetric breath sounds.  Heart: RRR. No murmur or rub.  Abdomen: Soft. No mass. No tenderness. No hernia. Normal bowel sounds. No abdominal scars.  Rectal: Not done.  Extremities: Good strength and ROM in upper and lower extremities. Scar right forearm from prior  fracture repair.  Neurologic: Grossly intact to motor and sensory function.  Psychiatric: Has normal mood and affect. Behavior is normal.   DATA REVIEWED:  Notes from [facility] physicians and her old chart.   Ovidio Kin, MD, [facility]  [address] [place]  [place], [place] [address]  Phone: [phone] FAX: [phone]

## 2012-12-07 NOTE — Op Note (Signed)
12/07/2012  8:37 AM  PATIENT:  Yvonne Davidson, 72 y.o., female, MRN: [id number]  PREOP DIAGNOSIS:  GALLSTONES  POSTOP DIAGNOSIS:   Chronic cholecystitis, cholelithiasis  PROCEDURE:   Procedure(s): LAPAROSCOPIC CHOLECYSTECTOMY WITH INTRAOPERATIVE CHOLANGIOGRAM  SURGEON:   Ovidio Kin, M.D.  Threasa HeadsTrude Mcburney, M.D.  ANESTHESIA:   general  Anesthesiologist: Bedelia Person, MD CRNA: Ellin Goodie, CRNA  General  ASA:  III  EBL:  Minimal  ml  BLOOD ADMINISTERED: none  DRAINS: none   LOCAL MEDICATIONS USED:   30 cc 1/4% marcaine  SPECIMEN:   Gall bladder  COUNTS CORRECT:  YES  INDICATIONS FOR PROCEDURE:  Yvonne Davidson is a 72 y.o. (DOB: 1940/02/09) white  female whose primary care physician is Wynona Dove, MD and comes for cholecystectomy.   The indications and risks of the gall bladder surgery were explained to the patient.  The risks include, but are not limited to, infection, bleeding, common bile duct injury and open surgery.  SURGERY:  The patient was taken to room #1 at [facility].  The abdomen was prepped with chloroprep.  The patient was given 2 gm Ancef at the beginning of the operation.   The patient has a Latex allergy.   A time out was held and the surgical checklist run.   An infraumbilical incision was made into the abdominal cavity.  A 12 mm Hasson trocar was inserted into the abdominal cavity through the infraumbilical incision and secured with a 0 Vicryl suture.  Three additional trocars were inserted: a 5 mm trocar in the sub-xiphoid location, a 5 mm trocar in the right mid subcostal area, and a 5 mm trocar in the right lateral subcostal area.   The abdomen was explored and the liver, stomach, and bowel that could be seen were unremarkable.  She did have some adhesions of her right colon to the anterior abdominal wall.  These were taken down sharply, but were not really in the operative field.   The gall bladder was identified,  grasped, and rotated cephalad.  Disssection was carried down to the gall bladder/cystic duct junction and the cystic duct isolated.  A clip was placed on the gall bladder side of the cystic duct.   An intra-operative cholangiogram was shot.   The intra-operative cholangiogram was shot using a cut off Taut catheter placed through a 14 gauge angiocath in the RUQ.  The Taut catheter was inserted in the cut cystic duct and secured with an endoclip.  A cholangiogram was shot with 15 cc of 1/2 strength Omnipaque.  Using fluoroscopy, the cholangiogram showed the flow of contrast into the common bile duct, up the hepatic radicals, and into the duodenum.  The cystic duct was long, inserted in the distal common bile duct, but was otherwise okay. There was no mass or obstruction.  This was a normal intra-operative cholangiogram.   The Taut catheter was removed.  The cystic duct was tripley endoclipped and the cystic artery was identified and clipped.  The gall bladder was bluntly and sharpley dissected from the gall bladder bed.   After the gall bladder was removed from the liver, the gall bladder bed and Triangle of [place] were inspected.  There was no bleeding or bile leak.  The gall bladder was placed in a endocatch bag and delivered through the umbilicus.  The abdomen was irrigated with 30 cc saline.   The trocars were then removed.  I infiltrated 30 cc of 1/4% Marcaine into the  incisions.  The umbilical port closed with a 0 Vicryl suture and the skin closed with 5-0 vicryl.  The skin was painted with Dermabond.  The patient's sponge and needle count were correct.  The patient was transported to the RR in good condition.   Cholangiogram - normal.  Ovidio Kin, MD, [facility] Pager: [phone] Office phone:  [phone]

## 2012-12-07 NOTE — Transfer of Care (Signed)
Immediate Anesthesia Transfer of Care Note  Patient: Yvonne Davidson  Procedure(s) Performed: Procedure(s): LAPAROSCOPIC CHOLECYSTECTOMY WITH INTRAOPERATIVE CHOLANGIOGRAM (N/A)  Patient Location: PACU  Anesthesia Type:General  Level of Consciousness: awake, alert  and oriented  Airway & Oxygen Therapy: Patient connected to face mask oxygen  Post-op Assessment: Report given to PACU RN  Post vital signs: stable  Complications: No apparent anesthesia complications

## 2012-12-07 NOTE — Anesthesia Preprocedure Evaluation (Signed)
Anesthesia Evaluation  Patient identified by MRN, date of birth, ID band Patient awake    Reviewed: Allergy & Precautions, H&P , NPO status , Patient's Chart, lab work & pertinent test results  History of Anesthesia Complications (+) PONV  Airway Mallampati: II TM Distance: <3 FB Neck ROM: Full    Dental   Pulmonary asthma , sleep apnea , former smoker,    + decreased breath sounds      Cardiovascular hypertension, Rhythm:Regular Rate:Normal     Neuro/Psych Anxiety Depression    GI/Hepatic hiatal hernia, GERD-  ,  Endo/Other  diabetes  Renal/GU      Musculoskeletal   Abdominal   Peds  Hematology   Anesthesia Other Findings   Reproductive/Obstetrics                           Anesthesia Physical Anesthesia Plan  ASA: III  Anesthesia Plan: General   Post-op Pain Management:    Induction: Intravenous  Airway Management Planned: Oral ETT  Additional Equipment:   Intra-op Plan:   Post-operative Plan: Extubation in OR  Informed Consent: I have reviewed the patients History and Physical, chart, labs and discussed the procedure including the risks, benefits and alternatives for the proposed anesthesia with the patient or authorized representative who has indicated his/her understanding and acceptance.     Plan Discussed with: CRNA and Surgeon  Anesthesia Plan Comments:         Anesthesia Quick Evaluation

## 2012-12-07 NOTE — Preoperative (Signed)
Beta Blockers   Reason not to administer Beta Blockers:Not Applicable 

## 2012-12-07 NOTE — Anesthesia Postprocedure Evaluation (Signed)
  Anesthesia Post-op Note  Patient: Yvonne Davidson  Procedure(s) Performed: Procedure(s): LAPAROSCOPIC CHOLECYSTECTOMY WITH INTRAOPERATIVE CHOLANGIOGRAM (N/A)  Patient Location: PACU  Anesthesia Type:General  Level of Consciousness: awake  Airway and Oxygen Therapy: Patient Spontanous Breathing  Post-op Pain: mild  Post-op Assessment: Post-op Vital signs reviewed, Patient's Cardiovascular Status Stable, Respiratory Function Stable, Patent Airway, No signs of Nausea or vomiting and Pain level controlled  Post-op Vital Signs: Reviewed and stable  Complications: No apparent anesthesia complications

## 2012-12-07 NOTE — Anesthesia Procedure Notes (Signed)
Procedure Name: Intubation Date/Time: 12/07/2012 7:39 AM Performed by: Ellin Goodie Pre-anesthesia Checklist: Patient identified, Emergency Drugs available, Suction available, Patient being monitored and Timeout performed Patient Re-evaluated:Patient Re-evaluated prior to inductionOxygen Delivery Method: Circle system utilized Preoxygenation: Pre-oxygenation with 100% oxygen Intubation Type: IV induction Ventilation: Mask ventilation without difficulty Laryngoscope Size: Mac and 3 Grade View: Grade II Tube type: Oral Tube size: 7.5 mm Number of attempts: 1 Airway Equipment and Method: Stylet Placement Confirmation: ETT inserted through vocal cords under direct vision,  positive ETCO2 and breath sounds checked- equal and bilateral Secured at: 21 cm Tube secured with: Tape Dental Injury: Teeth and Oropharynx as per pre-operative assessment  Comments: Easy atraumatic induction, mask, and intubation with MAC 3 blade.  Dr. Gypsy Balsam verified placement of ETT.  Carlynn Herald, CRNA

## 2012-12-08 NOTE — Discharge Summary (Signed)
Physician Discharge Summary  Patient ID:  Yvonne Davidson  MRN: [id number]  DOB/AGE: 1941/01/02 72 y.o.  Admit date: 12/07/2012 Discharge date: 12/08/2012  Discharge Diagnoses:  1.  Chronic cholecystitis, cholelithiasis. 2. Hashimoto's thyroiditis - now with normal thyroid function  3. Hypertension  4. OSA   Has seen Dr. Joellyn Rued at [facility]  5. GERD  6. Hyperlipidemia  7. Multiple allergies.  8. Gastritis   Seen by Dr. Maggie Font [facility])  Operation: Procedure(s): LAPAROSCOPIC CHOLECYSTECTOMY WITH INTRAOPERATIVE CHOLANGIOGRAM on 12/07/2012  Discharged Condition: good  Hospital Course: Yvonne Davidson is an 72 y.o. female whose primary care physician is Wynona Dove, MD and who was admitted 12/07/2012 with a chief complaint of symptomatic gall bladder disease.   She was brought to the operating room on 12/07/2012 and underwent  LAPAROSCOPIC CHOLECYSTECTOMY WITH INTRAOPERATIVE CHOLANGIOGRAM  .   She is now one day post op.  She has done very well with surgery.  She is ready to go home.  Her female friend, Mr. Yvonne Davidson, is with her. The discharge instructions were reviewed with the patient.  Consults: None  Significant Diagnostic Studies: Results for orders placed during the hospital encounter of 12/07/12  GLUCOSE, CAPILLARY      Result Value Range   Glucose-Capillary 116 (*) 70 - 99 mg/dL  GLUCOSE, CAPILLARY      Result Value Range   Glucose-Capillary 127 (*) 70 - 99 mg/dL   Dg Cholangiogram Operative  12/07/2012   CLINICAL DATA:  Cholelithiasis  EXAM: INTRAOPERATIVE CHOLANGIOGRAM  TECHNIQUE: Cholangiographic images from the C-arm fluoroscopic device were submitted for interpretation post-operatively. Please see the procedural report for the amount of contrast and the fluoroscopy time utilized.  COMPARISON:  None.  FINDINGS: 38 seconds of fluoroscopy was utilized. Injection in the cystic duct remnant reveals a long cystic duct with a low insertion within the  common bile duct. Free flow of contrast material into the duodenum is seen. No definitive filling defects are noted.   Electronically Signed   By: Alcide Clever M.D.   On: 12/07/2012 08:44    Discharge Exam:  Filed Vitals:   12/08/12 0558  BP: 126/43  Pulse: 58  Temp: 97.9 F (36.6 C)  Resp: 20    General: WN older WF who is alert and generally healthy appearing.  Lungs: Clear to auscultation and symmetric breath sounds. Heart:  RRR. No murmur or rub. Abdomen: Soft. No mass. Incisions look good.  Discharge Medications:     Medication List         carboxymethylcellulose 0.5 % Soln  Commonly known as:  REFRESH PLUS  Place 1 drop into both eyes daily as needed (for dry eyes).     cloNIDine 0.1 MG tablet  Commonly known as:  CATAPRES  Take 0.05-0.1 mg by mouth 2 (two) times daily. Takes  tablet at bedtime, then rises 2 hours later and takes a whole tablet.     losartan 100 MG tablet  Commonly known as:  COZAAR  Take 100 mg by mouth every morning.     ONE FLOW SPIROMETER Devi  Please provide incentive spirometer DX: Asthma     Pancrelipase (Lip-Prot-Amyl) 24000 UNITS Cpep  Commonly known as:  CREON  Take 1 capsule (24,000 Units total) by mouth daily.     PRESCRIPTION MEDICATION  Place 1 spray into the nose daily. Compounded medication sodium chloride/mupirocin     sodium chloride 0.65 % Soln nasal spray  Commonly known as:  OCEAN  Place 1  spray into both nostrils daily as needed for congestion.     traZODone 25 mg Tabs tablet  Commonly known as:  DESYREL  Take 0.5 tablets (25 mg total) by mouth at bedtime.        Disposition: 01-Home or Self Care      Discharge Orders   Future Appointments Provider Department Dept Phone   12/27/2012 12:15 PM Kandis Cocking, MD [facility], [place] [phone]   Future Orders Complete By Expires   Diet - low sodium heart healthy  As directed    Increase activity slowly  As directed      Return to work on:   12/22/2012  Activity:  Driving - May drive in 2 or 3 days, if doing well.   Lifting - No lifting > 15 pounds for one week, then no limit  Wound Care:   May shower starting tomorrow.  Diet:  Low fat for 2 weeks, then no limit.  Follow up appointment:  Call Dr. Allene Pyo office [facility]) at [phone] for an appointment in 2 to 3 weeks.  Medications and dosages:  Resume your home medications.  You have a prescription for:  Ultram.  Signed: Ovidio Kin, M.D., [facility] Office:  [phone]  12/08/2012, 10:44 AM

## 2012-12-08 NOTE — Progress Notes (Signed)
Discharge home. Home discharge instruction given, no question verbalized. 

## 2012-12-11 ENCOUNTER — Encounter (HOSPITAL_COMMUNITY): Payer: Self-pay | Admitting: Surgery

## 2012-12-12 ENCOUNTER — Encounter (HOSPITAL_COMMUNITY): Payer: Self-pay | Admitting: Emergency Medicine

## 2012-12-12 ENCOUNTER — Emergency Department (HOSPITAL_COMMUNITY): Payer: Medicare Other

## 2012-12-12 ENCOUNTER — Emergency Department (HOSPITAL_COMMUNITY)
Admission: EM | Admit: 2012-12-12 | Discharge: 2012-12-12 | Disposition: A | Discharge status: Home / Self Care | Payer: Medicare Other | Attending: Emergency Medicine | Admitting: Emergency Medicine

## 2012-12-12 ENCOUNTER — Telehealth (INDEPENDENT_AMBULATORY_CARE_PROVIDER_SITE_OTHER): Payer: Self-pay | Admitting: General Surgery

## 2012-12-12 DIAGNOSIS — Z8669 Personal history of other diseases of the nervous system and sense organs: Secondary | ICD-10-CM | POA: Insufficient documentation

## 2012-12-12 DIAGNOSIS — Z8709 Personal history of other diseases of the respiratory system: Secondary | ICD-10-CM | POA: Insufficient documentation

## 2012-12-12 DIAGNOSIS — G4700 Insomnia, unspecified: Secondary | ICD-10-CM | POA: Insufficient documentation

## 2012-12-12 DIAGNOSIS — I10 Essential (primary) hypertension: Secondary | ICD-10-CM | POA: Insufficient documentation

## 2012-12-12 DIAGNOSIS — F3289 Other specified depressive episodes: Secondary | ICD-10-CM | POA: Insufficient documentation

## 2012-12-12 DIAGNOSIS — R141 Gas pain: Secondary | ICD-10-CM | POA: Insufficient documentation

## 2012-12-12 DIAGNOSIS — Z862 Personal history of diseases of the blood and blood-forming organs and certain disorders involving the immune mechanism: Secondary | ICD-10-CM | POA: Insufficient documentation

## 2012-12-12 DIAGNOSIS — E119 Type 2 diabetes mellitus without complications: Secondary | ICD-10-CM | POA: Insufficient documentation

## 2012-12-12 DIAGNOSIS — Z8739 Personal history of other diseases of the musculoskeletal system and connective tissue: Secondary | ICD-10-CM | POA: Insufficient documentation

## 2012-12-12 DIAGNOSIS — R109 Unspecified abdominal pain: Secondary | ICD-10-CM

## 2012-12-12 DIAGNOSIS — K219 Gastro-esophageal reflux disease without esophagitis: Secondary | ICD-10-CM | POA: Insufficient documentation

## 2012-12-12 DIAGNOSIS — Z9089 Acquired absence of other organs: Secondary | ICD-10-CM | POA: Insufficient documentation

## 2012-12-12 DIAGNOSIS — R011 Cardiac murmur, unspecified: Secondary | ICD-10-CM | POA: Insufficient documentation

## 2012-12-12 DIAGNOSIS — Z91040 Latex allergy status: Secondary | ICD-10-CM | POA: Insufficient documentation

## 2012-12-12 DIAGNOSIS — F411 Generalized anxiety disorder: Secondary | ICD-10-CM | POA: Insufficient documentation

## 2012-12-12 DIAGNOSIS — Z79899 Other long term (current) drug therapy: Secondary | ICD-10-CM | POA: Insufficient documentation

## 2012-12-12 DIAGNOSIS — R142 Eructation: Secondary | ICD-10-CM | POA: Insufficient documentation

## 2012-12-12 DIAGNOSIS — F329 Major depressive disorder, single episode, unspecified: Secondary | ICD-10-CM | POA: Insufficient documentation

## 2012-12-12 DIAGNOSIS — Z87891 Personal history of nicotine dependence: Secondary | ICD-10-CM | POA: Insufficient documentation

## 2012-12-12 DIAGNOSIS — R1011 Right upper quadrant pain: Secondary | ICD-10-CM | POA: Insufficient documentation

## 2012-12-12 LAB — COMPREHENSIVE METABOLIC PANEL
ALT: 38 U/L — ABNORMAL HIGH (ref 0–35)
AST: 28 U/L (ref 0–37)
Alkaline Phosphatase: 76 U/L (ref 39–117)
CO2: 24 mEq/L (ref 19–32)
Calcium: 8.9 mg/dL (ref 8.4–10.5)
Chloride: 106 mEq/L (ref 96–112)
GFR calc Af Amer: >90 mL/min (ref >90)
GFR calc non Af Amer: 85 mL/min — ABNORMAL LOW (ref >90)
Glucose, Bld: 136 mg/dL — ABNORMAL HIGH (ref 70–99)
Potassium: 3.6 mEq/L (ref 3.5–5.1)
Sodium: 142 mEq/L (ref 135–145)
Total Bilirubin: 0.3 mg/dL (ref 0.3–1.2)

## 2012-12-12 LAB — CBC WITH DIFFERENTIAL/PLATELET
Basophils Absolute: 0 10*3/uL (ref 0.0–0.1)
Eosinophils Absolute: 0.3 10*3/uL (ref 0.0–0.7)
Eosinophils Relative: 4 % (ref 0–5)
Hemoglobin: 12.8 g/dL (ref 12.0–15.0)
Lymphocytes Relative: 14 % (ref 12–46)
Lymphs Abs: 1.2 10*3/uL (ref 0.7–4.0)
MCH: 30.7 pg (ref 26.0–34.0)
MCV: 89.9 fL (ref 78.0–100.0)
Neutro Abs: 6.1 10*3/uL (ref 1.7–7.7)
Platelets: 253 10*3/uL (ref 150–400)
RBC: 4.17 MIL/uL (ref 3.87–5.11)
RDW: 14.3 % (ref 11.5–15.5)
WBC: 8.7 10*3/uL (ref 4.0–10.5)

## 2012-12-12 LAB — URINALYSIS, ROUTINE W REFLEX MICROSCOPIC
Bilirubin Urine: NEGATIVE
Ketones, ur: NEGATIVE mg/dL
Protein, ur: NEGATIVE mg/dL
Urobilinogen, UA: 0.2 mg/dL (ref 0.0–1.0)

## 2012-12-12 LAB — URINE MICROSCOPIC-ADD ON

## 2012-12-12 MED ORDER — HYDROMORPHONE HCL PF 1 MG/ML IJ SOLN
1.0000 mg | Freq: Once | INTRAMUSCULAR | Status: DC
  Filled 2012-12-12: qty 1

## 2012-12-12 MED ORDER — SODIUM CHLORIDE 0.9 % IV BOLUS (SEPSIS)
1000.0000 mL | Freq: Once | INTRAVENOUS | Status: AC
  Administered 2012-12-12: 1000 mL via INTRAVENOUS

## 2012-12-12 MED ORDER — IOHEXOL 300 MG/ML  SOLN: 80.0000 mL | Freq: Once | INTRAMUSCULAR | Status: DC | PRN

## 2012-12-12 MED ORDER — ONDANSETRON HCL 4 MG/2ML IJ SOLN: 4.0000 mg | Freq: Once | INTRAMUSCULAR | Status: DC

## 2012-12-12 MED ORDER — IOHEXOL 300 MG/ML  SOLN
25.0000 mL | INTRAMUSCULAR | Status: AC
  Administered 2012-12-12: 25 mL via ORAL

## 2012-12-12 MED ORDER — MORPHINE SULFATE 4 MG/ML IJ SOLN: 4.0000 mg | Freq: Once | INTRAMUSCULAR | Status: DC

## 2012-12-12 NOTE — ED Provider Notes (Signed)
CSN: [id number]     Arrival date & time 12/12/12  0501 History   First MD Initiated Contact with Patient 12/12/12 0507     Chief Complaint  Patient presents with  . Abdominal Pain   (Consider location/radiation/quality/duration/timing/severity/associated sxs/prior Treatment) HPI Comments: Patient is a 72 year old female with past medical history of hypertension and diabetes. She recently underwent laparoscopic cholecystectomy by Dr. Ezzard Standing. This was done 5 days ago. She has been recovering well until this evening. She states that she rolled a portable heater across the floor and then developed a sudden onset of right upper quadrant pain. She reports her pain is severe she felt nauseated. She denies shortness of breath, chest pain, or diaphoresis.  Patient is a 72 y.o. female presenting with abdominal pain. The history is provided by the patient.  Abdominal Pain Pain location:  RUQ Pain quality: cramping and sharp   Pain radiates to:  Does not radiate Pain severity:  Severe Onset quality:  Sudden Duration:  1 hour Timing:  Constant Progression:  Unchanged Chronicity:  New Relieved by:  Nothing Worsened by:  Nothing tried   Past Medical History  Diagnosis Date  . Chronic sinusitis     takes Cetirizine daily  . Hashimoto's thyroiditis   . Allergy   . Chronic bronchitis     last time 2 yrs ago  . Arthritis   . Heart murmur   . Hyperlipidemia     doesn't take any meds for this  . Osteoporosis     takes Vit D as needed  . PONV (postoperative nausea and vomiting)     hard to wake up  . Hypertension     takes Losartan and Clonidine nightly  . OSA (obstructive sleep apnea)     doesn't use a cpap  . Gastritis   . H/O hiatal hernia   . Diverticulosis   . Urinary frequency   . Anemia   . History of blood transfusion     in the 60's no abnormal reaction noted  . Insomnia     takes Trazodone nightly  . Diabetes mellitus without complication     borderline  . Bilateral  cataracts     immature  . Dry eyes   . Anxiety   . Depression     doesn't take any meds  . GERD (gastroesophageal reflux disease)     TAKES OMEPRAZOLE DAILY   Past Surgical History  Procedure Laterality Date  . Nasal sinus surgery  2006    x 2  . Right arm surgery  1960  . Tcs    . Liposuction    . Cholecystectomy  12/07/2012    Dr Ezzard Standing  . Cholecystectomy N/A 12/07/2012    Procedure: LAPAROSCOPIC CHOLECYSTECTOMY WITH INTRAOPERATIVE CHOLANGIOGRAM;  Surgeon: Kandis Cocking, MD;  Location: [facility] OR;  Service: General;  Laterality: N/A;   Family History  Problem Relation Age of Onset  . Arthritis Mother   . Heart disease Mother   . Hyperlipidemia Mother   . Hypertension Mother   . Stroke Mother   . Cancer Mother     Breast & Uterine - 20'-30's  . Early death Father     Fire  . Alcohol abuse Father    History  Substance Use Topics  . Smoking status: Former Smoker -- 0.50 packs/day for 18 years    Types: Cigarettes  . Smokeless tobacco: Never Used     Comment: quit in 1978  . Alcohol Use: Yes  Comment: occassionally beer or wine   OB History   Grav Para Term Preterm Abortions TAB SAB Ect Mult Living                 Review of Systems  Gastrointestinal: Positive for abdominal pain.  All other systems reviewed and are negative.    Allergies  Oxycodone hcl; Sumycin; Codeine; Esomeprazole magnesium; Hydrochlorothiazide; Indomethacin; Keflex; Latex; Mold extract; Neomycin-bacitracin zn-polymyx; Procaine hcl; Pseudoephedrine hcl er; Shrimp; Telithromycin; and Tetracycline  Home Medications   Current Outpatient Rx  Name  Route  Sig  Dispense  Refill  . carboxymethylcellulose (REFRESH PLUS) 0.5 % SOLN   Both Eyes   Place 1 drop into both eyes daily as needed (for dry eyes).          . cloNIDine (CATAPRES) 0.1 MG tablet   Oral   Take 0.05-0.1 mg by mouth 2 (two) times daily. Takes  tablet at bedtime, then rises 2 hours later and takes a whole tablet.          Marland Kitchen losartan (COZAAR) 100 MG tablet   Oral   Take 100 mg by mouth every morning.         . Pancrelipase, Lip-Prot-Amyl, (CREON) 24000 UNITS CPEP   Oral   Take 1 capsule (24,000 Units total) by mouth daily.   180 capsule   3   . PRESCRIPTION MEDICATION   Nasal   Place 1 spray into the nose daily. Compounded medication sodium chloride/mupirocin         . Respiratory Therapy Supplies (ONE FLOW SPIROMETER) DEVI      Please provide incentive spirometer DX: Asthma   1 each   0   . sodium chloride (OCEAN) 0.65 % SOLN nasal spray   Each Nare   Place 1 spray into both nostrils daily as needed for congestion.         . traZODone (DESYREL) 25 mg TABS tablet   Oral   Take 0.5 tablets (25 mg total) by mouth at bedtime.   30 tablet   5    BP 172/65  Pulse 65  Temp(Src) 97.5 F (36.4 C) (Oral)  Resp 22  SpO2 99% Physical Exam  Nursing note and vitals reviewed. Constitutional: She is oriented to person, place, and time. She appears well-developed and well-nourished. No distress.  HENT:  Head: Normocephalic and atraumatic.  Neck: Normal range of motion. Neck supple.  Cardiovascular: Normal rate and regular rhythm.  Exam reveals no gallop and no friction rub.   No murmur heard. Pulmonary/Chest: Effort normal and breath sounds normal. No respiratory distress. She has no wheezes.  Abdominal: Soft. Bowel sounds are normal. She exhibits distension. There is no tenderness.  There is tenderness to palpation in the right upper quadrant without rebound or guarding. Bowel sounds are present areas there are no masses. The incision sites are all clean without drainage or erythema.  Musculoskeletal: Normal range of motion.  Neurological: She is alert and oriented to person, place, and time.  Skin: Skin is warm and dry. She is not diaphoretic.    ED Course  Procedures (including critical care time) Labs Review Labs Reviewed  CBC WITH DIFFERENTIAL  COMPREHENSIVE METABOLIC PANEL   LIPASE, BLOOD  URINALYSIS, ROUTINE W REFLEX MICROSCOPIC   Imaging Review No results found.  EKG Interpretation   None       MDM  No diagnosis found. Patient is a 72 year old female presents with right upper quadrant pain that started one hour prior to arrival  while pushing a portable heater across the floor. She is status post laparoscopic cholecystectomy by Dr. Ezzard Standing 5 days ago. Her laboratory studies at this point are unremarkable. There is no leukocytosis and LFTs are all unremarkable. She was given a lot of with substantial improvement in her pain. She will undergo CT scan of the abdomen and pelvis to rule out any complications from the surgery. Care will be signed out to the oncoming physician at shift change pending results of the CT scan.    Geoffery Lyons, MD 12/12/12 [phone]

## 2012-12-12 NOTE — Telephone Encounter (Signed)
F/U call ; V/M to call  Ask for [facility]

## 2012-12-12 NOTE — ED Notes (Signed)
Pt "up with bronchitis" and approx 1 hour PTA pt had sudden pain when pushing heater across floor on wheels. Pain was 10/10. Pt had gall bladder by Dr Thalia Bloodgood 1 week ago.

## 2012-12-12 NOTE — Telephone Encounter (Signed)
Left message for pt to call back and ask for Nursing office to assess current pain and need for urgent appt with Dr. Ezzard Standing prior to appt on 12/27/12.

## 2012-12-12 NOTE — ED Notes (Signed)
Pt denies any pain at this time and does not want to take any pain medication at this time

## 2012-12-12 NOTE — Telephone Encounter (Signed)
Pt called in follow-up from spending the night in ED for excruciating pain; she is about 5 days post op lap chole.  The ED did a CT, which was negative for abscess or fluid retention.  Pt was treated for pain and sent home.  She is still in pain and describes it as like being in labor.  Please advise.

## 2012-12-12 NOTE — ED Provider Notes (Addendum)
Patient turned over to me pending CT scan of the abdomen. Patient status post laparoscopic cholecystectomy one week ago. Patient developed significant right upper quadrant pain CT scan is negative labs were negative patient's improved. Discharge patient home and followup with general surgery as needed.  Results for orders placed during the hospital encounter of 12/12/12  CBC WITH DIFFERENTIAL      Result Value Range   WBC 8.7  4.0 - 10.5 K/uL   RBC 4.17  3.87 - 5.11 MIL/uL   Hemoglobin 12.8  12.0 - 15.0 g/dL   HCT 65.7  84.6 - 96.2 %   MCV 89.9  78.0 - 100.0 fL   MCH 30.7  26.0 - 34.0 pg   MCHC 34.1  30.0 - 36.0 g/dL   RDW 95.2  84.1 - 32.4 %   Platelets 253  150 - 400 K/uL   Neutrophils Relative % 70  43 - 77 %   Neutro Abs 6.1  1.7 - 7.7 K/uL   Lymphocytes Relative 14  12 - 46 %   Lymphs Abs 1.2  0.7 - 4.0 K/uL   Monocytes Relative 12  3 - 12 %   Monocytes Absolute 1.0  0.1 - 1.0 K/uL   Eosinophils Relative 4  0 - 5 %   Eosinophils Absolute 0.3  0.0 - 0.7 K/uL   Basophils Relative 0  0 - 1 %   Basophils Absolute 0.0  0.0 - 0.1 K/uL  COMPREHENSIVE METABOLIC PANEL      Result Value Range   Sodium 142  135 - 145 mEq/L   Potassium 3.6  3.5 - 5.1 mEq/L   Chloride 106  96 - 112 mEq/L   CO2 24  19 - 32 mEq/L   Glucose, Bld 136 (*) 70 - 99 mg/dL   BUN 13  6 - 23 mg/dL   Creatinine, Ser 4.01  0.50 - 1.10 mg/dL   Calcium 8.9  8.4 - 02.7 mg/dL   Total Protein 6.8  6.0 - 8.3 g/dL   Albumin 3.7  3.5 - 5.2 g/dL   AST 28  0 - 37 U/L   ALT 38 (*) 0 - 35 U/L   Alkaline Phosphatase 76  39 - 117 U/L   Total Bilirubin 0.3  0.3 - 1.2 mg/dL   GFR calc non Af Amer 85 (*) >90 mL/min   GFR calc Af Amer >90  >90 mL/min  LIPASE, BLOOD      Result Value Range   Lipase 31  11 - 59 U/L  URINALYSIS, ROUTINE W REFLEX MICROSCOPIC      Result Value Range   Color, Urine YELLOW  YELLOW   APPearance CLEAR  CLEAR   Specific Gravity, Urine 1.011  1.005 - 1.030   pH 6.5  5.0 - 8.0   Glucose, UA NEGATIVE   NEGATIVE mg/dL   Hgb urine dipstick SMALL (*) NEGATIVE   Bilirubin Urine NEGATIVE  NEGATIVE   Ketones, ur NEGATIVE  NEGATIVE mg/dL   Protein, ur NEGATIVE  NEGATIVE mg/dL   Urobilinogen, UA 0.2  0.0 - 1.0 mg/dL   Nitrite NEGATIVE  NEGATIVE   Leukocytes, UA NEGATIVE  NEGATIVE  URINE MICROSCOPIC-ADD ON      Result Value Range   Squamous Epithelial / LPF RARE  RARE   WBC, UA 0-2  <3 WBC/hpf   RBC / HPF 3-6  <3 RBC/hpf   Ct Abdomen Pelvis Wo Contrast  12/12/2012   CLINICAL DATA:  Recent laparoscopic cholecystectomy done 5 days  ago. Sudden onset of right upper quadrant pain.  EXAM: CT ABDOMEN AND PELVIS WITHOUT CONTRAST  TECHNIQUE: Multidetector CT imaging of the abdomen and pelvis was performed following the standard protocol without intravenous contrast.  COMPARISON:  None.  FINDINGS: There is left basilar atelectasis.  No renal, ureteral, or bladder calculi. No obstructive uropathy. No perinephric stranding is seen. The kidneys are symmetric in size without evidence for exophytic mass. The bladder is unremarkable.  The liver demonstrates no focal abnormality. The gallbladder is surgically absent with mild stranding in the gallbladder fossa consistent with postsurgical change. There is no focal fluid collection to suggest a biloma or abscess. There is mild fat stranding within the subcutaneous fat of the anterior abdominal wall consistent with recent surgery. . The spleen demonstrates no focal abnormality. The adrenal glands and pancreas are normal.  The stomach, duodenum, small intestine and large intestine are unremarkable. There is a normal caliber appendix in the right lower quadrant without periappendiceal inflammatory changes. There is no pneumoperitoneum, pneumatosis, or portal venous gas. There is no abdominal or pelvic free fluid. There is no lymphadenopathy.  The abdominal aorta is normal in caliber with atherosclerosis.  There is lumbar spine spondylosis. There is no lytic or blastic osseous  lesion.  IMPRESSION: 1. Postsurgical changes in the gallbladder fossa from recent cholecystectomy. No focal fluid collection to suggest a biloma or abscess. Postsurgical changes noted in the anterior abdominal wall.   Electronically Signed   By: Elige Ko   On: 12/12/2012 08:22   Dg Chest 2 View  11/29/2012   CLINICAL DATA:  Preoperative evaluation, history of hypertension and diabetes  EXAM: CHEST  2 VIEW  COMPARISON:  04/29/2011  FINDINGS: The cardiac shadow is stable. The lungs are clear bilaterally. No focal infiltrate or sizable effusion is noted. No acute bony abnormality is noted.  IMPRESSION: No acute abnormality seen.   Electronically Signed   By: Alcide Clever M.D.   On: 11/29/2012 17:12   Dg Cholangiogram Operative  12/07/2012   CLINICAL DATA:  Cholelithiasis  EXAM: INTRAOPERATIVE CHOLANGIOGRAM  TECHNIQUE: Cholangiographic images from the C-arm fluoroscopic device were submitted for interpretation post-operatively. Please see the procedural report for the amount of contrast and the fluoroscopy time utilized.  COMPARISON:  None.  FINDINGS: 38 seconds of fluoroscopy was utilized. Injection in the cystic duct remnant reveals a long cystic duct with a low insertion within the common bile duct. Free flow of contrast material into the duodenum is seen. No definitive filling defects are noted.   Electronically Signed   By: Alcide Clever M.D.   On: 12/07/2012 08:44        Shelda Jakes, MD 12/12/12 1610  Shelda Jakes, MD 12/12/12 [phone]

## 2012-12-12 NOTE — ED Notes (Signed)
D/c I/v 

## 2012-12-12 NOTE — ED Notes (Signed)
Pt discharged home with all belongings, alert and ambulatory upon discharge, mo mew RX prescribed, pt verbalizes understanding of discharge instructions, pt driven home by spouse

## 2012-12-26 ENCOUNTER — Encounter (INDEPENDENT_AMBULATORY_CARE_PROVIDER_SITE_OTHER): Payer: Self-pay

## 2012-12-27 ENCOUNTER — Encounter (INDEPENDENT_AMBULATORY_CARE_PROVIDER_SITE_OTHER): Payer: Medicare Other | Admitting: Surgery

## 2013-01-01 ENCOUNTER — Telehealth (INDEPENDENT_AMBULATORY_CARE_PROVIDER_SITE_OTHER): Payer: Self-pay

## 2013-01-01 NOTE — Telephone Encounter (Signed)
F/U call ask how patient is doing and to schedule appointment with Dr. Ezzard Standing

## 2013-01-02 ENCOUNTER — Encounter: Payer: Self-pay | Admitting: Internal Medicine

## 2013-01-02 ENCOUNTER — Ambulatory Visit (INDEPENDENT_AMBULATORY_CARE_PROVIDER_SITE_OTHER): Payer: Medicare Other | Admitting: Internal Medicine

## 2013-01-02 VITALS — BP 158/70 | HR 70 | Temp 97.9°F | Resp 14 | Ht 63.0 in | Wt 140.5 lb

## 2013-01-02 DIAGNOSIS — R0781 Pleurodynia: Secondary | ICD-10-CM | POA: Insufficient documentation

## 2013-01-02 DIAGNOSIS — R079 Chest pain, unspecified: Secondary | ICD-10-CM

## 2013-01-02 NOTE — Progress Notes (Signed)
Pre-visit discussion using our clinic review tool. No additional management support is needed unless otherwise documented below in the visit note.  
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
## 2013-01-02 NOTE — Patient Instructions (Signed)
Start Ibuprofen 600mg  up to three times daily to help with pain and inflammation. Please call if no improvement in symptoms.

## 2013-01-08 ENCOUNTER — Encounter (INDEPENDENT_AMBULATORY_CARE_PROVIDER_SITE_OTHER): Payer: Self-pay | Admitting: Surgery

## 2013-01-10 NOTE — Telephone Encounter (Signed)
Patient states she started a new job so she was not able to make appointment 12-27-12 appointment scheduled 01/01/13 5:15 with Dr. Ezzard Standing . Patient states she is doing good at this time

## 2013-01-26 ENCOUNTER — Encounter (INDEPENDENT_AMBULATORY_CARE_PROVIDER_SITE_OTHER): Payer: Self-pay | Admitting: *Deleted

## 2013-02-01 ENCOUNTER — Ambulatory Visit (INDEPENDENT_AMBULATORY_CARE_PROVIDER_SITE_OTHER): Payer: Medicare PPO | Admitting: Surgery

## 2013-02-01 ENCOUNTER — Encounter (INDEPENDENT_AMBULATORY_CARE_PROVIDER_SITE_OTHER): Payer: Self-pay | Admitting: Surgery

## 2013-02-01 VITALS — BP 136/82 | HR 82 | Temp 98.0°F | Resp 18 | Ht 63.0 in | Wt 143.0 lb

## 2013-02-01 DIAGNOSIS — K8020 Calculus of gallbladder without cholecystitis without obstruction: Secondary | ICD-10-CM

## 2013-02-01 NOTE — Progress Notes (Signed)
[place], MD,  [address]  [place], [place]    [place] Phone:  [phone] FAX:  [phone]   Re:   Yvonne Davidson DOB:   28-Apr-1940 MRN:   [id number]  ASSESSMENT AND PLAN: 1. Lap  Cholecystectomy - D. Porschia Willbanks - 12/07/2012  Doing well, path to patient.  Return PRN. 2. Hashimoto's thyroiditis - now with normal thyroid function  3. Hypertension  4. OSA   Has seen Dr. Darrow Bussing at [facility]  5. GERD  6. Hyperlipidemia  7. Multiple allergies.  8. Gastritis   Seen by Dr. Kennis Carina [facility])  HISTORY OF PRESENT ILLNESS: Chief Complaint  Patient presents with  . Routine Post Op    Lap. chole    Yvonne Davidson is a 73 y.o. (DOB: Apr 23, 1940)  white  female who is a patient of Rica Mast, MD and comes to me today for follow up of a lap chole. She got a ticket on [employer] for speeding. She has some soreness at her incisions, but this is getting better.  Past Medical History  Diagnosis Date  . Chronic sinusitis     takes Cetirizine daily  . Hashimoto's thyroiditis   . Allergy   . Chronic bronchitis     last time 2 yrs ago  . Arthritis   . Heart murmur   . Hyperlipidemia     doesn't take any meds for this  . Osteoporosis     takes Vit D as needed  . PONV (postoperative nausea and vomiting)     hard to wake up  . Hypertension     takes Losartan and Clonidine nightly  . OSA (obstructive sleep apnea)     doesn't use a cpap  . Gastritis   . H/O hiatal hernia   . Diverticulosis   . Urinary frequency   . Anemia   . History of blood transfusion     in the 60's no abnormal reaction noted  . Insomnia     takes Trazodone nightly  . Diabetes mellitus without complication     borderline  . Bilateral cataracts     immature  . Dry eyes   . Anxiety   . Depression     doesn't take any meds  . GERD (gastroesophageal reflux disease)     TAKES OMEPRAZOLE DAILY    SOCIAL  HISTORY:   PHYSICAL EXAM: BP 136/82  Pulse 82  Temp(Src) 98 F (36.7 C)  Resp 18  Ht 5\' 3"  (1.6 m)  Wt 143 lb (64.864 kg)  BMI 25.34 kg/m2  Abdomen:  Wounds look good.  DATA REVIEWED: Path report to patient.   Alphonsa Overall, MD,  [facility], [address]  [place], [place]    [place] Phone:  [phone] FAX:  [phone]

## 2013-02-28 ENCOUNTER — Telehealth: Payer: Self-pay | Admitting: Internal Medicine

## 2013-02-28 DIAGNOSIS — I10 Essential (primary) hypertension: Secondary | ICD-10-CM

## 2013-02-28 NOTE — Telephone Encounter (Signed)
Pt left message 02/26/13 needing several referral she has [employer]

## 2013-02-28 NOTE — Telephone Encounter (Signed)
Patient aware of the process.

## 2013-03-05 NOTE — Telephone Encounter (Signed)
Patient would like to get a referral for a new cardiologist Dr. Ubaldo Glassing at [facility] in the past. She states that her blood pressure has been out of wack and she states Dr. Ubaldo Glassing hasn't been able to help and she doesn't trust him either. I told her that you would more than likely set her up with [facility], she was fine with who you chose for her.

## 2013-03-06 ENCOUNTER — Telehealth: Payer: Self-pay | Admitting: Emergency Medicine

## 2013-03-06 NOTE — Telephone Encounter (Signed)
Patient calling in wanting referral to an allergist, ? Maybe its contributing to her sleep apena. Please advise.

## 2013-03-06 NOTE — Telephone Encounter (Signed)
We should see her in a visit so that I can document reason for referral.

## 2013-03-06 NOTE — Telephone Encounter (Signed)
Fwd to Dr. Walker 

## 2013-03-07 NOTE — Telephone Encounter (Signed)
Left message to call back  

## 2013-03-12 NOTE — Telephone Encounter (Signed)
Left detailed message on patient voicemail to call office back and schedule an appointment.

## 2013-03-14 ENCOUNTER — Ambulatory Visit: Payer: Medicare Other | Admitting: Cardiovascular Disease

## 2013-03-14 ENCOUNTER — Ambulatory Visit (INDEPENDENT_AMBULATORY_CARE_PROVIDER_SITE_OTHER): Payer: Medicare PPO | Admitting: Cardiovascular Disease

## 2013-03-14 ENCOUNTER — Encounter: Payer: Self-pay | Admitting: Cardiovascular Disease

## 2013-03-14 VITALS — BP 142/68 | HR 72 | Ht 62.5 in | Wt 145.5 lb

## 2013-03-14 DIAGNOSIS — F411 Generalized anxiety disorder: Secondary | ICD-10-CM

## 2013-03-14 DIAGNOSIS — F419 Anxiety disorder, unspecified: Secondary | ICD-10-CM

## 2013-03-14 DIAGNOSIS — E785 Hyperlipidemia, unspecified: Secondary | ICD-10-CM

## 2013-03-14 DIAGNOSIS — G4730 Sleep apnea, unspecified: Secondary | ICD-10-CM

## 2013-03-14 DIAGNOSIS — I10 Essential (primary) hypertension: Secondary | ICD-10-CM

## 2013-03-14 MED ORDER — AMLODIPINE BESYLATE 10 MG PO TABS: 10.0000 mg | ORAL_TABLET | Freq: Every day | ORAL | Status: DC

## 2013-03-14 MED ORDER — LISINOPRIL 40 MG PO TABS: 40.0000 mg | ORAL_TABLET | Freq: Every day | ORAL | Status: DC

## 2013-03-14 MED ORDER — LOSARTAN POTASSIUM 100 MG PO TABS: 100.0000 mg | ORAL_TABLET | Freq: Every morning | ORAL | Status: DC

## 2013-03-14 MED ORDER — CLONIDINE HCL 0.1 MG PO TABS: 0.1000 mg | ORAL_TABLET | Freq: Three times a day (TID) | ORAL | Status: DC

## 2013-03-14 NOTE — Assessment & Plan Note (Signed)
We'll try to provide reassurance with her blood pressure. She may benefit from a regular walking program if she is able to do this. She does have leg pain, chronic fatigue

## 2013-03-14 NOTE — Assessment & Plan Note (Signed)
Would not recommend a statin at this time given her chronic back pain. Would recommend she start a regular exercise program. Discussed this with her in followup once blood pressure well controlled

## 2013-03-14 NOTE — Assessment & Plan Note (Signed)
She attributes to much of the way she feels to her poorly controlled sleep apnea. She has followup at [facility] at the beginning of March with repeat CPAP titration trial including her dental mouth piece

## 2013-03-14 NOTE — Telephone Encounter (Signed)
Due to no openings on your schedule, patient was scheduled with Raquel on 2/20. Patient did not want to wait, insisted being seen sooner due to dealing with sinus issue for so long. Told Lorriane Shire to put her on Raquel schedule, is that ok?

## 2013-03-14 NOTE — Assessment & Plan Note (Signed)
We have suggested that she continue to take her losartan in the morning, lisinopril at noon, that she increase her amlodipine to 10 mg at dinner, take this with clonidine 0.1 mg and before bed take clonidine one one half pills. I like to improve her blood pressure overnight so that she does not need to wake up at 3 in the morning to take clonidine. She is willing to try these changes and will call us with blood pressure measurements over the next week or so.

## 2013-03-14 NOTE — Patient Instructions (Addendum)
At dinner: Take amlodipine 10 mg one pill Take clonidine 0.1 mg one pill  Then before bed: Take clonidine  1 1/2  pills  Then in the AM,  take losartan  Then at noon: Take lisinopril one pill  Please monitor your blood pressure  Please call us if you have new issues that need to be addressed before your next appt.

## 2013-03-14 NOTE — Progress Notes (Signed)
   Patient ID: Yvonne Davidson, female    DOB: [birth year], 73 y.o.   MRN: [id number]  HPI Comments: Is a 73 year old woman with a history of hypertension, obstructive sleep apnea who is followed by the sleep Center at [facility], chronic pain in her legs, GERD who presents for evaluation of her blood pressure. She was  previously followed at [facility] for [facility] pressure .  In followup today, she reports that she takes losartan 100 mg in the morning, lisinopril 40 mg at noon, amlodipine 5 mg with dinner, clonidine one and one half pills before bed. She sets her alarm to wake up 4 hours later to take additional clonidine at 3 in the morning . This regimen, blood pressure continues to be slightly high, but much better . Overnight is typically her worst time as she believes her uncontrolled obstructive sleep apnea makes her blood pressure runs high . She has followup CPAP titration trial to wear CPAP with a mouth piece designed by [facility] dentistry .  Reports that she has chronic aching in her legs. She attributes this to sleep apnea and poor blood supply to her legs She recently retired at the age of 51. She was working in [employer]. She was unable to maintain the pace with such poor sleep on a chronic basis  EKG shows normal sinus rhythm with rate 72 beats a minute with no significant ST or T wave changes      Review of Systems  Constitutional: Negative.   HENT: Negative.   Eyes: Negative.   Respiratory: Negative.   Cardiovascular: Negative.   Gastrointestinal: Negative.   Endocrine: Negative.   Musculoskeletal: Negative.   Skin: Negative.   Allergic/Immunologic: Negative.   Neurological: Negative.   Hematological: Negative.   Psychiatric/Behavioral: Positive for sleep disturbance. The patient is nervous/anxious.   All other systems reviewed and are negative.      Physical Exam  Nursing note and vitals reviewed. Constitutional: She is oriented to person, place, and time.  She appears well-developed and well-nourished.  HENT:  Head: Normocephalic.  Nose: Nose normal.  Mouth/Throat: Oropharynx is clear and moist.  Eyes: Conjunctivae are normal. Pupils are equal, round, and reactive to light.  Neck: Normal range of motion. Neck supple. No JVD present.  Cardiovascular: Normal rate, regular rhythm, S1 normal, S2 normal, normal heart sounds and intact distal pulses.  Exam reveals no gallop and no friction rub.   No murmur heard. Pulmonary/Chest: Effort normal and breath sounds normal. No respiratory distress. She has no wheezes. She has no rales. She exhibits no tenderness.  Abdominal: Soft. Bowel sounds are normal. She exhibits no distension. There is no tenderness.  Musculoskeletal: Normal range of motion. She exhibits no edema and no tenderness.  Lymphadenopathy:    She has no cervical adenopathy.  Neurological: She is alert and oriented to person, place, and time. Coordination normal.  Skin: Skin is warm and dry. No rash noted. No erythema.  Psychiatric: She has a normal mood and affect. Her behavior is normal. Judgment and thought content normal.    Assessment and Plan

## 2013-03-15 NOTE — Telephone Encounter (Signed)
Noted  

## 2013-03-15 NOTE — Telephone Encounter (Signed)
That is fine 

## 2013-03-16 ENCOUNTER — Ambulatory Visit (INDEPENDENT_AMBULATORY_CARE_PROVIDER_SITE_OTHER): Payer: Commercial Managed Care - HMO | Admitting: Adult Health

## 2013-03-16 ENCOUNTER — Encounter: Payer: Self-pay | Admitting: Adult Health

## 2013-03-16 VITALS — BP 122/64 | HR 70 | Temp 98.3°F | Resp 14 | Wt 147.0 lb

## 2013-03-16 DIAGNOSIS — Z9109 Other allergy status, other than to drugs and biological substances: Secondary | ICD-10-CM

## 2013-03-16 DIAGNOSIS — Z889 Allergy status to unspecified drugs, medicaments and biological substances status: Secondary | ICD-10-CM | POA: Insufficient documentation

## 2013-03-16 DIAGNOSIS — J329 Chronic sinusitis, unspecified: Secondary | ICD-10-CM | POA: Insufficient documentation

## 2013-03-16 MED ORDER — AMOXICILLIN 500 MG PO CAPS: 500.0000 mg | ORAL_CAPSULE | Freq: Two times a day (BID) | ORAL | Status: DC

## 2013-03-16 MED ORDER — MUPIROCIN 2 % EX OINT: 1.0000 "application " | TOPICAL_OINTMENT | Freq: Two times a day (BID) | CUTANEOUS | Status: DC

## 2013-03-16 NOTE — Progress Notes (Signed)
Pre visit review using our clinic review tool, if applicable. No additional management support is needed unless otherwise documented below in the visit note. 
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
## 2013-04-24 ENCOUNTER — Ambulatory Visit: Payer: Self-pay | Admitting: Ophthalmology

## 2013-06-19 ENCOUNTER — Telehealth: Payer: Self-pay | Admitting: Internal Medicine

## 2013-06-19 NOTE — Telephone Encounter (Signed)
Pt called wanting to get 2 referral [facility]    Dr Henrene Dodge [phone] Sleep apnea  Orthopedic  Right Shoulder pain Chronic tendtias [facility]  Please advise when appointment are made

## 2013-06-19 NOTE — Telephone Encounter (Signed)
Left vm advising pt that an appt will need to be scheduled for a referral to be made

## 2013-06-21 ENCOUNTER — Ambulatory Visit (INDEPENDENT_AMBULATORY_CARE_PROVIDER_SITE_OTHER): Payer: Commercial Managed Care - HMO | Admitting: Internal Medicine

## 2013-06-21 ENCOUNTER — Encounter: Payer: Self-pay | Admitting: Internal Medicine

## 2013-06-21 VITALS — BP 134/68 | HR 64 | Temp 98.9°F | Resp 16 | Ht 63.0 in | Wt 143.2 lb

## 2013-06-21 DIAGNOSIS — M25511 Pain in right shoulder: Secondary | ICD-10-CM

## 2013-06-21 DIAGNOSIS — G4730 Sleep apnea, unspecified: Secondary | ICD-10-CM

## 2013-06-21 DIAGNOSIS — G4700 Insomnia, unspecified: Secondary | ICD-10-CM

## 2013-06-21 DIAGNOSIS — I10 Essential (primary) hypertension: Secondary | ICD-10-CM

## 2013-06-21 DIAGNOSIS — E063 Autoimmune thyroiditis: Secondary | ICD-10-CM

## 2013-06-21 DIAGNOSIS — M25519 Pain in unspecified shoulder: Secondary | ICD-10-CM

## 2013-06-21 DIAGNOSIS — F4323 Adjustment disorder with mixed anxiety and depressed mood: Secondary | ICD-10-CM

## 2013-06-21 LAB — CBC WITH DIFFERENTIAL/PLATELET
BASOS ABS: 0 10*3/uL (ref 0.0–0.1)
Basophils Relative: 0.3 % (ref 0.0–3.0)
EOS ABS: 0.2 10*3/uL (ref 0.0–0.7)
Eosinophils Relative: 2.3 % (ref 0.0–5.0)
HEMATOCRIT: 37.7 % (ref 36.0–46.0)
HEMOGLOBIN: 12.7 g/dL (ref 12.0–15.0)
LYMPHS ABS: 2.1 10*3/uL (ref 0.7–4.0)
LYMPHS PCT: 27.7 % (ref 12.0–46.0)
MCHC: 33.8 g/dL (ref 30.0–36.0)
MCV: 89.8 fl (ref 78.0–100.0)
MONOS PCT: 7.5 % (ref 3.0–12.0)
Monocytes Absolute: 0.6 10*3/uL (ref 0.1–1.0)
Neutro Abs: 4.7 10*3/uL (ref 1.4–7.7)
Neutrophils Relative %: 62.2 % (ref 43.0–77.0)
PLATELETS: 282 10*3/uL (ref 150.0–400.0)
RBC: 4.2 Mil/uL (ref 3.87–5.11)
RDW: 14.5 % (ref 11.5–15.5)
WBC: 7.6 10*3/uL (ref 4.0–10.5)

## 2013-06-21 LAB — COMPREHENSIVE METABOLIC PANEL
ALT: 17 U/L (ref 0–35)
AST: 20 U/L (ref 0–37)
Albumin: 4.1 g/dL (ref 3.5–5.2)
Alkaline Phosphatase: 63 U/L (ref 39–117)
BUN: 17 mg/dL (ref 6–23)
CALCIUM: 10 mg/dL (ref 8.4–10.5)
CO2: 28 mEq/L (ref 19–32)
Chloride: 106 mEq/L (ref 96–112)
Creatinine, Ser: 0.7 mg/dL (ref 0.4–1.2)
GFR: 91.7 mL/min (ref >60.00)
Glucose, Bld: 110 mg/dL — ABNORMAL HIGH (ref 70–99)
Potassium: 3.9 mEq/L (ref 3.5–5.1)
Sodium: 141 mEq/L (ref 135–145)
Total Bilirubin: 0.6 mg/dL (ref 0.2–1.2)
Total Protein: 6.5 g/dL (ref 6.0–8.3)

## 2013-06-21 LAB — TSH: TSH: 4.02 u[IU]/mL (ref 0.35–4.50)

## 2013-06-21 LAB — T4, FREE: Free T4: 0.69 ng/dL (ref 0.60–1.60)

## 2013-06-21 MED ORDER — CLONAZEPAM 0.5 MG PO TABS: 0.5000 mg | ORAL_TABLET | Freq: Every evening | ORAL | Status: DC | PRN

## 2013-06-21 NOTE — Assessment & Plan Note (Addendum)
Long history of right shoulder pain, improved in the past with PT. Previously diagnosed with tendonitis, per her report. No findings to suggest rotator cuff injury. Will set up PT.

## 2013-06-21 NOTE — Progress Notes (Signed)
Subjective:    Patient ID: Yvonne Davidson, female    DOB: 04/09/1940, 73 y.o.   MRN: [id number]  HPI 73YO female presents for follow up.  Sleep apnea - using mouth assist device with no improvement. Not sleeping well. Notes abdominal distension on waking.  Notes BP elevated at night. SBP 200s. Feels depressed because unable to sleep. Has some suicidal thoughts, but no plan and will contract for safety. Feels exhausted.  Right shoulder pain - ongoing for years. Has had PT In the past. Diagnosed with chronic tendonitis. Cannot sleep on right side because of pain. Pain described as aching. Does not radiate. No weakness or numbness.  Review of Systems  Constitutional: Negative for fever, chills, appetite change, fatigue and unexpected weight change.  HENT: Negative for congestion, ear pain, sinus pressure, sore throat, trouble swallowing and voice change.   Eyes: Negative for visual disturbance.  Respiratory: Negative for cough, shortness of breath, wheezing and stridor.   Cardiovascular: Negative for chest pain, palpitations and leg swelling.  Gastrointestinal: Negative for nausea, vomiting, abdominal pain, diarrhea, constipation, blood in stool, abdominal distention and anal bleeding.  Genitourinary: Negative for dysuria and flank pain.  Musculoskeletal: Positive for arthralgias and myalgias. Negative for gait problem and neck pain.  Skin: Negative for color change and rash.  Neurological: Negative for dizziness and headaches.  Hematological: Negative for adenopathy. Does not bruise/bleed easily.  Psychiatric/Behavioral: Positive for sleep disturbance and dysphoric mood. Negative for suicidal ideas. The patient is nervous/anxious.        Objective:    BP 134/68  Pulse 64  Temp(Src) 98.9 F (37.2 C) (Oral)  Resp 16  Ht _0  (1.6 m)  Wt 143 lb 4 oz (64.978 kg)  BMI 25.38 kg/m2  SpO2 98% Physical Exam  Constitutional: She is oriented to person, place, and time. She appears  well-developed and well-nourished. No distress.  HENT:  Head: Normocephalic and atraumatic.  Right Ear: External ear normal.  Left Ear: External ear normal.  Nose: Nose normal.  Mouth/Throat: Oropharynx is clear and moist. No oropharyngeal exudate.  Eyes: Conjunctivae are normal. Pupils are equal, round, and reactive to light. Right eye exhibits no discharge. Left eye exhibits no discharge. No scleral icterus.  Neck: Normal range of motion. Neck supple. No tracheal deviation present. No thyromegaly present.  Cardiovascular: Normal rate, regular rhythm, normal heart sounds and intact distal pulses.  Exam reveals no gallop and no friction rub.   No murmur heard. Pulmonary/Chest: Effort normal and breath sounds normal. No accessory muscle usage. Not tachypneic. No respiratory distress. She has no decreased breath sounds. She has no wheezes. She has no rhonchi. She has no rales. She exhibits no tenderness.  Musculoskeletal: Normal range of motion. She exhibits no edema.       Right shoulder: She exhibits tenderness (diffuse) and pain. She exhibits normal range of motion and no bony tenderness.  Lymphadenopathy:    She has no cervical adenopathy.  Neurological: She is alert and oriented to person, place, and time. No cranial nerve deficit. She exhibits normal muscle tone. Coordination normal.  Skin: Skin is warm and dry. No rash noted. She is not diaphoretic. No erythema. No pallor.  Psychiatric: Judgment and thought content normal. Her mood appears anxious. Her speech is rapid and/or pressured. She is agitated. Cognition and memory are normal. She exhibits a depressed mood. She expresses no suicidal ideation. She expresses no suicidal plans.          Assessment & Plan:  Problem List Items Addressed This Visit     Unprioritized   Adjustment disorder with mixed anxiety and depressed mood     Appears anxious today, likely secondary to sleep deprivation. Will start clonazepam at bedtime to  help with symptoms of anxiety and insomnia. She will contract for safety and will call if worsening symptoms of anxiety or depression, suicidal ideation.    Hashimoto's disease     Will recheck thyroid function with labs today, given recent fatigue.    Relevant Orders      CBC w/Diff      TSH      T4, free   Hypertension      BP Readings from Last 3 Encounters:  06/21/13 134/68  03/16/13 122/64  03/14/13 142/68   BP well controlled on current regimen. Followed by Dr. Rockey Situ. Will check renal function with labs today.    Relevant Orders      Comp Met (CMET)   Insomnia     Recent insomnia related to CPAP and use of mouth device. Will set up evaluation with Dr. Ronnald Ramp in [place] at [facility] to see if any additional intervention might be helpful. Will also start Clonazepam 0.44m daily to help with insomnia and anxiety at night. Follow up 4 weeks and prn.    Relevant Medications      clonazePAM (KLONOPIN)  tablet   Right shoulder pain     Long history of right shoulder pain, improved in the past with PT. Previously diagnosed with tendonitis, per her report. No findings to suggest rotator cuff injury. Will set up PT.    Relevant Orders      Ambulatory referral to Physical Therapy   Sleep apnea - Primary     Not tolerated CPAP well because of issues with mouth device. [facility]     Relevant Orders      Ambulatory referral to General Surgery       Return in about 4 weeks (around 07/19/2013).

## 2013-06-21 NOTE — Assessment & Plan Note (Signed)
Recent insomnia related to CPAP and use of mouth device. Will set up evaluation with Dr. Ronnald Ramp in [place] at [facility] to see if any additional intervention might be helpful. Will also start Clonazepam 0.5mg  daily to help with insomnia and anxiety at night. Follow up 4 weeks and prn.

## 2013-06-21 NOTE — Progress Notes (Signed)
Pre-visit discussion using our clinic review tool. No additional management support is needed unless otherwise documented below in the visit note.  

## 2013-06-21 NOTE — Assessment & Plan Note (Signed)
Appears anxious today, likely secondary to sleep deprivation. Will start clonazepam at bedtime to help with symptoms of anxiety and insomnia. She will contract for safety and will call if worsening symptoms of anxiety or depression, suicidal ideation.

## 2013-06-21 NOTE — Assessment & Plan Note (Signed)
Will recheck thyroid function with labs today, given recent fatigue.

## 2013-06-21 NOTE — Assessment & Plan Note (Signed)
BP Readings from Last 3 Encounters:  06/21/13 134/68  03/16/13 122/64  03/14/13 142/68   BP well controlled on current regimen. Followed by Dr. Rockey Situ. Will check renal function with labs today.

## 2013-06-21 NOTE — Assessment & Plan Note (Signed)
Not tolerated CPAP well because of issues with mouth device. Will set up evaluation with Dr. Ronnald Ramp in [place] at [facility] to see if any additional intervention might be helpful.

## 2013-06-21 NOTE — Patient Instructions (Signed)
Start Clonazepam 0.5mg  daily at bedtime to help with insomnia.  We will set up physical therapy for your right shoulder and an evaluation with Dr. Ronnald Ramp for sleep apnea issues.  Follow up here in 4 weeks or sooner as needed.

## 2013-06-22 ENCOUNTER — Telehealth: Payer: Self-pay | Admitting: Internal Medicine

## 2013-06-22 NOTE — Telephone Encounter (Signed)
Relevant patient education assigned to patient using Emmi. ° °

## 2013-07-02 ENCOUNTER — Encounter: Payer: Self-pay | Admitting: Internal Medicine

## 2013-07-23 ENCOUNTER — Ambulatory Visit (INDEPENDENT_AMBULATORY_CARE_PROVIDER_SITE_OTHER): Payer: Commercial Managed Care - HMO | Admitting: Internal Medicine

## 2013-07-23 ENCOUNTER — Encounter: Payer: Self-pay | Admitting: Internal Medicine

## 2013-07-23 VITALS — BP 112/72 | HR 62 | Temp 98.2°F | Ht 63.5 in | Wt 145.2 lb

## 2013-07-23 DIAGNOSIS — R739 Hyperglycemia, unspecified: Secondary | ICD-10-CM

## 2013-07-23 DIAGNOSIS — E785 Hyperlipidemia, unspecified: Secondary | ICD-10-CM

## 2013-07-23 DIAGNOSIS — L989 Disorder of the skin and subcutaneous tissue, unspecified: Secondary | ICD-10-CM | POA: Insufficient documentation

## 2013-07-23 DIAGNOSIS — I10 Essential (primary) hypertension: Secondary | ICD-10-CM

## 2013-07-23 DIAGNOSIS — R7309 Other abnormal glucose: Secondary | ICD-10-CM

## 2013-07-23 DIAGNOSIS — Z0000 Encounter for general adult medical examination without abnormal findings: Secondary | ICD-10-CM

## 2013-07-23 DIAGNOSIS — R5381 Other malaise: Secondary | ICD-10-CM | POA: Insufficient documentation

## 2013-07-23 DIAGNOSIS — R5383 Other fatigue: Secondary | ICD-10-CM

## 2013-07-23 DIAGNOSIS — E063 Autoimmune thyroiditis: Secondary | ICD-10-CM

## 2013-07-23 LAB — MICROALBUMIN / CREATININE URINE RATIO
Creatinine,U: 73.1 mg/dL
Microalb Creat Ratio: 0.4 mg/g (ref 0.0–30.0)
Microalb, Ur: 0.3 mg/dL (ref 0.0–1.9)

## 2013-07-23 LAB — CBC WITH DIFFERENTIAL/PLATELET
BASOS ABS: 0 10*3/uL (ref 0.0–0.1)
Basophils Relative: 0.4 % (ref 0.0–3.0)
Eosinophils Absolute: 0.1 10*3/uL (ref 0.0–0.7)
Eosinophils Relative: 1.7 % (ref 0.0–5.0)
HCT: 38.6 % (ref 36.0–46.0)
Hemoglobin: 13.1 g/dL (ref 12.0–15.0)
Lymphocytes Relative: 25 % (ref 12.0–46.0)
Lymphs Abs: 1.7 10*3/uL (ref 0.7–4.0)
MCHC: 33.8 g/dL (ref 30.0–36.0)
MCV: 89.4 fl (ref 78.0–100.0)
MONO ABS: 0.4 10*3/uL (ref 0.1–1.0)
Monocytes Relative: 6.4 % (ref 3.0–12.0)
NEUTROS ABS: 4.6 10*3/uL (ref 1.4–7.7)
Neutrophils Relative %: 66.5 % (ref 43.0–77.0)
PLATELETS: 278 10*3/uL (ref 150.0–400.0)
RBC: 4.32 Mil/uL (ref 3.87–5.11)
RDW: 14.4 % (ref 11.5–15.5)
WBC: 6.9 10*3/uL (ref 4.0–10.5)

## 2013-07-23 LAB — COMPREHENSIVE METABOLIC PANEL
ALT: 27 U/L (ref 0–35)
AST: 25 U/L (ref 0–37)
Albumin: 4.2 g/dL (ref 3.5–5.2)
Alkaline Phosphatase: 72 U/L (ref 39–117)
BILIRUBIN TOTAL: 0.4 mg/dL (ref 0.2–1.2)
BUN: 13 mg/dL (ref 6–23)
CO2: 27 mEq/L (ref 19–32)
Calcium: 9.1 mg/dL (ref 8.4–10.5)
Chloride: 102 mEq/L (ref 96–112)
Creatinine, Ser: 0.6 mg/dL (ref 0.4–1.2)
GFR: 96.65 mL/min (ref >60.00)
Glucose, Bld: 106 mg/dL — ABNORMAL HIGH (ref 70–99)
Potassium: 4.1 mEq/L (ref 3.5–5.1)
SODIUM: 139 meq/L (ref 135–145)
TOTAL PROTEIN: 7.1 g/dL (ref 6.0–8.3)

## 2013-07-23 LAB — LIPID PANEL
CHOL/HDL RATIO: 6
Cholesterol: 274 mg/dL — ABNORMAL HIGH (ref 0–200)
HDL: 43.4 mg/dL (ref >39.00)
LDL CALC: 198 mg/dL — AB (ref 0–99)
NONHDL: 230.6
Triglycerides: 162 mg/dL — ABNORMAL HIGH (ref 0.0–149.0)
VLDL: 32.4 mg/dL (ref 0.0–40.0)

## 2013-07-23 LAB — T4, FREE: FREE T4: 0.64 ng/dL (ref 0.60–1.60)

## 2013-07-23 LAB — TSH: TSH: 4.19 u[IU]/mL (ref 0.35–4.50)

## 2013-07-23 LAB — VITAMIN B12: VITAMIN B 12: 1059 pg/mL — AB (ref 211–911)

## 2013-07-23 LAB — HEMOGLOBIN A1C: Hgb A1c MFr Bld: 6.8 % — ABNORMAL HIGH (ref 4.6–6.5)

## 2013-07-23 NOTE — Progress Notes (Signed)
Subjective:    Patient ID: Yvonne Davidson, female    DOB: 05/29/1940, 73 y.o.   MRN: [id number]  HPI The patient is here for annual Medicare wellness examination and management of other chronic and acute problems.   The risk factors are reflected in the social history.  The roster of all physicians providing medical care to patient - is listed in the Snapshot section of the chart.  Activities of daily living:  The patient is 100% independent in all ADLs: dressing, toileting, feeding as well as independent mobility. Lives with husband, recently married. No pets. Lives in two story home. Has hard floors.  Home safety : The patient has smoke detectors in the home. They wear seatbelts.  There are no firearms at home. There is no violence in the home.   There is no risks for hepatitis, STDs or HIV. There is no history of blood transfusion. They have no travel history to infectious disease endemic areas of the world.  The patient has seen their dentist in the last six month. Dr. Rada Hay They have seen their eye doctor in the last year. Had cataract surgery in March. Dr. Tobe Sos No issues with hearing. Some issues in the past, seen by Dr. Kathyrn Sheriff and told not a candidate for hearing aids. They have deferred audiologic testing in the last year.   They do not  have excessive sun exposure. Discussed the need for sun protection: hats, long sleeves and use of sunscreen if there is significant sun exposure.  Dr. Gilles Chiquito - cardiologist Dr. Lake Bells - Pulmonologist  Diet: the importance of a healthy diet is discussed. They do have a healthy diet.  The benefits of regular aerobic exercise were discussed. She walks stairs.  Depression screen: there are no signs or vegative symptoms of depression- irritability, change in appetite, anhedonia, sadness/tearfullness.  Cognitive assessment: the patient manages all their financial and personal affairs and is actively engaged. They could relate day,date,year  and events.  The following portions of the patient's history were reviewed and updated as appropriate: allergies, current medications, past family history, past medical history,  past surgical history, past social history  and problem list.  Visual acuity was not assessed per patient preference since she has regular follow up with her ophthalmologist. Hearing and body mass index were assessed and reviewed.   During the course of the visit the patient was educated and counseled about appropriate screening and preventive services including : fall prevention , diabetes screening, nutrition counseling, colorectal cancer screening, and recommended immunizations.    BP 112/72  Pulse 62  Temp(Src) 98.2 F (36.8 C) (Oral)  Ht 5' 3.5" (1.613 m)  Wt 145 lb 4 oz (65.885 kg)  BMI 25.32 kg/m2  SpO2 97%    Review of Systems  Constitutional: Positive for fatigue. Negative for fever, chills, appetite change and unexpected weight change.  HENT: Negative for congestion, ear pain, sinus pressure, sore throat, trouble swallowing and voice change.   Eyes: Negative for visual disturbance.  Respiratory: Negative for cough, shortness of breath, wheezing and stridor.   Cardiovascular: Negative for chest pain, palpitations and leg swelling.  Gastrointestinal: Negative for nausea, vomiting, abdominal pain, diarrhea, constipation, blood in stool, abdominal distention and anal bleeding.  Genitourinary: Negative for dysuria and flank pain.  Musculoskeletal: Negative for arthralgias, gait problem, myalgias and neck pain.  Skin: Negative for color change and rash.  Neurological: Negative for dizziness and headaches.  Hematological: Negative for adenopathy. Does not bruise/bleed easily.  Psychiatric/Behavioral: Negative for  suicidal ideas, sleep disturbance and dysphoric mood. The patient is not nervous/anxious.        Objective:   Physical Exam  Constitutional: She is oriented to person, place, and time. She  appears well-developed and well-nourished. No distress.  HENT:  Head: Normocephalic and atraumatic.  Right Ear: External ear normal.  Left Ear: External ear normal.  Nose: Nose normal.  Mouth/Throat: Oropharynx is clear and moist. No oropharyngeal exudate.  Eyes: Conjunctivae are normal. Pupils are equal, round, and reactive to light. Right eye exhibits no discharge. Left eye exhibits no discharge. No scleral icterus.  Neck: Normal range of motion. Neck supple. No tracheal deviation present. No thyromegaly present.  Cardiovascular: Normal rate, regular rhythm, normal heart sounds and intact distal pulses.  Exam reveals no gallop and no friction rub.   No murmur heard. Pulmonary/Chest: Effort normal and breath sounds normal. No accessory muscle usage. Not tachypneic. No respiratory distress. She has no decreased breath sounds. She has no wheezes. She has no rhonchi. She has no rales. She exhibits no tenderness. Right breast exhibits no inverted nipple, no mass, no nipple discharge, no skin change and no tenderness. Left breast exhibits no inverted nipple, no mass, no nipple discharge, no skin change and no tenderness. Breasts are symmetrical.  Abdominal: Soft. Bowel sounds are normal. She exhibits no distension and no mass. There is no tenderness. There is no rebound and no guarding.  Musculoskeletal: Normal range of motion. She exhibits no edema and no tenderness.  Lymphadenopathy:    She has no cervical adenopathy.  Neurological: She is alert and oriented to person, place, and time. No cranial nerve deficit. She exhibits normal muscle tone. Coordination normal.  Skin: Skin is warm and dry. No rash noted. She is not diaphoretic. No erythema. No pallor.     Psychiatric: She has a normal mood and affect. Her behavior is normal. Judgment and thought content normal.          Assessment & Plan:

## 2013-07-23 NOTE — Assessment & Plan Note (Signed)
BP Readings from Last 3 Encounters:  07/23/13 112/72  06/21/13 134/68  03/16/13 122/64   BP well controlled on current medications. Will continue. Will check renal function with labs.

## 2013-07-23 NOTE — Assessment & Plan Note (Signed)
Erythematous plaque left forearm. Will set up dermatology evaluation for possible biopsy.

## 2013-07-23 NOTE — Assessment & Plan Note (Signed)
General medical exam including breast exam normal today. PAP and pelvic deferred per pt preference and given h/o all normal PAPs. Appropriate screening performed. Health maintenance is UTD except for pneumovax, which pt declines. Will check CBC, CMP, lipids, TSH with labs. Encouraged healthy diet and regular physical activity. Will have pt follow up here in 6 months and prn.

## 2013-07-23 NOTE — Assessment & Plan Note (Signed)
Will recheck A1c with labs today.  

## 2013-07-23 NOTE — Patient Instructions (Signed)
It was nice to see you today.  You are doing well.  Continue healthy diet and set a goal of exercising 40 minutes three times per week.

## 2013-07-23 NOTE — Assessment & Plan Note (Signed)
Will check lipids and LFTs with labs today. 

## 2013-07-23 NOTE — Progress Notes (Signed)
Pre visit review using our clinic review tool, if applicable. No additional management support is needed unless otherwise documented below in the visit note. 

## 2013-07-23 NOTE — Assessment & Plan Note (Signed)
Some persistent symptoms of fatigue. Will check TSH, B12, CBC with labs today. Continue treatment of OSA>

## 2013-07-25 ENCOUNTER — Other Ambulatory Visit: Payer: Self-pay | Admitting: *Deleted

## 2013-07-25 ENCOUNTER — Encounter: Payer: Self-pay | Admitting: Internal Medicine

## 2013-07-25 MED ORDER — METFORMIN HCL 500 MG PO TABS: 500.0000 mg | ORAL_TABLET | Freq: Two times a day (BID) | ORAL | Status: DC

## 2013-08-15 ENCOUNTER — Telehealth: Payer: Self-pay | Admitting: Internal Medicine

## 2013-08-15 NOTE — Telephone Encounter (Signed)
Left detailed message on pts VM requesting her to return call advising name of Orthopedist.

## 2013-08-15 NOTE — Telephone Encounter (Signed)
The patient has finished physical therapy and her therapist has released her to go to an orthopedist . She wants a referral to Dr. Nehemiah Massed

## 2013-08-15 NOTE — Telephone Encounter (Signed)
Please see Dr Thomes Dinning note to you

## 2013-08-15 NOTE — Telephone Encounter (Signed)
Dr. Nehemiah Massed is not an orthopedic surgeon? Can you clarify?

## 2013-08-16 ENCOUNTER — Encounter: Payer: Self-pay | Admitting: Adult Health

## 2013-08-16 ENCOUNTER — Ambulatory Visit (INDEPENDENT_AMBULATORY_CARE_PROVIDER_SITE_OTHER): Payer: Commercial Managed Care - HMO | Admitting: Adult Health

## 2013-08-16 VITALS — BP 125/71 | HR 64 | Temp 98.1°F | Resp 14 | Ht 63.5 in | Wt 143.5 lb

## 2013-08-16 DIAGNOSIS — R197 Diarrhea, unspecified: Secondary | ICD-10-CM

## 2013-08-16 LAB — CBC WITH DIFFERENTIAL/PLATELET
Basophils Absolute: 0 10*3/uL (ref 0.0–0.1)
Basophils Relative: 0.3 % (ref 0.0–3.0)
Eosinophils Absolute: 0.2 10*3/uL (ref 0.0–0.7)
Eosinophils Relative: 1.9 % (ref 0.0–5.0)
HCT: 40.9 % (ref 36.0–46.0)
HEMOGLOBIN: 13.6 g/dL (ref 12.0–15.0)
Lymphocytes Relative: 25.8 % (ref 12.0–46.0)
Lymphs Abs: 2.1 10*3/uL (ref 0.7–4.0)
MCHC: 33.2 g/dL (ref 30.0–36.0)
MCV: 90.2 fl (ref 78.0–100.0)
Monocytes Absolute: 0.7 10*3/uL (ref 0.1–1.0)
Monocytes Relative: 8.4 % (ref 3.0–12.0)
NEUTROS PCT: 63.6 % (ref 43.0–77.0)
Neutro Abs: 5.1 10*3/uL (ref 1.4–7.7)
Platelets: 285 10*3/uL (ref 150.0–400.0)
RBC: 4.54 Mil/uL (ref 3.87–5.11)
RDW: 14.4 % (ref 11.5–15.5)
WBC: 8.1 10*3/uL (ref 4.0–10.5)

## 2013-08-16 LAB — BASIC METABOLIC PANEL
BUN: 15 mg/dL (ref 6–23)
CO2: 26 meq/L (ref 19–32)
Calcium: 9.2 mg/dL (ref 8.4–10.5)
Chloride: 106 mEq/L (ref 96–112)
Creatinine, Ser: 0.7 mg/dL (ref 0.4–1.2)
GFR: 90.11 mL/min (ref >60.00)
GLUCOSE: 114 mg/dL — AB (ref 70–99)
POTASSIUM: 4.3 meq/L (ref 3.5–5.1)
Sodium: 138 mEq/L (ref 135–145)

## 2013-08-16 NOTE — Progress Notes (Signed)
Pre visit review using our clinic review tool, if applicable. No additional management support is needed unless otherwise documented below in the visit note. 

## 2013-08-16 NOTE — Patient Instructions (Signed)
  Please provide [place] with stool samples so that we can evaluate your diarrhea.  Please have labs drawn. We will call you with results and any further instructions.  B12 is above normal range. Hold off on taking this for a little while.  Follow up in August with Dr. Gilford Rile

## 2013-08-16 NOTE — Progress Notes (Signed)
Patient ID: Yvonne Davidson, female   DOB: [birth year], 73 y.o.   MRN: [id number]   Subjective:    Patient ID: Yvonne Davidson, female    DOB: February 13, 1940, 73 y.o.   MRN: [id number]  HPI  Pt has a hx of chronic diarrhea "for years" but for the past 1-2 weeks she reports having "projectile diarrhea". She reports starting metformin 500 mg bid but she has been cutting these in half and only taking 250 mg with meals. She is concerns because of her multiple medication allergies/sensitivities. Pt reports that she called Dr. Percell Boston office regarding her diarrhea but they recommended that she be seen at her PCP's office. No blood in stool. She is concerned that she could have picked something up at a restaurant while eating out. She feels fatigue but also reports this is a chronic problem for her. No fever (although pt reports that her normal temp is 97.5), chills, abdominal pain.   Past Medical History  Diagnosis Date  . Chronic sinusitis     takes Cetirizine daily  . Hashimoto's thyroiditis   . Allergy   . Chronic bronchitis     last time 2 yrs ago  . Arthritis   . Heart murmur   . Hyperlipidemia     doesn't take any meds for this  . Osteoporosis     takes Vit D as needed  . PONV (postoperative nausea and vomiting)     hard to wake up  . Hypertension     takes Losartan and Clonidine nightly  . OSA (obstructive sleep apnea)     doesn't use a cpap  . Gastritis   . H/O hiatal hernia   . Diverticulosis   . Urinary frequency   . Anemia   . History of blood transfusion     in the 60's no abnormal reaction noted  . Insomnia     takes Trazodone nightly  . Diabetes mellitus without complication     borderline  . Bilateral cataracts     immature  . Dry eyes   . Anxiety   . Depression     doesn't take any meds  . GERD (gastroesophageal reflux disease)     TAKES OMEPRAZOLE DAILY    Current Outpatient Prescriptions on File Prior to Visit  Medication Sig Dispense Refill  .  Artificial Tear Ointment (REFRESH LACRI-LUBE) OINT Apply 1 application to eye at bedtime.      . carboxymethylcellulose (REFRESH PLUS) 0.5 % SOLN Place 1 drop into both eyes 3 (three) times daily as needed (for dry eyes).       . clonazePAM (KLONOPIN) 0.5 MG tablet Take 1 tablet (0.5 mg total) by mouth at bedtime as needed for anxiety.  30 tablet  1  . cloNIDine (CATAPRES) 0.1 MG tablet Take 1 tablet (0.1 mg total) by mouth 3 (three) times daily.  90 tablet  11  . lipase/protease/amylase (CREON-12/PANCREASE) 12000 UNITS CPEP capsule Take by mouth.      Marland Kitchen lisinopril (PRINIVIL,ZESTRIL) 40 MG tablet Take 1 tablet (40 mg total) by mouth daily.  90 tablet  3  . losartan (COZAAR) 100 MG tablet Take 1 tablet (100 mg total) by mouth every morning.  90 tablet  3  . metFORMIN (GLUCOPHAGE) 500 MG tablet Take 1 tablet (500 mg total) by mouth 2 (two) times daily with a meal.  60 tablet  3  . mupirocin ointment (BACTROBAN) 2 % Place 1 application into the nose 2 (two) times daily. Mupirocin 2% ointment  in NS #1000 Use 30 cc twice a day (15 cc each nostril)  30 g  4  . omeprazole (PRILOSEC) 20 MG capsule Take 20 mg by mouth 2 (two) times daily.       . Pancrelipase, Lip-Prot-Amyl, (CREON) 24000 UNITS CPEP Take 1 capsule (24,000 Units total) by mouth daily.  180 capsule  3  . PRESCRIPTION MEDICATION Place 1 Applicatorful into the nose 2 (two) times daily as needed (sodium chloride/mupiricin  compounded medication for prevent sinus infections).      Marland Kitchen Respiratory Therapy Supplies (ONE FLOW SPIROMETER) DEVI Please provide incentive spirometer DX: Asthma  1 each  0   No current facility-administered medications on file prior to visit.     Review of Systems Positive for explosive diarrhea x 1-2 weeks, fatigue Negative for fever, chills, abdominal pain, bloody stools    Objective:  BP 125/71  Pulse 64  Temp(Src) 98.1 F (36.7 C) (Oral)  Resp 14  Ht 5' 3.5" (1.613 m)  Wt 143 lb 8 oz (65.091 kg)  BMI 25.02  kg/m2  SpO2 99%   Physical Exam  Constitutional: She is oriented to person, place, and time. No distress.  Cardiovascular: Normal rate and regular rhythm.   Pulmonary/Chest: Effort normal. No respiratory distress.  Musculoskeletal: Normal range of motion.  Neurological: She is alert and oriented to person, place, and time.  Skin: Skin is warm and dry.  Psychiatric: She has a normal mood and affect. Her behavior is normal. Judgment and thought content normal.      Assessment & Plan:   1. Diarrhea ?metformin although patient is taking a very small dose (250 mg bid). She could have possibly picked up something while dinning out. Will check labs. Encouraged fluids. At this time I do not want her to take imodium until we can determine if any infection. She has a f/u appt with Dr. Gilford Rile in early part of August. - CBC with Differential - Basic metabolic panel - Stool culture - Ova and parasite examination - Clostridium difficile EIA

## 2013-08-17 ENCOUNTER — Other Ambulatory Visit: Payer: Self-pay | Admitting: Adult Health

## 2013-08-17 ENCOUNTER — Encounter: Payer: Self-pay | Admitting: Adult Health

## 2013-08-17 NOTE — Telephone Encounter (Signed)
We should set up a visit here so that I can determine what this referral is for and set her up with the appropriate surgeon.

## 2013-08-17 NOTE — Telephone Encounter (Signed)
Pt states Yvonne Davidson was the name her physiacl therapist recommended. She does not have a preference, whoever Dr. Gilford Rile recommends in [place]. Advised our [facility] will call with an appointment once it has been scheduled, verbalized understanding.

## 2013-08-18 LAB — C. DIFFICILE GDH AND TOXIN A/B
C. DIFF TOXIN A/B: NOT DETECTED
C. difficile GDH: NOT DETECTED

## 2013-08-20 ENCOUNTER — Telehealth: Payer: Self-pay | Admitting: Adult Health

## 2013-08-20 ENCOUNTER — Encounter: Payer: Self-pay | Admitting: Adult Health

## 2013-08-20 LAB — OVA AND PARASITE EXAMINATION: OP: NONE SEEN

## 2013-08-20 NOTE — Telephone Encounter (Signed)
Returned pts call, no answer, left msg for pt to return call.

## 2013-08-20 NOTE — Telephone Encounter (Signed)
Left a message to give a call back to confirm appointment.

## 2013-08-20 NOTE — Telephone Encounter (Signed)
Pt needs an appointment per Dr Gilford Rile.

## 2013-08-20 NOTE — Telephone Encounter (Signed)
The patient is needing her labs results her diarrhea is getting worse.

## 2013-08-21 ENCOUNTER — Encounter: Payer: Self-pay | Admitting: Adult Health

## 2013-08-21 LAB — STOOL CULTURE

## 2013-08-21 NOTE — Telephone Encounter (Signed)
Pt returned call, left message on VM requesting lab results.  I returned pts call, left message on Vm that results were viewable on [facility], if pt had further questions or concerns after reviewing results for pt to call back.

## 2013-08-22 ENCOUNTER — Encounter: Payer: Self-pay | Admitting: Adult Health

## 2013-08-22 ENCOUNTER — Telehealth: Payer: Self-pay | Admitting: *Deleted

## 2013-08-22 ENCOUNTER — Ambulatory Visit (INDEPENDENT_AMBULATORY_CARE_PROVIDER_SITE_OTHER): Payer: Commercial Managed Care - HMO | Admitting: Adult Health

## 2013-08-22 VITALS — BP 136/78 | HR 65 | Temp 98.1°F | Resp 14 | Wt 147.2 lb

## 2013-08-22 DIAGNOSIS — R197 Diarrhea, unspecified: Secondary | ICD-10-CM

## 2013-08-22 NOTE — Progress Notes (Signed)
Patient ID: Yvonne Davidson, female   DOB: 1940-10-23, 73 y.o.   MRN: [id number]   Subjective:    Patient ID: Yvonne Davidson, female    DOB: 31-Oct-1940, 73 y.o.   MRN: [id number]  HPI  Presents to clinic with ongoing diarrhea, cramping. She was seen in clinic on 08/16/13 with the same problem. Stool samples negative for c diff, ova & parasite, culture. CBC and bmet was normal. She reports hx of overgrowth of yeast. She is followed at [facility] but insurance requires referral. No fever, chills.  Past Medical History  Diagnosis Date  . Chronic sinusitis     takes Cetirizine daily  . Hashimoto's thyroiditis   . Allergy   . Chronic bronchitis     last time 2 yrs ago  . Arthritis   . Heart murmur   . Hyperlipidemia     doesn't take any meds for this  . Osteoporosis     takes Vit D as needed  . PONV (postoperative nausea and vomiting)     hard to wake up  . Hypertension     takes Losartan and Clonidine nightly  . OSA (obstructive sleep apnea)     doesn't use a cpap  . Gastritis   . H/O hiatal hernia   . Diverticulosis   . Urinary frequency   . Anemia   . History of blood transfusion     in the 60's no abnormal reaction noted  . Insomnia     takes Trazodone nightly  . Diabetes mellitus without complication     borderline  . Bilateral cataracts     immature  . Dry eyes   . Anxiety   . Depression     doesn't take any meds  . GERD (gastroesophageal reflux disease)     TAKES OMEPRAZOLE DAILY    Current Outpatient Prescriptions on File Prior to Visit  Medication Sig Dispense Refill  . Artificial Tear Ointment (REFRESH LACRI-LUBE) OINT Apply 1 application to eye at bedtime.      . carboxymethylcellulose (REFRESH PLUS) 0.5 % SOLN Place 1 drop into both eyes 3 (three) times daily as needed (for dry eyes).       . clonazePAM (KLONOPIN) 0.5 MG tablet Take 1 tablet (0.5 mg total) by mouth at bedtime as needed for anxiety.  30 tablet  1  . cloNIDine (CATAPRES) 0.1 MG tablet Take 1  tablet (0.1 mg total) by mouth 3 (three) times daily.  90 tablet  11  . lipase/protease/amylase (CREON-12/PANCREASE) 12000 UNITS CPEP capsule Take by mouth.      Marland Kitchen lisinopril (PRINIVIL,ZESTRIL) 40 MG tablet Take 1 tablet (40 mg total) by mouth daily.  90 tablet  3  . losartan (COZAAR) 100 MG tablet Take 1 tablet (100 mg total) by mouth every morning.  90 tablet  3  . metFORMIN (GLUCOPHAGE) 500 MG tablet Take 1 tablet (500 mg total) by mouth 2 (two) times daily with a meal.  60 tablet  3  . mupirocin ointment (BACTROBAN) 2 % Place 1 application into the nose 2 (two) times daily. Mupirocin 2% ointment in NS #1000 Use 30 cc twice a day (15 cc each nostril)  30 g  4  . omeprazole (PRILOSEC) 20 MG capsule Take 20 mg by mouth 2 (two) times daily.       . Pancrelipase, Lip-Prot-Amyl, (CREON) 24000 UNITS CPEP Take 1 capsule (24,000 Units total) by mouth daily.  180 capsule  3  . PRESCRIPTION MEDICATION Place 1 Applicatorful  into the nose 2 (two) times daily as needed (sodium chloride/mupiricin  compounded medication for prevent sinus infections).      Marland Kitchen Respiratory Therapy Supplies (ONE FLOW SPIROMETER) DEVI Please provide incentive spirometer DX: Asthma  1 each  0   No current facility-administered medications on file prior to visit.     Review of Systems  Constitutional: Negative.   HENT: Negative.   Eyes: Negative.   Respiratory: Negative.   Cardiovascular: Negative.   Gastrointestinal: Positive for diarrhea. Negative for abdominal pain and blood in stool.  Endocrine: Negative.   Genitourinary: Negative.   Musculoskeletal: Negative.   Skin: Negative.   Allergic/Immunologic: Negative.   Neurological: Negative.   Hematological: Negative.   Psychiatric/Behavioral: Negative.        Objective:  BP 136/78  Pulse 65  Temp(Src) 98.1 F (36.7 C) (Oral)  Resp 14  Wt 147 lb 4 oz (66.792 kg)  SpO2 98%   Physical Exam  Constitutional: She is oriented to person, place, and time. No distress.   Cardiovascular: Normal rate and regular rhythm.   Pulmonary/Chest: Effort normal. No respiratory distress.  Abdominal: Soft. Bowel sounds are normal. She exhibits no distension.  Flatulence   Musculoskeletal: Normal range of motion.  Neurological: She is alert and oriented to person, place, and time.  Skin: Skin is warm and dry.  Psychiatric: She has a normal mood and affect. Her behavior is normal. Judgment and thought content normal.      Assessment & Plan:   1. Diarrhea Ongoing symptoms. Stool samples negative. Cbc and bmet normal. Imodium OTC as needed. Appt with Dr. Vira Agar on 09/06/13 - Ambulatory referral to Gastroenterology

## 2013-08-22 NOTE — Progress Notes (Signed)
Pre visit review using our clinic review tool, if applicable. No additional management support is needed unless otherwise documented below in the visit note. 

## 2013-08-22 NOTE — Telephone Encounter (Signed)
Pt transferred from the front desk. Pt was Yelling, she is still having 10-12 episodes of diarrhea a day.  She states she is continuing to have abd pain.  Pt is wanting to be seen in the office again since symptoms are worsening.  I attempted to suggest the pt be seen in the ER since she says her symptoms are worsening but she refused.  She stated she wants to be seen in the office.  I then scheduled her an appointment with Raquel this afternoon at 3:30pm meanwhile I called [facility] to schedule pt.  Pt already had appointment scheduled on 8.13.15 @ 8am.

## 2013-08-25 ENCOUNTER — Encounter: Payer: Self-pay | Admitting: Internal Medicine

## 2013-08-30 ENCOUNTER — Ambulatory Visit: Payer: Commercial Managed Care - HMO | Admitting: Internal Medicine

## 2013-08-31 ENCOUNTER — Telehealth: Payer: Self-pay | Admitting: Internal Medicine

## 2013-08-31 NOTE — Telephone Encounter (Signed)
Patient called in would like to have a referral placed for [facility] for left shoulder pain she states that the physical therapist recommended them to her. She has [employer] please advise. I did tell patient how the referral process was so she would know that it takes [place] to get the referral from the provider and then we would have to contact [facility] and get it approved by them before we would be able to get her an appointment. She also wanted to say she is sorry for missing her appointment from yesterday she had wrote it down wrong. She also asked for a referral to see Dr. Rockey Situ for her b/p. I have already received the auth from [facility] for that referral. I will call them and make an appointment.

## 2013-08-31 NOTE — Telephone Encounter (Signed)
Patient scheduled 09/04/13 at 4.15 FYI

## 2013-08-31 NOTE — Telephone Encounter (Signed)
Pt called in and stated would like to be seen asap. Stated she missed her appt on the 6th and has a lot of problems and needs to be seen.

## 2013-09-02 ENCOUNTER — Other Ambulatory Visit: Payer: Self-pay | Admitting: Internal Medicine

## 2013-09-03 NOTE — Telephone Encounter (Signed)
We will need to document in a visit the reason for the referral. We are moving her appointment to this Friday at 1pm. I will evaluate her shoulder then and place order for referral.

## 2013-09-03 NOTE — Telephone Encounter (Signed)
Are you refilling this? Dose different from med list

## 2013-09-03 NOTE — Telephone Encounter (Signed)
Please see request below

## 2013-09-04 ENCOUNTER — Ambulatory Visit: Payer: Commercial Managed Care - HMO | Admitting: Internal Medicine

## 2013-09-07 ENCOUNTER — Encounter: Payer: Self-pay | Admitting: Internal Medicine

## 2013-09-07 ENCOUNTER — Ambulatory Visit (INDEPENDENT_AMBULATORY_CARE_PROVIDER_SITE_OTHER): Payer: Commercial Managed Care - HMO | Admitting: Internal Medicine

## 2013-09-07 VITALS — BP 130/70 | HR 69 | Temp 98.6°F | Ht 63.5 in | Wt 148.0 lb

## 2013-09-07 DIAGNOSIS — M25511 Pain in right shoulder: Secondary | ICD-10-CM

## 2013-09-07 DIAGNOSIS — E119 Type 2 diabetes mellitus without complications: Secondary | ICD-10-CM | POA: Insufficient documentation

## 2013-09-07 DIAGNOSIS — R197 Diarrhea, unspecified: Secondary | ICD-10-CM

## 2013-09-07 DIAGNOSIS — E063 Autoimmune thyroiditis: Secondary | ICD-10-CM

## 2013-09-07 DIAGNOSIS — I10 Essential (primary) hypertension: Secondary | ICD-10-CM

## 2013-09-07 DIAGNOSIS — M25519 Pain in unspecified shoulder: Secondary | ICD-10-CM

## 2013-09-07 NOTE — Assessment & Plan Note (Signed)
Thyroid function low-normal last check. Will recheck today.

## 2013-09-07 NOTE — Assessment & Plan Note (Signed)
Persistent right shoulder pain despite PT. Will set up ortho evaluation.

## 2013-09-07 NOTE — Progress Notes (Signed)
Subjective:    Patient ID: Yvonne Davidson, female    DOB: 11-27-1940, 73 y.o.   MRN: [id number]  HPI 73YO female presents for follow up.  DM - BG well controlled 80s-130s mostly. Started on Metformin, tolerating relatively well.  Diarrhea - Frequent watery stools. At times "explosive." Improved recently. Seen by GI. Scheduled for colonoscopy. No recent abdominal pain, fever, chills. Normal appetite.  Right shoulder pain - Persistent despite doing PT. Not taking anything for pain.  Review of Systems  Constitutional: Positive for fatigue. Negative for fever, chills, appetite change and unexpected weight change.  Eyes: Negative for visual disturbance.  Respiratory: Negative for shortness of breath.   Cardiovascular: Negative for chest pain and leg swelling.  Gastrointestinal: Positive for diarrhea. Negative for nausea, vomiting, abdominal pain, constipation and blood in stool.  Musculoskeletal: Positive for arthralgias and myalgias.  Skin: Negative for color change and rash.  Hematological: Negative for adenopathy. Does not bruise/bleed easily.  Psychiatric/Behavioral: Positive for sleep disturbance. Negative for dysphoric mood. The patient is not nervous/anxious.        Objective:    BP 130/70  Pulse 69  Temp(Src) 98.6 F (37 C) (Oral)  Ht 5' 3.5" (1.613 m)  Wt 148 lb (67.132 kg)  BMI 25.80 kg/m2  SpO2 96% Physical Exam  Constitutional: She is oriented to person, place, and time. She appears well-developed and well-nourished. No distress.  HENT:  Head: Normocephalic and atraumatic.  Right Ear: External ear normal.  Left Ear: External ear normal.  Nose: Nose normal.  Mouth/Throat: Oropharynx is clear and moist. No oropharyngeal exudate.  Eyes: Conjunctivae are normal. Pupils are equal, round, and reactive to light. Right eye exhibits no discharge. Left eye exhibits no discharge. No scleral icterus.  Neck: Normal range of motion. Neck supple. No tracheal deviation  present. No thyromegaly present.  Cardiovascular: Normal rate, regular rhythm, normal heart sounds and intact distal pulses.  Exam reveals no gallop and no friction rub.   No murmur heard. Pulmonary/Chest: Effort normal and breath sounds normal. No accessory muscle usage. Not tachypneic. No respiratory distress. She has no decreased breath sounds. She has no wheezes. She has no rhonchi. She has no rales. She exhibits no tenderness.  Abdominal: Normal appearance and bowel sounds are normal. There is no tenderness.  Musculoskeletal: Normal range of motion. She exhibits no edema and no tenderness.       Right shoulder: She exhibits pain. She exhibits normal range of motion and normal strength.  Lymphadenopathy:    She has no cervical adenopathy.  Neurological: She is alert and oriented to person, place, and time. No cranial nerve deficit. She exhibits normal muscle tone. Coordination normal.  Skin: Skin is warm and dry. No rash noted. She is not diaphoretic. No erythema. No pallor.  Psychiatric: She has a normal mood and affect. Her behavior is normal. Judgment and thought content normal.          Assessment & Plan:   Problem List Items Addressed This Visit     Unprioritized   Diarrhea     Recent episodes of watery diarrhea. Colonoscopy pending.    Hashimoto's disease     Thyroid function low-normal last check. Will recheck today.     Relevant Orders      TSH      T4, free   Hypertension      BP Readings from Last 3 Encounters:  09/07/13 130/70  08/22/13 136/78  08/16/13 125/71   BP well controlled on  current medications. Will continue    Right shoulder pain     Persistent right shoulder pain despite PT. Will set up ortho evaluation.    Relevant Orders      Ambulatory referral to Orthopedic Surgery   Type II or unspecified type diabetes mellitus without mention of complication, not stated as uncontrolled - Primary     BG control excellent. Will plan recheck A1c in  09/2013.        Return in about 4 weeks (around 10/05/2013) for Recheck.

## 2013-09-07 NOTE — Assessment & Plan Note (Signed)
BP Readings from Last 3 Encounters:  09/07/13 130/70  08/22/13 136/78  08/16/13 125/71   BP well controlled on current medications. Will continue

## 2013-09-07 NOTE — Assessment & Plan Note (Signed)
Recent episodes of watery diarrhea. Colonoscopy pending.

## 2013-09-07 NOTE — Assessment & Plan Note (Signed)
BG control excellent. Will plan recheck A1c in 09/2013.

## 2013-09-07 NOTE — Patient Instructions (Signed)
We will set up an evaluation with orthopedics.  Labs today.  Follow up in 4 weeks and as needed.

## 2013-09-07 NOTE — Progress Notes (Signed)
Pre visit review using our clinic review tool, if applicable. No additional management support is needed unless otherwise documented below in the visit note. 

## 2013-09-10 ENCOUNTER — Ambulatory Visit (INDEPENDENT_AMBULATORY_CARE_PROVIDER_SITE_OTHER): Payer: Commercial Managed Care - HMO | Admitting: Cardiovascular Disease

## 2013-09-10 ENCOUNTER — Encounter: Payer: Self-pay | Admitting: Cardiovascular Disease

## 2013-09-10 ENCOUNTER — Other Ambulatory Visit (INDEPENDENT_AMBULATORY_CARE_PROVIDER_SITE_OTHER): Payer: Commercial Managed Care - HMO

## 2013-09-10 ENCOUNTER — Ambulatory Visit: Payer: Commercial Managed Care - HMO | Admitting: Internal Medicine

## 2013-09-10 VITALS — BP 118/72 | HR 61 | Ht 63.0 in | Wt 146.5 lb

## 2013-09-10 DIAGNOSIS — I158 Other secondary hypertension: Secondary | ICD-10-CM

## 2013-09-10 DIAGNOSIS — E785 Hyperlipidemia, unspecified: Secondary | ICD-10-CM

## 2013-09-10 DIAGNOSIS — E119 Type 2 diabetes mellitus without complications: Secondary | ICD-10-CM

## 2013-09-10 DIAGNOSIS — G4730 Sleep apnea, unspecified: Secondary | ICD-10-CM

## 2013-09-10 DIAGNOSIS — L659 Nonscarring hair loss, unspecified: Secondary | ICD-10-CM

## 2013-09-10 DIAGNOSIS — I159 Secondary hypertension, unspecified: Secondary | ICD-10-CM

## 2013-09-10 DIAGNOSIS — F4323 Adjustment disorder with mixed anxiety and depressed mood: Secondary | ICD-10-CM

## 2013-09-10 LAB — TSH: TSH: 4.6 u[IU]/mL — ABNORMAL HIGH (ref 0.35–4.50)

## 2013-09-10 LAB — HM DIABETES EYE EXAM

## 2013-09-10 LAB — T4, FREE: Free T4: 0.75 ng/dL (ref 0.60–1.60)

## 2013-09-10 NOTE — Patient Instructions (Signed)
You are doing well. No medication changes were made.  Please call us if you have new issues that need to be addressed before your next appt.  Your physician wants you to follow-up in: 12 months.  You will receive a reminder letter in the mail two months in advance. If you don't receive a letter, please call our office to schedule the follow-up appointment. 

## 2013-09-10 NOTE — Assessment & Plan Note (Signed)
Etiology is unclear. No medication changes made at this time. Recommended she consider talking with dermatology

## 2013-09-10 NOTE — Progress Notes (Signed)
Patient ID: Yvonne Davidson, female    DOB: 09/10/1940, 73 y.o.   MRN: [id number]  HPI Comments: Yvonne Davidson is a 73 year old woman with a history of hypertension, obstructive sleep apnea who is followed by the sleep Center at [facility], chronic pain in her legs, GERD, hypertension   she takes losartan 100 mg in the morning, lisinopril 40 mg at noon, amlodipine 5 mg with dinner, clonidine one and one half pills before bed. She sets her alarm to wake up 4 hours later to take additional clonidine at 3 in the morning .  In followup today, she reports that her blood pressure is running well, typically 403 systolic over 60. She is concerned about hair loss is willing to stay on the medications. She is concerned about thyroid disease but has been told in the past that her thyroid lab work is within normal range She no longer sees endocrinology  4 obstructive sleep apnea, she wears a mouth piece designed by [facility] .  she has chronic aching in her legs. She attributes this to sleep apnea and poor blood supply to her legs  She  retired at the age of 73. She was working in [employer]. She was unable to maintain the pace with such poor sleep on a chronic basis  EKG shows normal sinus rhythm with rate 62 beats a minute with no significant ST or T wave changes    Outpatient Encounter Prescriptions as of 09/10/2013  Medication Sig  . Artificial Tear Ointment (REFRESH LACRI-LUBE) OINT Apply 1 application to eye at bedtime.  . carboxymethylcellulose (REFRESH PLUS) 0.5 % SOLN Place 1 drop into both eyes 3 (three) times daily as needed (for dry eyes).   . cloNIDine (CATAPRES) 0.1 MG tablet Take 1 tablet (0.1 mg total) by mouth 3 (three) times daily.  Marland Kitchen CREON 24000 UNITS CPEP TAKE 1 CAPSULE BY MOUTH EVERY DAY  . lipase/protease/amylase (CREON-12/PANCREASE) 12000 UNITS CPEP capsule Take by mouth.  Marland Kitchen lisinopril (PRINIVIL,ZESTRIL) 40 MG tablet Take 1 tablet (40 mg total) by mouth daily.   Marland Kitchen losartan (COZAAR) 100 MG tablet Take 1 tablet (100 mg total) by mouth every morning.  . metFORMIN (GLUCOPHAGE) 500 MG tablet Take 1 tablet (500 mg total) by mouth 2 (two) times daily with a meal.  . mupirocin ointment (BACTROBAN) 2 % Place 1 application into the nose 2 (two) times daily. Mupirocin 2% ointment in NS #1000 Use 30 cc twice a day (15 cc each nostril)  . omeprazole (PRILOSEC) 20 MG capsule Take 20 mg by mouth 2 (two) times daily.   Marland Kitchen PRESCRIPTION MEDICATION Place 1 Applicatorful into the nose 2 (two) times daily as needed (sodium chloride/mupiricin  compounded medication for prevent sinus infections).  Marland Kitchen Respiratory Therapy Supplies (ONE FLOW SPIROMETER) DEVI Please provide incentive spirometer DX: Asthma    Review of Systems  Constitutional: Negative.   HENT: Negative.   Eyes: Negative.   Respiratory: Negative.   Cardiovascular: Negative.   Gastrointestinal: Negative.   Endocrine: Negative.   Musculoskeletal: Negative.   Skin: Negative.   Allergic/Immunologic: Negative.   Neurological: Negative.   Hematological: Negative.   Psychiatric/Behavioral: Positive for sleep disturbance. The patient is nervous/anxious.   All other systems reviewed and are negative.   BP 118/72  Pulse 61  Ht 5\' 3"  (1.6 m)  Wt 146 lb 8 oz (66.452 kg)  BMI 25.96 kg/m2   Physical Exam  Nursing note and vitals reviewed. Constitutional: She is oriented to person, place,  and time. She appears well-developed and well-nourished.  HENT:  Head: Normocephalic.  Nose: Nose normal.  Mouth/Throat: Oropharynx is clear and moist.  Eyes: Conjunctivae are normal. Pupils are equal, round, and reactive to light.  Neck: Normal range of motion. Neck supple. No JVD present.  Cardiovascular: Normal rate, regular rhythm, S1 normal, S2 normal, normal heart sounds and intact distal pulses.  Exam reveals no gallop and no friction rub.   No murmur heard. Pulmonary/Chest: Effort normal and breath sounds normal.  No respiratory distress. She has no wheezes. She has no rales. She exhibits no tenderness.  Abdominal: Soft. Bowel sounds are normal. She exhibits no distension. There is no tenderness.  Musculoskeletal: Normal range of motion. She exhibits no edema and no tenderness.  Lymphadenopathy:    She has no cervical adenopathy.  Neurological: She is alert and oriented to person, place, and time. Coordination normal.  Skin: Skin is warm and dry. No rash noted. No erythema.  Psychiatric: She has a normal mood and affect. Her behavior is normal. Judgment and thought content normal.    Assessment and Plan

## 2013-09-10 NOTE — Assessment & Plan Note (Signed)
We have encouraged continued exercise, careful diet management in an effort to lose weight. 

## 2013-09-10 NOTE — Assessment & Plan Note (Addendum)
Blood pressure is well controlled on today's visit. No changes made to the medications. 

## 2013-09-10 NOTE — Assessment & Plan Note (Signed)
She's not interested in any medications for cholesterol at this time

## 2013-09-10 NOTE — Assessment & Plan Note (Signed)
Sleep apnea improved with her dental guard

## 2013-09-10 NOTE — Assessment & Plan Note (Signed)
She is very anxious about various issues. Tried to reassure her that overall she is doing well

## 2013-10-11 ENCOUNTER — Ambulatory Visit (INDEPENDENT_AMBULATORY_CARE_PROVIDER_SITE_OTHER): Payer: Commercial Managed Care - HMO | Admitting: Internal Medicine

## 2013-10-11 ENCOUNTER — Encounter: Payer: Self-pay | Admitting: Internal Medicine

## 2013-10-11 VITALS — BP 138/80 | HR 61 | Temp 97.9°F | Ht 63.5 in | Wt 146.5 lb

## 2013-10-11 DIAGNOSIS — G4733 Obstructive sleep apnea (adult) (pediatric): Secondary | ICD-10-CM | POA: Insufficient documentation

## 2013-10-11 DIAGNOSIS — E785 Hyperlipidemia, unspecified: Secondary | ICD-10-CM

## 2013-10-11 DIAGNOSIS — Z9989 Dependence on other enabling machines and devices: Secondary | ICD-10-CM

## 2013-10-11 DIAGNOSIS — E119 Type 2 diabetes mellitus without complications: Secondary | ICD-10-CM

## 2013-10-11 DIAGNOSIS — I159 Secondary hypertension, unspecified: Secondary | ICD-10-CM

## 2013-10-11 DIAGNOSIS — I158 Other secondary hypertension: Secondary | ICD-10-CM

## 2013-10-11 LAB — COMPREHENSIVE METABOLIC PANEL
ALK PHOS: 62 U/L (ref 39–117)
ALT: 21 U/L (ref 0–35)
AST: 22 U/L (ref 0–37)
Albumin: 4 g/dL (ref 3.5–5.2)
BILIRUBIN TOTAL: 0.4 mg/dL (ref 0.2–1.2)
BUN: 19 mg/dL (ref 6–23)
CO2: 24 mEq/L (ref 19–32)
CREATININE: 0.9 mg/dL (ref 0.4–1.2)
Calcium: 8.7 mg/dL (ref 8.4–10.5)
Chloride: 105 mEq/L (ref 96–112)
GFR: 67.78 mL/min (ref >60.00)
GLUCOSE: 118 mg/dL — AB (ref 70–99)
Potassium: 4.2 mEq/L (ref 3.5–5.1)
Sodium: 141 mEq/L (ref 135–145)
Total Protein: 7.2 g/dL (ref 6.0–8.3)

## 2013-10-11 LAB — LIPID PANEL
CHOLESTEROL: 263 mg/dL — AB (ref 0–200)
HDL: 44 mg/dL (ref >39.00)
LDL Cholesterol: 196 mg/dL — ABNORMAL HIGH (ref 0–99)
NonHDL: 219
TRIGLYCERIDES: 116 mg/dL (ref 0.0–149.0)
Total CHOL/HDL Ratio: 6
VLDL: 23.2 mg/dL (ref 0.0–40.0)

## 2013-10-11 LAB — HM DIABETES FOOT EXAM: HM Diabetic Foot Exam: NORMAL

## 2013-10-11 LAB — MICROALBUMIN / CREATININE URINE RATIO
CREATININE, U: 113.6 mg/dL
Microalb Creat Ratio: 0.4 mg/g (ref 0.0–30.0)
Microalb, Ur: 0.4 mg/dL (ref 0.0–1.9)

## 2013-10-11 LAB — HEMOGLOBIN A1C: Hgb A1c MFr Bld: 6.6 % — ABNORMAL HIGH (ref 4.6–6.5)

## 2013-10-11 NOTE — Progress Notes (Signed)
Subjective:    Patient ID: Yvonne Davidson, female    DOB: 15-Jul-1940, 73 y.o.   MRN: [id number]  HPI 73YO female presents for follow up.  DM - BG running near 130. Following healthy diet. Compliant with meds.  Continues to have trouble with sleep apnea. Feels that CPAP is not functioning well. Talked to Huey Romans, but report shows device is working.  Feels that OSA has led to her diabetes. Diarrhea has resolved with changing diet and eliminating sugars.  Review of Systems  Constitutional: Positive for fatigue. Negative for fever, chills, appetite change and unexpected weight change.  Eyes: Negative for visual disturbance.  Respiratory: Negative for cough and shortness of breath.   Cardiovascular: Negative for chest pain and leg swelling.  Gastrointestinal: Negative for nausea, vomiting, abdominal pain, diarrhea, constipation and blood in stool.  Musculoskeletal: Negative for arthralgias and myalgias.  Skin: Negative for color change and rash.  Hematological: Negative for adenopathy. Does not bruise/bleed easily.  Psychiatric/Behavioral: Positive for sleep disturbance. Negative for dysphoric mood. The patient is not nervous/anxious.        Objective:    BP 138/80  Pulse 61  Temp(Src) 97.9 F (36.6 C) (Oral)  Ht 5' 3.5" (1.613 m)  Wt 146 lb 8 oz (66.452 kg)  BMI 25.54 kg/m2  SpO2 98% Physical Exam  Constitutional: She is oriented to person, place, and time. She appears well-developed and well-nourished. No distress.  HENT:  Head: Normocephalic and atraumatic.  Right Ear: External ear normal.  Left Ear: External ear normal.  Nose: Nose normal.  Mouth/Throat: Oropharynx is clear and moist. No oropharyngeal exudate.  Eyes: Conjunctivae are normal. Pupils are equal, round, and reactive to light. Right eye exhibits no discharge. Left eye exhibits no discharge. No scleral icterus.  Neck: Normal range of motion. Neck supple. No tracheal deviation present. No thyromegaly  present.  Cardiovascular: Normal rate, regular rhythm, normal heart sounds and intact distal pulses.  Exam reveals no gallop and no friction rub.   No murmur heard. Pulmonary/Chest: Effort normal and breath sounds normal. No accessory muscle usage. Not tachypneic. No respiratory distress. She has no decreased breath sounds. She has no wheezes. She has no rhonchi. She has no rales. She exhibits no tenderness.  Musculoskeletal: Normal range of motion. She exhibits no edema and no tenderness.  Lymphadenopathy:    She has no cervical adenopathy.  Neurological: She is alert and oriented to person, place, and time. No cranial nerve deficit. She exhibits normal muscle tone. Coordination normal.  Skin: Skin is warm and dry. No rash noted. She is not diaphoretic. No erythema. No pallor.  Psychiatric: Her behavior is normal. Judgment and thought content normal. Her mood appears anxious. Her speech is rapid and/or pressured.          Assessment & Plan:   Problem List Items Addressed This Visit     Unprioritized   Hyperlipidemia     Will check lipids with labs today.    Hypertension      BP Readings from Last 3 Encounters:  10/11/13 138/80  09/10/13 118/72  09/07/13 130/70   BP well controlled on current medications. Will continue.    Obstructive sleep apnea     Will set up referral for speech therapy to help with oral device for CPAP.    Relevant Orders      Ambulatory referral to Speech Therapy   Type II or unspecified type diabetes mellitus without mention of complication, not stated as uncontrolled - Primary  Will check A1c with labs today. Continue current medications. Encouraged healthy diet. Foot exam normal today.    Relevant Orders      Comprehensive metabolic panel      Hemoglobin A1c      Lipid panel      Microalbumin / creatinine urine ratio       Return in about 3 months (around 01/10/2014) for Recheck of Diabetes.

## 2013-10-11 NOTE — Assessment & Plan Note (Signed)
BP Readings from Last 3 Encounters:  10/11/13 138/80  09/10/13 118/72  09/07/13 130/70   BP well controlled on current medications. Will continue.

## 2013-10-11 NOTE — Assessment & Plan Note (Signed)
Will check A1c with labs today. Continue current medications. Encouraged healthy diet. Foot exam normal today.

## 2013-10-11 NOTE — Progress Notes (Signed)
Pre visit review using our clinic review tool, if applicable. No additional management support is needed unless otherwise documented below in the visit note. 

## 2013-10-11 NOTE — Assessment & Plan Note (Signed)
Will check lipids with labs today.  

## 2013-10-11 NOTE — Assessment & Plan Note (Signed)
Will set up referral for speech therapy to help with oral device for CPAP.

## 2013-10-11 NOTE — Patient Instructions (Signed)
Labs today.  Follow up in 3 months or sooner as needed. 

## 2013-10-29 ENCOUNTER — Encounter: Payer: Self-pay | Admitting: Internal Medicine

## 2013-10-29 ENCOUNTER — Ambulatory Visit (INDEPENDENT_AMBULATORY_CARE_PROVIDER_SITE_OTHER): Payer: Commercial Managed Care - HMO | Admitting: Internal Medicine

## 2013-10-29 VITALS — BP 118/62 | HR 62 | Temp 98.0°F | Wt 144.0 lb

## 2013-10-29 DIAGNOSIS — B070 Plantar wart: Secondary | ICD-10-CM

## 2013-10-29 NOTE — Patient Instructions (Addendum)
Plantar Warts Warts are benign (noncancerous) growths of the outer skin layer. They can occur at any time in life but are most common during childhood and the teen years. Warts can occur on many skin surfaces of the body. When they occur on the underside (sole) of your foot they are called plantar warts. They often emerge in groups with several small warts encircling a larger growth. CAUSES  Human papillomavirus (HPV) is the cause of plantar warts. HPV attacks a break in the skin of the foot. Walking barefoot can lead to exposure to the wart virus. Plantar warts tend to develop over areas of pressure such as the heel and ball of the foot. Plantar warts often grow into the deeper layers of skin. They may spread to other areas of the sole but cannot spread to other areas of the body. SYMPTOMS  You may also notice a growth on the undersurface of your foot. The wart may grow directly into the sole of the foot, or rise above the surface of the skin on the sole of the foot, or both. They are most often flat from pressure. Warts generally do not cause itching but may cause pain in the area of the wart when you put weight on your foot. DIAGNOSIS  Diagnosis is made by physical examination. This means your caregiver discovers it while examining your foot.  TREATMENT  There are many ways to treat plantar warts. However, warts are very tough. Sometimes it is difficult to treat them so that they go away completely and do not grow back. Any treatment must be done regularly to work. If left untreated, most plantar warts will eventually disappear over a period of one to two years. Treatments you can do at home include:  Putting duct tape over the top of the wart (occlusion) has been found to be effective over several months. The duct tape should be removed each night and reapplied until the wart has disappeared.  Placing over-the-counter medications on top of the wart to help kill the wart virus and remove the wart  tissue (salicylic acid, cantharidin, and dichloroacetic acid) are useful. These are called keratolytic agents. These medications make the skin soft and gradually layers will shed away. These compounds are usually placed on the wart each night and then covered with a bandage. They are also available in premedicated bandage form. Avoid surrounding skin when applying these liquids as these medications can burn healthy skin. The treatment may take several months of nightly use to be effective.  Cryotherapy to freeze the wart has recently become available over-the-counter for children 4 years and older. This system makes use of a soft narrow applicator connected to a bottle of compressed cold liquid that is applied directly to the wart. This medication can burn healthy skin and should be used with caution.  As with all over-the-counter medications, read the directions carefully before use. Treatments generally done in your caregiver's office include:  Some aggressive treatments may cause discomfort, discoloration, and scarring of the surrounding skin. The risks and benefits of treatment should be discussed with your caregiver.  Freezing the wart with liquid nitrogen (cryotherapy, see above).  Burning the wart with use of very high heat (cautery).  Injecting medication into the wart.  Surgically removing or laser treatment of the wart.  Your caregiver may refer you to a dermatologist for difficult to treat large-sized warts or large numbers of warts. HOME CARE INSTRUCTIONS   Soak the affected area in warm water. Dry the   area completely when you are done. Remove the top layer of softened skin, then apply the chosen topical medication and reapply a bandage.  Remove the bandage daily and file excess wart tissue (pumice stone works well for this purpose). Repeat the entire process daily or every other day for weeks until the plantar wart disappears.  Several brands of salicylic acid pads are available  as over-the-counter remedies.  Pain can be relieved by wearing a donut bandage. This is a bandage with a hole in it. The bandage is put on with the hole over the wart. This helps take the pressure off the wart and gives pain relief. To help prevent plantar warts:  Wear shoes and socks and change them daily.  Keep feet clean and dry.  Check your feet and your children's feet regularly.  Avoid direct contact with warts on other people.  Have growths or changes on your skin checked by your caregiver. Document Released: 04/03/2003 Document Revised: 05/28/2013 Document Reviewed: 09/11/2008 [employer] Patient Information 2015 [employer]. This information is not intended to replace advice given to you by your health care provider. Make sure you discuss any questions you have with your health care provider.  

## 2013-10-29 NOTE — Progress Notes (Signed)
Pre visit review using our clinic review tool, if applicable. No additional management support is needed unless otherwise documented below in the visit note. 

## 2013-10-29 NOTE — Progress Notes (Signed)
Subjective:    Patient ID: Yvonne Davidson, female    DOB: 12-05-1940, 73 y.o.   MRN: [id number]  HPI  Pt presents to the clinic today with c/o a sore spot on her left foot. She reports this started 4-5 days ago. She can feel a knot under her big toe on her left foot. She thinks she may have something in her foot. It is painful when she puts pressure on her foot. She denies any specific injury to the area. She was recently told that she was diabetic.  Review of Systems      Past Medical History  Diagnosis Date  . Chronic sinusitis     takes Cetirizine daily  . Hashimoto's thyroiditis   . Allergy   . Chronic bronchitis     last time 2 yrs ago  . Arthritis   . Heart murmur   . Hyperlipidemia     doesn't take any meds for this  . Osteoporosis     takes Vit D as needed  . PONV (postoperative nausea and vomiting)     hard to wake up  . Hypertension     takes Losartan and Clonidine nightly  . OSA (obstructive sleep apnea)     doesn't use a cpap  . Gastritis   . H/O hiatal hernia   . Diverticulosis   . Urinary frequency   . Anemia   . History of blood transfusion     in the 60's no abnormal reaction noted  . Insomnia     takes Trazodone nightly  . Diabetes mellitus without complication     borderline  . Bilateral cataracts     immature  . Dry eyes   . Anxiety   . Depression     doesn't take any meds  . GERD (gastroesophageal reflux disease)     TAKES OMEPRAZOLE DAILY    Current Outpatient Prescriptions  Medication Sig Dispense Refill  . Artificial Tear Ointment (REFRESH LACRI-LUBE) OINT Apply 1 application to eye at bedtime.      . carboxymethylcellulose (REFRESH PLUS) 0.5 % SOLN Place 1 drop into both eyes 3 (three) times daily as needed (for dry eyes).       . cloNIDine (CATAPRES) 0.1 MG tablet Take 1 tablet (0.1 mg total) by mouth 3 (three) times daily.  90 tablet  11  . CREON 24000 UNITS CPEP TAKE 1 CAPSULE BY MOUTH EVERY DAY  180 capsule  0  .  lipase/protease/amylase (CREON-12/PANCREASE) 12000 UNITS CPEP capsule Take by mouth.      Marland Kitchen lisinopril (PRINIVIL,ZESTRIL) 40 MG tablet Take 1 tablet (40 mg total) by mouth daily.  90 tablet  3  . losartan (COZAAR) 100 MG tablet Take 1 tablet (100 mg total) by mouth every morning.  90 tablet  3  . metFORMIN (GLUCOPHAGE) 500 MG tablet Take 1 tablet (500 mg total) by mouth 2 (two) times daily with a meal.  60 tablet  3  . mupirocin ointment (BACTROBAN) 2 % Place 1 application into the nose 2 (two) times daily. Mupirocin 2% ointment in NS #1000 Use 30 cc twice a day (15 cc each nostril)  30 g  4  . omeprazole (PRILOSEC) 20 MG capsule Take 20 mg by mouth 2 (two) times daily.       Marland Kitchen PRESCRIPTION MEDICATION Place 1 Applicatorful into the nose 2 (two) times daily as needed (sodium chloride/mupiricin  compounded medication for prevent sinus infections).       No current facility-administered  medications for this visit.    Allergies  Allergen Reactions  . Codeine Shortness Of Breath  . Mold Extract [Trichophyton Mentagrophyte] Shortness Of Breath  . Morphine And Related Anaphylaxis    "Cant breath when I take it"  . Oxycodone Hcl Anaphylaxis    ALL NARCOTICS  . Sumycin [Tetracycline Hcl] Anaphylaxis  . Decongest-Aid [Pseudoephedrine]   . Esomeprazole Magnesium Other (See Comments)    Stomach issues  . Hydrochlorothiazide Other (See Comments)    pancreatitis  . Indomethacin Other (See Comments)    disorientation  . Procaine Hcl Other (See Comments)    TACHYCARDIA   . Keflex [Cephalexin] Rash    Unknown, allergic to a lot of antibiotics  . Latex Rash    Breaks out  . Neomycin-Bacitracin Zn-Polymyx Rash  . Pseudoephedrine Hcl Er Palpitations  . Telithromycin Rash  . Tetracycline Rash    Family History  Problem Relation Age of Onset  . Arthritis Mother   . Heart disease Mother   . Hyperlipidemia Mother   . Hypertension Mother   . Stroke Mother   . Cancer Mother     Breast & Uterine  - 20'-30's  . Early death Father     Fire  . Alcohol abuse Father     History   Social History  . Marital Status: Single    Spouse Name: N/A    Number of Children: 4  . Years of Education: N/A   Occupational History  . EM [employer]    Social History Main Topics  . Smoking status: Former Smoker -- 0.50 packs/day for 18 years    Types: Cigarettes  . Smokeless tobacco: Never Used     Comment: quit in 1978  . Alcohol Use: Yes     Comment: occassionally beer or wine  . Drug Use: No  . Sexual Activity: Not on file   Other Topics Concern  . Not on file   Social History Narrative   Lives with friend. Has 4 children.     Constitutional: Denies fever, malaise, fatigue, headache or abrupt weight changes.  Musculoskeletal: Pt reports a sore spot on the bottom of her foot. Denies decrease in range of motion, muscle pain or joint pain and swelling.  Skin: Denies redness, rashes, lesions or ulcercations.    No other specific complaints in a complete review of systems (except as listed in HPI above).  Objective:   Physical Exam   BP 118/62  Pulse 62  Temp(Src) 98 F (36.7 C) (Oral)  Wt 144 lb (65.318 kg)  SpO2 98% Wt Readings from Last 3 Encounters:  10/29/13 144 lb (65.318 kg)  10/11/13 146 lb 8 oz (66.452 kg)  09/10/13 146 lb 8 oz (66.452 kg)    General: Appears her stated age, well developed, well nourished in NAD. Skin: Warm, dry and intact. Plantar wart noted at the base of the left great toe, < 1 cm Cardiovascular: Normal rate and rhythm. S1,S2 noted.  No murmur, rubs or gallops noted. No JVD or BLE edema. No carotid bruits noted. Pulmonary/Chest: Normal effort and positive vesicular breath sounds. No respiratory distress. No wheezes, rales or ronchi noted.  Musculoskeletal:  No difficulty with gait. Normal flexion and extension of the left great toe.   BMET    Component Value Date/Time   NA 141 10/11/2013 1002   K 4.2 10/11/2013 1002   CL 105  10/11/2013 1002   CO2 24 10/11/2013 1002   GLUCOSE 118* 10/11/2013 1002  BUN 19 10/11/2013 1002   CREATININE 0.9 10/11/2013 1002   CALCIUM 8.7 10/11/2013 1002   GFRNONAA 85* 12/12/2012 0525   GFRAA >90 12/12/2012 0525    Lipid Panel     Component Value Date/Time   CHOL 263* 10/11/2013 1002   TRIG 116.0 10/11/2013 1002   HDL 44.00 10/11/2013 1002   CHOLHDL 6 10/11/2013 1002   VLDL 23.2 10/11/2013 1002   LDLCALC 196* 10/11/2013 1002    CBC    Component Value Date/Time   WBC 8.1 08/16/2013 1151   RBC 4.54 08/16/2013 1151   HGB 13.6 08/16/2013 1151   HCT 40.9 08/16/2013 1151   PLT 285.0 08/16/2013 1151   MCV 90.2 08/16/2013 1151   MCH 30.7 12/12/2012 0525   MCHC 33.2 08/16/2013 1151   RDW 14.4 08/16/2013 1151   LYMPHSABS 2.1 08/16/2013 1151   MONOABS 0.7 08/16/2013 1151   EOSABS 0.2 08/16/2013 1151   BASOSABS 0.0 08/16/2013 1151    Hgb A1C Lab Results  Component Value Date   HGBA1C 6.6* 10/11/2013         Assessment & Plan:   Plantar wart:  Too deep to freeze I would try soaking in epsom salt Get a cushioned pad to put around it- can be bought at [facility] If no improvement, you may need to see a podiatrist to have it excised  RTC as needed or if symptoms persist or worsen

## 2013-11-15 ENCOUNTER — Other Ambulatory Visit: Payer: Self-pay

## 2013-11-15 ENCOUNTER — Ambulatory Visit: Payer: Commercial Managed Care - HMO | Admitting: Internal Medicine

## 2013-11-15 ENCOUNTER — Ambulatory Visit (INDEPENDENT_AMBULATORY_CARE_PROVIDER_SITE_OTHER): Payer: Commercial Managed Care - HMO | Admitting: Internal Medicine

## 2013-11-15 ENCOUNTER — Encounter: Payer: Self-pay | Admitting: Internal Medicine

## 2013-11-15 VITALS — BP 130/70 | HR 63 | Temp 98.1°F | Ht 63.5 in | Wt 147.5 lb

## 2013-11-15 DIAGNOSIS — K219 Gastro-esophageal reflux disease without esophagitis: Secondary | ICD-10-CM

## 2013-11-15 MED ORDER — SUCRALFATE 1 G PO TABS: 1.0000 g | ORAL_TABLET | Freq: Three times a day (TID) | ORAL | Status: DC

## 2013-11-15 MED ORDER — OMEPRAZOLE 20 MG PO CPDR: 20.0000 mg | DELAYED_RELEASE_CAPSULE | Freq: Two times a day (BID) | ORAL | Status: DC

## 2013-11-15 MED ORDER — SUCRALFATE 1 GM/10ML PO SUSP: 1.0000 g | Freq: Three times a day (TID) | ORAL | Status: DC

## 2013-11-15 NOTE — Progress Notes (Signed)
Pre visit review using our clinic review tool, if applicable. No additional management support is needed unless otherwise documented below in the visit note. 

## 2013-11-15 NOTE — Progress Notes (Signed)
Subjective:    Patient ID: Yvonne Davidson, female    DOB: [birth year], 73 y.o.   MRN: [id number]  HPI 73YO female presents for acute visit.  Abdominal pain - States "I have gastritis." Reports she has had this for a long time. Having epigastric burning pain. Has not been taking Prilosec recently. No blood in stool or black stool. No nausea or vomiting. Would like to start back on Carafate. She reports that recent testing for H. Pylori by her GI physician was negative, however this result is not available in the [facility] chart through [facility].   Review of Systems  Constitutional: Negative for fever, chills, appetite change, fatigue and unexpected weight change.  Eyes: Negative for visual disturbance.  Respiratory: Negative for shortness of breath.   Cardiovascular: Negative for chest pain and leg swelling.  Gastrointestinal: Positive for abdominal pain. Negative for nausea, vomiting, diarrhea, constipation, blood in stool, abdominal distention and anal bleeding.  Skin: Negative for color change and rash.  Hematological: Negative for adenopathy. Does not bruise/bleed easily.  Psychiatric/Behavioral: Negative for dysphoric mood. The patient is not nervous/anxious.        Objective:    BP 130/70  Pulse 63  Temp(Src) 98.1 F (36.7 C) (Oral)  Ht 5' 3.5" (1.613 m)  Wt 147 lb 8 oz (66.906 kg)  BMI 25.72 kg/m2  SpO2 97% Physical Exam  Constitutional: She is oriented to person, place, and time. She appears well-developed and well-nourished. No distress.  HENT:  Head: Normocephalic and atraumatic.  Right Ear: External ear normal.  Left Ear: External ear normal.  Nose: Nose normal.  Mouth/Throat: Oropharynx is clear and moist.  Eyes: Conjunctivae are normal. Pupils are equal, round, and reactive to light. Right eye exhibits no discharge. Left eye exhibits no discharge. No scleral icterus.  Neck: Normal range of motion. Neck supple. No tracheal deviation present. No thyromegaly  present.  Cardiovascular: Normal rate, regular rhythm, normal heart sounds and intact distal pulses.  Exam reveals no gallop and no friction rub.   No murmur heard. Pulmonary/Chest: Effort normal and breath sounds normal. No accessory muscle usage. Not tachypneic. No respiratory distress. She has no decreased breath sounds. She has no wheezes. She has no rhonchi. She has no rales. She exhibits no tenderness.  Abdominal: Soft. Bowel sounds are normal. She exhibits no distension and no mass. There is tenderness (epigastric). There is no rebound and no guarding.  Musculoskeletal: Normal range of motion. She exhibits no edema and no tenderness.  Lymphadenopathy:    She has no cervical adenopathy.  Neurological: She is alert and oriented to person, place, and time. No cranial nerve deficit. She exhibits normal muscle tone. Coordination normal.  Skin: Skin is warm and dry. No rash noted. She is not diaphoretic. No erythema. No pallor.  Psychiatric: She has a normal mood and affect. Her behavior is normal. Judgment and thought content normal.          Assessment & Plan:   Problem List Items Addressed This Visit     Unprioritized   GERD - Primary     Recent worsening of GERD symptoms. Encouraged pt to restart Omeprazole twice daily and add Carafate four times daily. Follow up in 2 weeks or sooner if symptoms are not improving. Discussed referral to GI if symptoms not improving.     Relevant Medications      CARAFATE 1 GM/10ML PO SUSP      omeprazole (PRILOSEC) capsule       Return  in about 2 weeks (around 11/29/2013).

## 2013-11-15 NOTE — Patient Instructions (Signed)
Start Omeprazole 20mg  twice daily.  Start Carafate four times daily.  Please follow up in 2 weeks or sooner if symptoms are not improving.

## 2013-11-15 NOTE — Assessment & Plan Note (Signed)
Recent worsening of GERD symptoms. Encouraged pt to restart Omeprazole twice daily and add Carafate four times daily. Follow up in 2 weeks or sooner if symptoms are not improving. Discussed referral to GI if symptoms not improving.

## 2013-11-15 NOTE — Telephone Encounter (Signed)
Medication would not send electronically. Medication phoned in.

## 2013-11-19 ENCOUNTER — Telehealth: Payer: Self-pay

## 2013-11-19 NOTE — Telephone Encounter (Signed)
Reviewed.  In Dr Thomes Dinning note, it states H. Pylori recently checked and negative.  Will hold for her to order the tests she feels are needed.  Thanks

## 2013-11-19 NOTE — Telephone Encounter (Signed)
She states she was having stomach pain.  It was explained, thoroughly, to the patient that Dr.Walker is out of the office until Thursday.

## 2013-11-19 NOTE — Telephone Encounter (Signed)
Pt states that she is still having same symptoms from thursdays visit, forgot to ask Dr Gilford Rile to do these test

## 2013-11-19 NOTE — Telephone Encounter (Signed)
Did she state why she wants this ordered?

## 2013-11-19 NOTE — Telephone Encounter (Signed)
The patient is hoping to get labs ordered for h-pylor (spelling?) and her b-12 level checked   Phone- [phone]

## 2013-11-19 NOTE — Telephone Encounter (Signed)
Please see pt request

## 2013-11-19 NOTE — Telephone Encounter (Signed)
Please call pt and see if she is having acute symptoms.  What specifically is going on?  Just states abdominal pain.

## 2013-11-20 NOTE — Telephone Encounter (Signed)
Pt states that she needs to proceed with getting a referral to [facility] to [facility]

## 2013-11-20 NOTE — Telephone Encounter (Signed)
Needs a 45min follow up visit. I don't know who Josephina Gip is.

## 2013-11-21 NOTE — Telephone Encounter (Signed)
Can we just set up a referral to that office?

## 2013-11-21 NOTE — Telephone Encounter (Signed)
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 She has been to this office before, but she had different insurance at that time. I dont see any information in our file from her visits at that office.  Pt states that she already discussed a possible referral at her last appt on 10/22 with you. Still need a follow up appt?

## 2013-11-23 NOTE — Telephone Encounter (Signed)
Yvonne Davidson, Please see below, can we set up a referral to this office?

## 2013-11-26 NOTE — Telephone Encounter (Signed)
Need Dr Gilford Rile to place referral

## 2013-12-14 DIAGNOSIS — A048 Other specified bacterial intestinal infections: Secondary | ICD-10-CM | POA: Insufficient documentation

## 2013-12-14 DIAGNOSIS — K219 Gastro-esophageal reflux disease without esophagitis: Secondary | ICD-10-CM | POA: Insufficient documentation

## 2013-12-24 ENCOUNTER — Other Ambulatory Visit: Payer: Self-pay | Admitting: Internal Medicine

## 2014-01-07 ENCOUNTER — Other Ambulatory Visit: Payer: Self-pay | Admitting: *Deleted

## 2014-01-07 ENCOUNTER — Telehealth: Payer: Self-pay

## 2014-01-07 DIAGNOSIS — E119 Type 2 diabetes mellitus without complications: Secondary | ICD-10-CM

## 2014-01-07 NOTE — Telephone Encounter (Signed)
We can check A1c and lipids for Diabetes Type 2 controlled

## 2014-01-07 NOTE — Telephone Encounter (Signed)
The patient called and stated she is hoping to have her A1C and cholesterol checked.  Do you want her added to the lab schedule?

## 2014-01-15 ENCOUNTER — Other Ambulatory Visit (INDEPENDENT_AMBULATORY_CARE_PROVIDER_SITE_OTHER): Payer: Commercial Managed Care - HMO

## 2014-01-15 DIAGNOSIS — E119 Type 2 diabetes mellitus without complications: Secondary | ICD-10-CM

## 2014-01-15 LAB — LIPID PANEL
Cholesterol: 232 mg/dL — ABNORMAL HIGH (ref 0–200)
HDL: 36.2 mg/dL — ABNORMAL LOW (ref >39.00)
LDL CALC: 156 mg/dL — AB (ref 0–99)
NONHDL: 195.8
Total CHOL/HDL Ratio: 6
Triglycerides: 198 mg/dL — ABNORMAL HIGH (ref 0.0–149.0)
VLDL: 39.6 mg/dL (ref 0.0–40.0)

## 2014-01-15 LAB — HEMOGLOBIN A1C: Hgb A1c MFr Bld: 6.9 % — ABNORMAL HIGH (ref 4.6–6.5)

## 2014-01-30 ENCOUNTER — Telehealth: Payer: Self-pay

## 2014-01-30 NOTE — Telephone Encounter (Signed)
The patient called and is hoping to have her bone density test scheduled.

## 2014-01-31 ENCOUNTER — Telehealth: Payer: Self-pay | Admitting: Internal Medicine

## 2014-01-31 DIAGNOSIS — Z780 Asymptomatic menopausal state: Secondary | ICD-10-CM

## 2014-01-31 NOTE — Telephone Encounter (Signed)
-----   Message from Allegra Lai sent at 01/31/2014 11:25 AM EST ----- Patient called want to be scheduled for Bone Density   She had her last 03/2012  Made her appt for 03/2014 @ [place]  Need order

## 2014-02-01 ENCOUNTER — Other Ambulatory Visit: Payer: Self-pay | Admitting: Internal Medicine

## 2014-02-01 ENCOUNTER — Telehealth: Payer: Self-pay | Admitting: *Deleted

## 2014-02-01 DIAGNOSIS — E119 Type 2 diabetes mellitus without complications: Secondary | ICD-10-CM

## 2014-02-01 MED ORDER — GLUCOSE BLOOD VI STRP: ORAL_STRIP | Status: DC

## 2014-02-01 MED ORDER — ONETOUCH ULTRASOFT LANCETS MISC: Status: DC

## 2014-02-01 MED ORDER — BLOOD GLUCOSE MONITOR SYSTEM W/DEVICE KIT: PACK | Status: DC

## 2014-02-01 NOTE — Telephone Encounter (Signed)
Pt called requesting a Rx for OneTouch Monitor, lancets, and needles for diabetic testing.  Pt states she tests her blood sugar 3-4 times weekly.  Please advise

## 2014-02-01 NOTE — Telephone Encounter (Signed)
Fine to call in this monitor and test strips/lancets to her pharmacy

## 2014-02-01 NOTE — Telephone Encounter (Signed)
Pt left vm stating that pharmacy received Rx for the lancets and testing strips.  However, no Rx for monitor was sent.  Pt was very upset, please send to pharmacy.

## 2014-02-01 NOTE — Telephone Encounter (Signed)
Rx sent 

## 2014-02-01 NOTE — Telephone Encounter (Signed)
Rx sent again

## 2014-02-02 ENCOUNTER — Other Ambulatory Visit: Payer: Self-pay | Admitting: Internal Medicine

## 2014-02-04 ENCOUNTER — Telehealth: Payer: Self-pay | Admitting: *Deleted

## 2014-02-04 NOTE — Telephone Encounter (Signed)
Patient called around 9 a.m., refused to give her name to scheduler, was demanding and irate during approximately 30 minute phone call, and requested resolution to Rx refills from last week and lab results.  Advised patient would research and a clinician will call her back today

## 2014-02-04 NOTE — Telephone Encounter (Signed)
Returned call to patient to verify all items are resolved.  Patient agreed they are.  She stated she was confused about the lancets and strips not being at the pharmacy.  I advised the pharmacy told [facility] their inventory was pending, which is why we gave her the meter, strips and lancets to use until the pharmacy calls her.  Pt stated everything resolved.  Very appreciative of the follow-up.

## 2014-02-04 NOTE — Telephone Encounter (Signed)
Pt had called and spoken to office manager this morning, wanting most recent lab results. Results given to patient from 01/15/14 labs,  verbalized understanding. Discussed with patient that results had been released on mychart and went unread. Discussed if she wanted to continue using mychart since results were being sent this way. States she did, needed to know how to access mychart again, needing new activation code. Placed pt on hold for one minute to discuss with front desk employee on how to get her re-set up. Pt hung up prior to my returning to the call.  Office manager asked [facility] sent [facility]/01/14 to confirm receipt. [facility], [facility] Rxs for testing strips and lancets, received 02/01/14, "pending inventory". Received meter Rx 02/04/14.   Izora Gala, Glass blower/designer, made aware.

## 2014-02-05 ENCOUNTER — Telehealth: Payer: Self-pay | Admitting: Internal Medicine

## 2014-02-05 NOTE — Telephone Encounter (Addendum)
I picked up the phone line and verbalized my name and apologized for the wait .The patient proceeded to inform me that she was being transferred from this person to that person . She also stated that she needed help to work the my chart feature if that is what they wanted her to use to access her medical record and results. She informed me that she can use the computer ,but that feature was not working well for her. I asked if she wanted to be deactivated from my chart and receive her results by phone or mail . She stated whatever would work best.  I have deactivated the patient's account for my chart at her request.

## 2014-02-11 ENCOUNTER — Other Ambulatory Visit: Payer: Self-pay | Admitting: *Deleted

## 2014-02-11 MED ORDER — GLUCOSE BLOOD VI STRP: ORAL_STRIP | Status: DC

## 2014-02-28 ENCOUNTER — Other Ambulatory Visit: Payer: Self-pay

## 2014-02-28 ENCOUNTER — Telehealth: Payer: Self-pay | Admitting: Internal Medicine

## 2014-02-28 ENCOUNTER — Telehealth: Payer: Self-pay

## 2014-02-28 MED ORDER — ONETOUCH DELICA LANCETS 33G MISC: Status: AC

## 2014-02-28 NOTE — Telephone Encounter (Signed)
Patient came into the office upset that she had the wrong lancet. I went to the front desk to see if I could help and I introduced myself to the patient. Patient stated " I know who you are [place] I spoke to you several times and I dont understand why it has taken a month to get this figured out" in a very nasty tone I explain to the patient that I have not spoken to her about this matter but I would be able to help her get the correct lancet. I went to my desk and ordered the right lancet and printed out a copy of the order so patient can keep for her records. I told patient if she had any problems she could call me directly but patient ignored me and continued to tell Lorriane Shire that she believes her and she always tries to help as much as she can. I return to my desk after that to continue working with my patients.

## 2014-02-28 NOTE — Telephone Encounter (Signed)
Pt came in to the clinic today upset and verbally abusive that her pharmacy dispensed the incorrect lancets for her glucose meter.  I verified that we did not have any extras in the clinic to give to the pt.  We did not, while I was doing that [place] sent in another escript to the pharmacy for the correct lancets.  This time the insurance denied as this refill was too early.  Pt returned to the clinic even more upset and verbally abusive, this time when Lorriane Shire came to me I decided to call [facility], I spoke with Richardson Landry the pharmacist/Co owner.  Gave him the situation, he gave me an out of pocket cost of $13.39 and also "ran the Rx" and received the same early refill message. He said he would call the insurance company to get an override. I met with the pt and Lorriane Shire explained to the pt how I was attempting to help her. She became verbally abusive again, stated she had already dealt with the office manager and dealing with her was like "spitting in the wind."  She stated that Lorriane Shire was the only person here that was competent, and that we all should do our jobs with no mistakes.  At that point I excused myself.  Richardson Landry from [facility] called back to report that after speaking with the insurance company he was able to get the correct lancets for the pt at no out of pocket expense.  I asked Lorriane Shire to call the pt to advise her of the outcome.  Pt is aware and is going to [facility] for correct Rx.

## 2014-02-28 NOTE — Telephone Encounter (Signed)
I called the patient after Nitchia informed me that she had spoken with the pharmacist and he was able to get the patient's prescription at no cost. She thanked [place] for our help and she will pick up the prescription.

## 2014-03-07 ENCOUNTER — Encounter: Payer: Self-pay | Admitting: *Deleted

## 2014-03-08 ENCOUNTER — Ambulatory Visit (INDEPENDENT_AMBULATORY_CARE_PROVIDER_SITE_OTHER): Payer: Commercial Managed Care - HMO | Admitting: Cardiovascular Disease

## 2014-03-08 ENCOUNTER — Encounter: Payer: Self-pay | Admitting: Cardiovascular Disease

## 2014-03-08 VITALS — BP 152/68 | HR 86 | Ht 63.0 in | Wt 147.5 lb

## 2014-03-08 DIAGNOSIS — E785 Hyperlipidemia, unspecified: Secondary | ICD-10-CM

## 2014-03-08 DIAGNOSIS — R0989 Other specified symptoms and signs involving the circulatory and respiratory systems: Secondary | ICD-10-CM

## 2014-03-08 DIAGNOSIS — R002 Palpitations: Secondary | ICD-10-CM

## 2014-03-08 DIAGNOSIS — F4323 Adjustment disorder with mixed anxiety and depressed mood: Secondary | ICD-10-CM

## 2014-03-08 DIAGNOSIS — E119 Type 2 diabetes mellitus without complications: Secondary | ICD-10-CM

## 2014-03-08 DIAGNOSIS — I10 Essential (primary) hypertension: Secondary | ICD-10-CM

## 2014-03-08 NOTE — Patient Instructions (Signed)
You are doing well. No medication changes were made.  We will schedule a carotid ultrasound for bruit on the left  Please call us if you have new issues that need to be addressed before your next appt.  Your physician wants you to follow-up in: 6 months.  You will receive a reminder letter in the mail two months in advance. If you don't receive a letter, please call our office to schedule the follow-up appointment.

## 2014-03-08 NOTE — Assessment & Plan Note (Signed)
She'll continue on her current medication regimen. High blood pressures possibly from recent stress at home

## 2014-03-08 NOTE — Assessment & Plan Note (Signed)
We have encouraged continued exercise, careful diet management in an effort to lose weight. 

## 2014-03-08 NOTE — Assessment & Plan Note (Signed)
We have ordered a carotid ultrasound given new carotid bruit on the left

## 2014-03-08 NOTE — Assessment & Plan Note (Signed)
She does not want a statin at this time. We'll order carotid ultrasound. If this documents carotid atherosclerosis, we'll discuss starting a statin

## 2014-03-08 NOTE — Progress Notes (Signed)
Patient ID: Yvonne Davidson, female    DOB: Aug 15, 1940, 74 y.o.   MRN: [id number]  HPI Comments: Yvonne Davidson is a 74 year old woman with a history of hypertension, obstructive sleep apnea who is followed by the sleep Center at [facility], chronic pain in her legs, GERD, hypertension. She presents for follow-up of her blood pressure  Blood pressure has been labile but in general 130 up to 150 on a regular basis. Continues to have significant stress at home. Husband with possible memory issues.  she takes losartan 100 mg in the morning, lisinopril 40 mg at noon, amlodipine 5 mg with dinner, clonidine one and one half pills before bed. She sets her alarm to wake up 4 hours later to take additional clonidine at 3 in the morning . Previously worried about losing hair. Not a concern on today's visit. Total cholesterol 232, hemoglobin A1c 6.9. She does not want a statin  EKG on today's visit shows normal sinus rhythm with rate 86 beats minute, no significant ST or T-wave changes  Other past medical history For obstructive sleep apnea, she wears a mouth piece designed by [facility] dentistry .  she has chronic aching in her legs. She attributes this to sleep apnea and poor blood supply to her legs  She  retired at the age of 74. She was working in [employer]. She was unable to maintain the pace with such poor sleep on a chronic basis    Allergies  Allergen Reactions  . Codeine Shortness Of Breath  . Mold Extract [Trichophyton Mentagrophyte] Shortness Of Breath  . Morphine And Related Anaphylaxis    "Cant breath when I take it"  . Oxycodone Hcl Anaphylaxis    ALL NARCOTICS  . Sumycin [Tetracycline Hcl] Anaphylaxis  . Decongest-Aid [Pseudoephedrine]   . Esomeprazole Magnesium Other (See Comments)    Stomach issues  . Hydrochlorothiazide Other (See Comments)    pancreatitis  . Indomethacin Other (See Comments)    disorientation  . Procaine Hcl Other (See Comments)    TACHYCARDIA    . Keflex [Cephalexin] Rash    Unknown, allergic to a lot of antibiotics  . Latex Rash    Breaks out  . Neomycin-Bacitracin Zn-Polymyx Rash  . Pseudoephedrine Hcl Er Palpitations  . Telithromycin Rash  . Tetracycline Rash    Outpatient Encounter Prescriptions as of 03/08/2014  Medication Sig  . Artificial Tear Ointment (REFRESH LACRI-LUBE) OINT Apply 1 application to eye at bedtime.  . Blood Glucose Monitoring Suppl (ONE TOUCH ULTRA MINI) W/DEVICE KIT USE AS DIRECTED  . carboxymethylcellulose (REFRESH PLUS) 0.5 % SOLN Place 1 drop into both eyes 3 (three) times daily as needed (for dry eyes).   . cloNIDine (CATAPRES) 0.1 MG tablet Take 1 tablet (0.1 mg total) by mouth 3 (three) times daily.  Marland Kitchen CREON 24000 UNITS CPEP TAKE 1 CAPSULE BY MOUTH EVERY DAY  . glucose blood (CHOICE DM FORA G20 TEST STRIPS) test strip Use as instructed  . glucose blood test strip Use as instructed  . lisinopril (PRINIVIL,ZESTRIL) 40 MG tablet TAKE 1 TABLET (40 MG TOTAL) BY MOUTH DAILY.  Marland Kitchen losartan (COZAAR) 100 MG tablet Take 1 tablet (100 mg total) by mouth every morning.  . metFORMIN (GLUCOPHAGE) 500 MG tablet Take 1 tablet (500 mg total) by mouth 2 (two) times daily with a meal.  . mupirocin ointment (BACTROBAN) 2 % Place 1 application into the nose 2 (two) times daily. Mupirocin 2% ointment in NS #1000 Use 30 cc twice  a day (15 cc each nostril)  . omeprazole (PRILOSEC) 20 MG capsule Take 1 capsule (20 mg total) by mouth 2 (two) times daily.  Glory Rosebush DELICA LANCETS 16X MISC Use as directed. Dx 250.0  . PRESCRIPTION MEDICATION Place 1 Applicatorful into the nose 2 (two) times daily as needed (sodium chloride/mupiricin  compounded medication for prevent sinus infections).  . sucralfate (CARAFATE) 1 G tablet Take 1 tablet (1 g total) by mouth 4 (four) times daily -  with meals and at bedtime.  . [DISCONTINUED] lipase/protease/amylase (CREON-12/PANCREASE) 12000 UNITS CPEP capsule Take by mouth.    Past  Medical History  Diagnosis Date  . Chronic sinusitis     takes Cetirizine daily  . Hashimoto's thyroiditis   . Allergy   . Chronic bronchitis     last time 2 yrs ago  . Arthritis   . Heart murmur   . Hyperlipidemia     doesn't take any meds for this  . Osteoporosis     takes Vit D as needed  . PONV (postoperative nausea and vomiting)     hard to wake up  . Hypertension     takes Losartan and Clonidine nightly  . OSA (obstructive sleep apnea)     doesn't use a cpap  . Gastritis   . H/O hiatal hernia   . Diverticulosis   . Urinary frequency   . Anemia   . History of blood transfusion     in the 60's no abnormal reaction noted  . Insomnia     takes Trazodone nightly  . Diabetes mellitus without complication     borderline  . Bilateral cataracts     immature  . Dry eyes   . Anxiety   . Depression     doesn't take any meds  . GERD (gastroesophageal reflux disease)     TAKES OMEPRAZOLE DAILY    Past Surgical History  Procedure Laterality Date  . Nasal sinus surgery  2006    x 2  . Right arm surgery  1960  . Tcs    . Liposuction    . Cholecystectomy  12/07/2012    Dr Lucia Gaskins  . Cholecystectomy N/A 12/07/2012    Procedure: LAPAROSCOPIC CHOLECYSTECTOMY WITH INTRAOPERATIVE CHOLANGIOGRAM;  Surgeon: Shann Medal, MD;  Location: [place];  Service: General;  Laterality: N/A;    Social History  reports that she has quit smoking. Her smoking use included Cigarettes. She has a 9 pack-year smoking history. She has never used smokeless tobacco. She reports that she does not drink alcohol or use illicit drugs.  Family History family history includes Alcohol abuse in her father; Arthritis in her mother; Cancer in her mother; Early death in her father; Heart disease in her mother; Hyperlipidemia in her mother; Hypertension in her mother; Stroke in her mother.        Review of Systems  Constitutional: Negative.   Respiratory: Negative.   Cardiovascular: Negative.    Gastrointestinal: Negative.   Musculoskeletal: Negative.   Skin: Negative.   Neurological: Negative.   Hematological: Negative.   Psychiatric/Behavioral: Positive for sleep disturbance. The patient is nervous/anxious.   All other systems reviewed and are negative.   BP 152/68 mmHg  Pulse 86  Ht _0  (1.6 m)  Wt 147 lb 8 oz (66.906 kg)  BMI 26.14 kg/m2   Physical Exam  Constitutional: She is oriented to person, place, and time. She appears well-developed and well-nourished.  HENT:  Head: Normocephalic.  Nose: Nose normal.  Mouth/Throat: Oropharynx is clear and moist.  Eyes: Conjunctivae are normal. Pupils are equal, round, and reactive to light.  Neck: Normal range of motion. Neck supple. No JVD present. Carotid bruit is present.  Cardiovascular: Normal rate, regular rhythm, S1 normal, S2 normal, normal heart sounds and intact distal pulses.  Exam reveals no gallop and no friction rub.   No murmur heard. Pulmonary/Chest: Effort normal and breath sounds normal. No respiratory distress. She has no wheezes. She has no rales. She exhibits no tenderness.  Abdominal: Soft. Bowel sounds are normal. She exhibits no distension. There is no tenderness.  Musculoskeletal: Normal range of motion. She exhibits no edema or tenderness.  Lymphadenopathy:    She has no cervical adenopathy.  Neurological: She is alert and oriented to person, place, and time. Coordination normal.  Skin: Skin is warm and dry. No rash noted. No erythema.  Psychiatric: She has a normal mood and affect. Her behavior is normal. Judgment and thought content normal.    Assessment and Plan  Nursing note and vitals reviewed.

## 2014-03-08 NOTE — Assessment & Plan Note (Signed)
She feels that some of her anxiety comes secondary to her husband. He might have memory issues

## 2014-03-18 ENCOUNTER — Telehealth: Payer: Self-pay | Admitting: Internal Medicine

## 2014-03-18 NOTE — Telephone Encounter (Addendum)
Patient dismissed from [facility] by Ronette Deter, MD effective 03/07/2014. Dismissal letter sent out by certified mail on 10/23/5745  Received signed domestic return receipt verifying delivery of certified letter on 3/40/37.  Article number [id number] [place]

## 2014-03-19 ENCOUNTER — Ambulatory Visit: Payer: Self-pay | Admitting: Ophthalmology

## 2014-03-22 ENCOUNTER — Telehealth: Payer: Self-pay

## 2014-03-22 ENCOUNTER — Encounter (INDEPENDENT_AMBULATORY_CARE_PROVIDER_SITE_OTHER): Payer: PPO

## 2014-03-22 DIAGNOSIS — I6523 Occlusion and stenosis of bilateral carotid arteries: Secondary | ICD-10-CM

## 2014-03-22 DIAGNOSIS — R0989 Other specified symptoms and signs involving the circulatory and respiratory systems: Secondary | ICD-10-CM

## 2014-03-22 DIAGNOSIS — E785 Hyperlipidemia, unspecified: Secondary | ICD-10-CM

## 2014-03-22 NOTE — Telephone Encounter (Signed)
Pt would like to get her cholesterol checked. She states she has been really watching what she eats, and would like to know what it is. Please call and advise if she can come in for labs.

## 2014-03-22 NOTE — Telephone Encounter (Signed)
Left message for pt that I am putting the order in for fasting labs and for her to call back to schedule a lab appt at her convenience.

## 2014-03-25 ENCOUNTER — Other Ambulatory Visit (INDEPENDENT_AMBULATORY_CARE_PROVIDER_SITE_OTHER): Payer: PPO

## 2014-03-25 DIAGNOSIS — E785 Hyperlipidemia, unspecified: Secondary | ICD-10-CM

## 2014-03-26 LAB — HEPATIC FUNCTION PANEL
ALT: 21 IU/L (ref 0–32)
AST: 22 IU/L (ref 0–40)
Albumin: 4.5 g/dL (ref 3.5–4.8)
Alkaline Phosphatase: 84 IU/L (ref 39–117)
Bilirubin Total: 0.2 mg/dL (ref 0.0–1.2)
Bilirubin, Direct: 0.08 mg/dL (ref 0.00–0.40)
TOTAL PROTEIN: 6.8 g/dL (ref 6.0–8.5)

## 2014-03-26 LAB — LIPID PANEL
CHOLESTEROL TOTAL: 221 mg/dL — AB (ref 100–199)
Chol/HDL Ratio: 4.6 ratio units — ABNORMAL HIGH (ref 0.0–4.4)
HDL: 48 mg/dL (ref >39)
LDL CALC: 145 mg/dL — AB (ref 0–99)
Triglycerides: 139 mg/dL (ref 0–149)
VLDL CHOLESTEROL CAL: 28 mg/dL (ref 5–40)

## 2014-03-27 ENCOUNTER — Telehealth: Payer: Self-pay | Admitting: *Deleted

## 2014-03-27 NOTE — Telephone Encounter (Signed)
Pt calling about her lab work, states she can not log into her mychart, so she is calling about them  Please call patient.

## 2014-03-27 NOTE — Telephone Encounter (Signed)
Please see result note 

## 2014-03-28 ENCOUNTER — Other Ambulatory Visit: Payer: Self-pay

## 2014-03-28 MED ORDER — ATORVASTATIN CALCIUM 40 MG PO TABS: 40.0000 mg | ORAL_TABLET | Freq: Every day | ORAL | Status: DC

## 2014-04-15 ENCOUNTER — Ambulatory Visit: Payer: Self-pay | Admitting: Internal Medicine

## 2014-04-15 ENCOUNTER — Telehealth: Payer: Self-pay | Admitting: Internal Medicine

## 2014-04-15 NOTE — Telephone Encounter (Signed)
Bone density testing showed T-score -2.3. She will need to set up follow up with her new PCP for this.

## 2014-04-15 NOTE — Telephone Encounter (Signed)
Mailed copy of results with a note advising her to review the results with her new primary care provider.

## 2014-05-01 ENCOUNTER — Other Ambulatory Visit: Payer: Self-pay | Admitting: Cardiovascular Disease

## 2014-05-07 ENCOUNTER — Encounter: Payer: Self-pay | Admitting: Internal Medicine

## 2014-05-18 NOTE — Op Note (Signed)
PATIENT NAME:  Yvonne Davidson, Yvonne Davidson MR#:  [id number] DATE OF BIRTH:  [birth year]  DATE OF PROCEDURE:  04/24/2013  PREOPERATIVE DIAGNOSIS: Visually significant cataract of the right eye.   POSTOPERATIVE DIAGNOSIS: Visually significant cataract of the right eye.   OPERATIVE PROCEDURE: Cataract extraction by phacoemulsification with implant of intraocular lens to the right eye.   SURGEON: Birder Robson, MD.   ANESTHESIA:  1. Managed anesthesia care.  2. Topical tetracaine drops followed by 2% Xylocaine jelly applied in the preoperative holding area.   COMPLICATIONS: None.   TECHNIQUE:  Stop and chop.   DESCRIPTION OF PROCEDURE: The patient was examined and consented in the preoperative holding area where the aforementioned topical anesthesia was applied to the right eye and then brought back to the Operating Room where the right eye was prepped and draped in the usual sterile ophthalmic fashion and a lid speculum was placed. A paracentesis was created with the side port blade and the anterior chamber was filled with viscoelastic. A near clear corneal incision was performed with the steel keratome. A continuous curvilinear capsulorrhexis was performed with a cystotome followed by the capsulorrhexis forceps. Hydrodissection and hydrodelineation were carried out with BSS on a blunt cannula. The lens was removed in a stop and chop technique and the remaining cortical material was removed with the irrigation-aspiration handpiece. The capsular bag was inflated with viscoelastic and the Tecnis ZCB00 20.0-diopter lens, serial number [id number] was placed in the capsular bag without complication. The remaining viscoelastic was removed from the eye with the irrigation-aspiration handpiece. The wounds were hydrated. The anterior chamber was flushed with Miostat and the eye was inflated to physiologic pressure. 0.1 mL of cefuroxime concentration 10 mg/mL was placed in the anterior chamber. The wounds were found  to be water tight. The eye was dressed with Vigamox. The patient was given protective glasses to wear throughout the day and a shield with which to sleep tonight. The patient was also given drops with which to begin a drop regimen today and will follow-up with me in one day.   ____________________________ Livingston Diones. Gissel Keilman, MD wlp:gb D: 04/24/2013 21:28:53 ET T: 04/24/2013 23:30:53 ET JOB#: [id number]  cc: Aramis Zobel L. Daune Colgate, MD, <Dictator> Livingston Diones Vennesa Bastedo MD ELECTRONICALLY SIGNED 04/25/2013 8:58

## 2014-05-26 NOTE — Op Note (Signed)
PATIENT NAME:  Yvonne Davidson, Yvonne Davidson MR#:  [id number] DATE OF BIRTH:  Apr 13, 1940  DATE OF PROCEDURE:  03/19/2014  PREOPERATIVE DIAGNOSIS:  Nuclear sclerotic cataract of the left eye.   POSTOPERATIVE DIAGNOSIS:  Nuclear sclerotic cataract of the left eye.   OPERATIVE PROCEDURE:  Cataract extraction by phacoemulsification with implant of intraocular lens to left eye.   SURGEON:  Birder Robson, MD.   ANESTHESIA:  1. Managed anesthesia care.  2. Topical tetracaine drops followed by 2% Xylocaine jelly applied in the preoperative holding area.   COMPLICATIONS:  None.   TECHNIQUE:  Stop and chop.  DESCRIPTION OF PROCEDURE:  The patient was examined and consented in the preoperative holding area where the aforementioned topical anesthesia was applied to the left eye and then brought back to the Operating Room where the left eye was prepped and draped in the usual sterile ophthalmic fashion and a lid speculum was placed. A paracentesis was created with the side port blade and the anterior chamber was filled with viscoelastic. A near clear corneal incision was performed with the steel keratome. A continuous curvilinear capsulorrhexis was performed with a cystotome followed by the capsulorrhexis forceps. Hydrodissection and hydrodelineation were carried out with BSS on a blunt cannula. The lens was removed in a stop and chop technique and the remaining cortical material was removed with the irrigation-aspiration handpiece. The capsular bag was inflated with viscoelastic and the Alcon SN60WF Tecnis ZCB00 21.5-diopter lens, serial number [id number] was placed in the capsular bag without complication. The remaining viscoelastic was removed from the eye with the irrigation-aspiration handpiece. The wounds were hydrated. The anterior chamber was flushed with Miostat and the eye was inflated to physiologic pressure. 0.1 mL of cefuroxime concentration 10 mg/mL was placed in the anterior chamber. The wounds were  found to be water tight. The eye was dressed with Vigamox. The patient was given protective glasses to wear throughout the day and a shield with which to sleep tonight. The patient was also given drops with which to begin a drop regimen today and will follow-up with me in one day.    ____________________________ Livingston Diones. Franca Stakes, MD wlp:mw D: 03/19/2014 20:09:06 ET T: 03/20/2014 05:29:16 ET JOB#: [id number]  cc: Masao Junker L. Taimi Towe, MD, <Dictator> Livingston Diones Jennessy Sandridge MD ELECTRONICALLY SIGNED 03/21/2014 10:31

## 2014-06-10 DIAGNOSIS — J329 Chronic sinusitis, unspecified: Secondary | ICD-10-CM | POA: Insufficient documentation

## 2014-06-10 DIAGNOSIS — J310 Chronic rhinitis: Secondary | ICD-10-CM | POA: Insufficient documentation

## 2014-06-11 ENCOUNTER — Ambulatory Visit: Payer: PPO | Admitting: Dietician

## 2014-06-25 DIAGNOSIS — N952 Postmenopausal atrophic vaginitis: Secondary | ICD-10-CM | POA: Insufficient documentation

## 2014-06-25 DIAGNOSIS — N819 Female genital prolapse, unspecified: Secondary | ICD-10-CM | POA: Insufficient documentation

## 2014-07-01 ENCOUNTER — Encounter: Payer: Self-pay | Admitting: Family Medicine

## 2014-07-01 ENCOUNTER — Encounter (INDEPENDENT_AMBULATORY_CARE_PROVIDER_SITE_OTHER): Payer: Self-pay

## 2014-07-01 ENCOUNTER — Ambulatory Visit (INDEPENDENT_AMBULATORY_CARE_PROVIDER_SITE_OTHER): Payer: PPO | Admitting: Family Medicine

## 2014-07-01 VITALS — BP 138/88 | HR 76 | Temp 98.8°F | Resp 18 | Ht 63.0 in | Wt 142.5 lb

## 2014-07-01 DIAGNOSIS — L989 Disorder of the skin and subcutaneous tissue, unspecified: Secondary | ICD-10-CM | POA: Diagnosis not present

## 2014-07-01 DIAGNOSIS — E785 Hyperlipidemia, unspecified: Secondary | ICD-10-CM

## 2014-07-01 DIAGNOSIS — J309 Allergic rhinitis, unspecified: Secondary | ICD-10-CM | POA: Insufficient documentation

## 2014-07-01 DIAGNOSIS — I10 Essential (primary) hypertension: Secondary | ICD-10-CM | POA: Diagnosis not present

## 2014-07-01 DIAGNOSIS — D649 Anemia, unspecified: Secondary | ICD-10-CM | POA: Insufficient documentation

## 2014-07-01 NOTE — Progress Notes (Signed)
Name: Yvonne Davidson   MRN: [id number]    DOB: [birth year]   Date:07/01/2014       Progress Note  Subjective  Chief Complaint  Chief Complaint  Patient presents with  . Establish Care  . Leg Injury    patient had a skin cancer removed on 06/26/2014. Has concerns if it is healing properly.    HPI  Skin Cancer Follow-up: Patient presents for follow-up general skin examination.  Patient has a history of unknown, pathology report pending. Removed from the lower leg, right posterior lower calf on  06/26/14. Healing well without redness, warmth, tenderness, fevers, chills.   Diabetes and thyroid followed by Dr. Gabriel Carina, Endocrinology. She would like routine blood work other than her Diabetes and Thyroid testing as this is done through Dr. Gabriel Carina.  Followed by [facility] for sleep apnea.       Patient Active Problem List   Diagnosis Date Noted  . Allergic rhinitis 07/01/2014  . Absolute anemia 07/01/2014  . Female genital prolapse 06/25/2014  . Chronic rhinitis 06/10/2014  . Carotid bruit 03/08/2014  . Helicobacter pylori gastrointestinal tract infection 12/14/2013  . Obstructive sleep apnea 10/11/2013  . Hair loss 09/10/2013  . Type 2 diabetes mellitus 09/07/2013  . Skin lesion 07/23/2013  . Right shoulder pain 06/21/2013  . Multiple allergies 03/16/2013  . Unspecified sinusitis (chronic) 03/16/2013  . Hyperlipidemia LDL goal <100 03/14/2013  . Rib pain 01/02/2013  . Insomnia 11/13/2012  . 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 Medicare annual wellness visit, subsequent 05/17/2012  . Cholelithiasis 05/17/2012  . Unspecified gastritis and gastroduodenitis without mention of hemorrhage 05/17/2012  . Asthma 02/14/2012  . Hypertension goal BP (blood pressure) < 150/90 02/14/2012  . Hashimoto's disease 10/14/2011  . Adjustment disorder with mixed anxiety and depressed mood 10/14/2011  . GERD 11/15/2005  . IBS 11/15/2005  . OSTEOPOROSIS 11/15/2005     History  Substance Use Topics  . Smoking status: Former Smoker -- 0.50 packs/day for 18 years    Types: Cigarettes  . Smokeless tobacco: Never Used     Comment: quit in 1978  . Alcohol Use: No     Current outpatient prescriptions:  .  Artificial Tear Ointment (REFRESH LACRI-LUBE) OINT, Apply 1 application to eye at bedtime., Disp: , Rfl:  .  B-D ULTRA-FINE 33 LANCETS MISC, Use as directed. Dx 250.0, Disp: , Rfl:  .  Blood Glucose Monitoring Suppl (FIFTY50 GLUCOSE METER 2.0) W/DEVICE KIT, USE AS DIRECTED, Disp: , Rfl:  .  Blood Glucose Monitoring Suppl (ONE TOUCH ULTRA MINI) W/DEVICE KIT, USE AS DIRECTED, Disp: 1 each, Rfl: 0 .  calcium carbonate (OS-CAL - DOSED IN MG OF ELEMENTAL CALCIUM) 1250 (500 CA) MG tablet, Take by mouth., Disp: , Rfl:  .  carboxymethylcellulose (REFRESH PLUS) 0.5 % SOLN, Place 1 drop into both eyes 3 (three) times daily as needed (for dry eyes). , Disp: , Rfl:  .  carboxymethylcellulose (REFRESH PLUS) 0.5 % SOLN, Apply to eye., Disp: , Rfl:  .  cloNIDine (CATAPRES) 0.1 MG tablet, TAKE 1 TABLET (0.1 MG TOTAL) BY MOUTH 3 (THREE) TIMES DAILY., Disp: 90 tablet, Rfl: 3 .  CREON 24000 UNITS CPEP, TAKE 1 CAPSULE BY MOUTH EVERY DAY, Disp: 180 capsule, Rfl: 0 .  Cyanocobalamin 1000 MCG TBCR, Take by mouth., Disp: , Rfl:  .  DHA-EPA-VITAMIN E PO, Take by mouth., Disp: , Rfl:  .  Difluprednate 0.05 % EMUL, , Disp: , Rfl:  .  Estriol Micronized POWD,  Med Name: Estriol 1 mg/gram30 gram tube2 grams nighty for two weeks, then every other night for two weeks, then twice a week thereafter, Disp: , Rfl:  .  glucose blood (CHOICE DM FORA G20 TEST STRIPS) test strip, Use as instructed, Disp: 100 each, Rfl: 12 .  glucose blood test strip, Use as instructed, Disp: 100 each, Rfl: 12 .  losartan (COZAAR) 100 MG tablet, TAKE 1 TABLET (100 MG TOTAL) BY MOUTH EVERY MORNING., Disp: 90 tablet, Rfl: 3 .  metFORMIN (GLUCOPHAGE) 500 MG tablet, Take 1 tablet (500 mg total) by mouth 2 (two)  times daily with a meal., Disp: 60 tablet, Rfl: 3 .  mupirocin ointment (BACTROBAN) 2 %, Place 1 application into the nose 2 (two) times daily. Mupirocin 2% ointment in NS #1000 Use 30 cc twice a day (15 cc each nostril), Disp: 30 g, Rfl: 4 .  nepafenac (ILEVRO) 0.3 % ophthalmic suspension, , Disp: , Rfl:  .  Omega-3 Fatty Acids (FISH OIL) 1000 MG CAPS, Take by mouth., Disp: , Rfl:  .  ONETOUCH DELICA LANCETS 91P MISC, Use as directed. Dx 250.0, Disp: 100 each, Rfl: 3 .  PRESCRIPTION MEDICATION, Place 1 Applicatorful into the nose 2 (two) times daily as needed (sodium chloride/mupiricin  compounded medication for prevent sinus infections)., Disp: , Rfl:  .  Red Yeast Rice Extract 600 MG CAPS, Take by mouth., Disp: , Rfl:  .  sucralfate (CARAFATE) 1 G tablet, Take 1 tablet (1 g total) by mouth 4 (four) times daily -  with meals and at bedtime., Disp: 120 tablet, Rfl: 0 .  Ubiquinol 100 MG CAPS, Take by mouth., Disp: , Rfl:   Past Surgical History  Procedure Laterality Date  . Nasal sinus surgery  2006    x 2  . Right arm surgery  1960  . Tcs    . Liposuction    . Cholecystectomy  12/07/2012    Dr Lucia Gaskins  . Cholecystectomy N/A 12/07/2012    Procedure: LAPAROSCOPIC CHOLECYSTECTOMY WITH INTRAOPERATIVE CHOLANGIOGRAM;  Surgeon: Shann Medal, MD;  Location: [facility];  Service: General;  Laterality: N/A;    Family History  Problem Relation Age of Onset  . Arthritis Mother   . Heart disease Mother   . Hyperlipidemia Mother   . Hypertension Mother   . Stroke Mother   . Cancer Mother     Breast & Uterine - 20'-30's  . Early death Father     Fire  . Alcohol abuse Father     Allergies  Allergen Reactions  . Buprenorphine Hcl Anaphylaxis    "Cant breath when I take it"  . Codeine Shortness Of Breath  . Mold Extract [Trichophyton Mentagrophyte] Shortness Of Breath  . Morphine And Related Anaphylaxis    "Cant breath when I take it"  . Oxycodone Shortness Of Breath  . Oxycodone Hcl  Anaphylaxis    ALL NARCOTICS  . Oxycodone Hcl Anaphylaxis    ALL NARCOTICS  . Sumycin [Tetracycline Hcl] Anaphylaxis  . Chlorphen-Phenyleph-Asa     Other reaction(s): Other (See Comments) Shortness of breath  . Decongest-Aid [Pseudoephedrine]   . Esomeprazole Magnesium Other (See Comments)    Stomach issues  . Esomeprazole Magnesium     Other reaction(s): Other (See Comments) Stomach issues  . Hydrochlorothiazide Other (See Comments)    pancreatitis  . Indomethacin Other (See Comments)    disorientation  . Procaine Hcl Other (See Comments)    TACHYCARDIA   . Keflex [Cephalexin] Rash  Unknown, allergic to a lot of antibiotics  . Latex Rash    Breaks out Breaks out  . Neomycin-Bacitracin Zn-Polymyx Rash  . Pseudoephedrine Hcl Er Palpitations  . Sudafed Pe Cold & Cough Child  [Phenylephrine-Dm] Palpitations  . Telithromycin Rash  . Tetracycline Rash     Review of Systems  CONSTITUTIONAL: No significant weight changes, fever, chills, weakness or fatigue.  HEENT:  - Eyes: No visual changes.  - Ears: No auditory changes. No pain.  - Nose: No sneezing, congestion, runny nose. - Throat: No sore throat. No changes in swallowing. SKIN: Right leg lesion CARDIOVASCULAR: No chest pain, chest pressure or chest discomfort. No palpitations or edema.  RESPIRATORY: No shortness of breath, cough or sputum.  GASTROINTESTINAL: No anorexia, nausea, vomiting. No changes in bowel habits. No abdominal pain or blood.  GENITOURINARY: No dysuria. No frequency. No discharge.  NEUROLOGICAL: No headache, dizziness, syncope, paralysis, ataxia, numbness or tingling in the extremities. No memory changes. No change in bowel or bladder control.  MUSCULOSKELETAL: No joint pain. No muscle pain. HEMATOLOGIC: No anemia, bleeding or bruising.  LYMPHATICS: No enlarged lymph nodes.  PSYCHIATRIC: No change in mood. No change in sleep pattern.  ENDOCRINOLOGIC: No reports of sweating, cold or heat  intolerance. No polyuria or polydipsia.    Objective  BP 138/88 mmHg  Pulse 76  Temp(Src) 98.8 F (37.1 C) (Oral)  Resp 18  Ht _0  (1.6 m)  Wt 142 lb 8 oz (64.638 kg)  BMI 25.25 kg/m2  SpO2 97%  Physical Exam  Constitutional: Patient appears well-developed and well-nourished. In no distress.  HEENT:  - Head: Normocephalic and atraumatic.  - Ears: Bilateral TMs gray, no erythema or effusion - Nose: Nasal mucosa moist - Mouth/Throat: Oropharynx is clear and moist. No tonsillar hypertrophy or erythema. No post nasal drainage.  - Eyes: Conjunctivae clear, EOM movements normal. PERRLA. No scleral icterus.  Neck: Normal range of motion. Neck supple. No JVD present. No thyromegaly present.  Cardiovascular: Normal rate, regular rhythm and normal heart sounds.  No murmur heard.  Pulmonary/Chest: Effort normal and breath sounds normal. No respiratory distress. Musculoskeletal: Normal range of motion bilateral UE and LE, no joint effusions. Peripheral vascular: Bilateral LE no edema. Neurological: CN II-XII grossly intact with no focal deficits. Alert and oriented to person, place, and time. Coordination, balance, strength, speech and gait are normal.  Skin: Right LE posterior midline with a puncture wound about 0.75cm diameter with well healing margins, good granulation tissue, no edema/errythema/warmth/tenderness/discharge Psychiatric: Patient has a normal mood and affect. Behavior is normal in office today. Judgment and thought content normal in office today.   Assessment & Plan  1. Skin lesion Healing well, pending pathology report.   2. Hyperlipidemia LDL goal <100 Stopped statin therapy on her own. Would like to check her FLP and continue lifestyle management.  - Lipid panel; Future  3. Hypertension goal BP (blood pressure) < 150/90 Reasonably controled.  - CBC with Differential/Platelet; Future - COMPLETE METABOLIC PANEL WITH GFR; Future

## 2014-07-04 LAB — LIPID PANEL
CHOL/HDL RATIO: 4.3 ratio (ref 0.0–4.4)
CHOLESTEROL TOTAL: 188 mg/dL (ref 100–199)
HDL: 44 mg/dL (ref >39)
LDL Calculated: 132 mg/dL — ABNORMAL HIGH (ref 0–99)
TRIGLYCERIDES: 61 mg/dL (ref 0–149)
VLDL Cholesterol Cal: 12 mg/dL (ref 5–40)

## 2014-07-04 LAB — CBC WITH DIFFERENTIAL/PLATELET
BASOS ABS: 0 10*3/uL (ref 0.0–0.2)
Basos: 0 %
EOS (ABSOLUTE): 0.2 10*3/uL (ref 0.0–0.4)
Eos: 3 %
Hematocrit: 39.4 % (ref 34.0–46.6)
Hemoglobin: 13 g/dL (ref 11.1–15.9)
Immature Grans (Abs): 0 10*3/uL (ref 0.0–0.1)
Immature Granulocytes: 0 %
Lymphocytes Absolute: 2.4 10*3/uL (ref 0.7–3.1)
Lymphs: 29 %
MCH: 30 pg (ref 26.6–33.0)
MCHC: 33 g/dL (ref 31.5–35.7)
MCV: 91 fL (ref 79–97)
MONOCYTES: 8 %
Monocytes Absolute: 0.7 10*3/uL (ref 0.1–0.9)
NEUTROS PCT: 60 %
Neutrophils Absolute: 5 10*3/uL (ref 1.4–7.0)
Platelets: 283 10*3/uL (ref 150–379)
RBC: 4.33 x10E6/uL (ref 3.77–5.28)
RDW: 13.8 % (ref 12.3–15.4)
WBC: 8.3 10*3/uL (ref 3.4–10.8)

## 2014-07-04 LAB — CMP14+EGFR
ALK PHOS: 63 IU/L (ref 39–117)
ALT: 14 IU/L (ref 0–32)
AST: 14 IU/L (ref 0–40)
Albumin/Globulin Ratio: 2.2 (ref 1.1–2.5)
Albumin: 4.3 g/dL (ref 3.5–4.8)
BUN/Creatinine Ratio: 23 (ref 11–26)
BUN: 17 mg/dL (ref 8–27)
Bilirubin Total: <0.2 mg/dL (ref 0.0–1.2)
CO2: 24 mmol/L (ref 18–29)
CREATININE: 0.73 mg/dL (ref 0.57–1.00)
Calcium: 9.2 mg/dL (ref 8.7–10.3)
Chloride: 105 mmol/L (ref 97–108)
GFR calc Af Amer: 94 mL/min/{1.73_m2} (ref >59)
GFR calc non Af Amer: 81 mL/min/{1.73_m2} (ref >59)
GLUCOSE: 109 mg/dL — AB (ref 65–99)
Globulin, Total: 2 g/dL (ref 1.5–4.5)
POTASSIUM: 4.3 mmol/L (ref 3.5–5.2)
SODIUM: 142 mmol/L (ref 134–144)
Total Protein: 6.3 g/dL (ref 6.0–8.5)

## 2014-07-24 DIAGNOSIS — E785 Hyperlipidemia, unspecified: Secondary | ICD-10-CM | POA: Insufficient documentation

## 2014-07-24 DIAGNOSIS — E1169 Type 2 diabetes mellitus with other specified complication: Secondary | ICD-10-CM | POA: Insufficient documentation

## 2014-08-05 ENCOUNTER — Encounter: Payer: PPO | Attending: Internal Medicine | Admitting: Dietician

## 2014-08-05 ENCOUNTER — Encounter: Payer: Self-pay | Admitting: Dietician

## 2014-08-05 VITALS — BP 153/81 | Ht 62.5 in | Wt 145.4 lb

## 2014-08-05 DIAGNOSIS — E119 Type 2 diabetes mellitus without complications: Secondary | ICD-10-CM | POA: Diagnosis present

## 2014-08-05 NOTE — Progress Notes (Signed)
Diabetes Self-Management Education  Visit Type: First/Initial  Appt. Start Time: 1330 Appt. End Time: 3810  08/05/2014  Yvonne Davidson, identified by name and date of birth, is a 74 y.o. female with a diagnosis of Diabetes: Type 2.  Other people present during visit:      ASSESSMENT  Blood pressure 153/81, height 5' 2.5" (1.588 m), weight 145 lb 6.4 oz (65.953 kg). Body mass index is 26.15 kg/(m^2).  Initial Visit Information:  Are you currently following a meal plan?: No (decreased intake of sugar and gluten)   Are you taking your medications as prescribed?: Yes Are you checking your feet?: No   How often do you need to have someone help you when you read instructions, pamphlets, or other written materials from your doctor or pharmacy?: 1 - Never What is the last grade level you completed in school?: college  Psychosocial:     Patient Belief/Attitude about Diabetes: Motivated to manage diabetes Self-care barriers: None Patient Concerns: Healthy Lifestyle, Glycemic Control, Weight Control, Medication, Nutrition/Meal planning Special Needs: None Preferred Learning Style: Hands on, Visual Learning Readiness: Ready  Complications:   How often do you check your blood sugar?: 1-2 times/day (checks FBG every other day and occasional 1 hrpp) Fasting Blood glucose range (mg/dL): 70-129, 130-179 Postprandial Blood glucose range (mg/dL): 70-129 Have you had a dilated eye exam in the past 12 months?: Yes Have you had a dental exam in the past 12 months?: Yes  Diet Intake:  Breakfast:  (decreased intake of sugar and gluten) Snack (evening):  (eats occasional bedtime snack-cucumber and quinoa) Beverage(s):  (drinks mostly water and occasional sugar free drink)  Exercise:  Exercise: Light (walking / raking leaves) (walks 30-45 min 4-5x/wk)  Individualized Plan for Diabetes Self-Management Training:   Learning Objective:  Patient will have a greater understanding of  diabetes self-management.  Patient education plan per assessed needs and concerns is to attend individual sessions for     Education Topics Reviewed with Patient Today:  Definition of diabetes, type 1 and 2, and the diagnosis of diabetes, Factors that contribute to the development of diabetes Carbohydrate counting, Food label reading, portion sizes and measuring food., Role of diet in the treatment of diabetes and the relationship between the three main macronutrients and blood glucose level Role of exercise on diabetes management, blood pressure control and cardiac health. Reviewed patients medication for diabetes, action, purpose, timing of dose and side effects. Taught/evaluated SMBG meter., Purpose and frequency of SMBG., Yearly dilated eye exam   Relationship between chronic complications and blood glucose control, Dental care Role of stress on diabetes   Lifestyle issues that need to be addressed for better diabetes care  PATIENTS GOALS/Plan (Developed by the patient):  Nutrition: General guidelines for healthy choices and portions discussed (eat 3 balanced meals/day and a small bedtime snack) Physical Activity:  (continue walking 30-45 min 4-5x/wk) Monitoring : test my blood glucose as discussed (note x per day with comment) (check FBG's and 2 hr pp daily)  Plan:   Patient Instructions   Check blood sugars 2 x day before breakfast and 2 hrs after supper every day and record   Exercise: continue walking 30-45 min 4-5x/wk.   Avoid sugar sweetened drinks (soda, tea, coffee, sports drinks, juices)  Eat 3 meals day,   1 small snack at bedtime   Space meals 4-6 hours apart  Complete 3 Day Food Record and bring to next appt  Bring blood sugar records to the next appointment/class  Get a Sharps container   Return for appointment/classes on:  08-15-14   Expected Outcomes:  Demonstrated interest in learning. Expect positive outcomes  Education material provided: general  meal planning guidelines  If problems or questions, patient to contact team via:  [phone]  Future DSME appointment:  (08-15-14 for class 1)

## 2014-08-05 NOTE — Patient Instructions (Signed)
  Check blood sugars 2 x day before breakfast and 2 hrs after supper every day and record   Exercise: continue walking 30-45 min 4-5x/wk.   Avoid sugar sweetened drinks (soda, tea, coffee, sports drinks, juices)  Eat 3 meals day,   1 small snack at bedtime   Space meals 4-6 hours apart  Complete 3 Day Food Record and bring to next appt  Bring blood sugar records to the next appointment/class  Get a Sharps container   Return for appointment/classes on:  08-15-14

## 2014-09-05 ENCOUNTER — Encounter: Payer: Self-pay | Admitting: Dietician

## 2014-09-05 ENCOUNTER — Encounter: Payer: PPO | Attending: Internal Medicine | Admitting: Dietician

## 2014-09-05 VITALS — Wt 146.4 lb

## 2014-09-05 DIAGNOSIS — E119 Type 2 diabetes mellitus without complications: Secondary | ICD-10-CM | POA: Insufficient documentation

## 2014-09-05 NOTE — Progress Notes (Signed)
Appt. Start Time: 9:00  Appt. End Time: 12:00  Class 1 Diabetes Overview - define DM; state own type of DM; identify functions of pancreas and insulin; define insulin deficiency vs insulin resistance  Psychosocial - identify DM as a source of stress; state the effects of stress on BG control; verbalize appropriate stress management techniques; identify personal stress issues   Nutritional Management - describe effects of food on blood glucose; identify sources of carbohydrate, protein and fat; verbalize the importance of balance meals in controlling blood glucose; identify meals as well balanced or not; estimate servings of carbohydrate from menus; use food labels to identify servings size, content of carbohydrate, fiber, protein, fat, saturated fat and sodium; recognize food sources of fat, saturated fat, trans fat, sodium and verbalize goals for intake; describe healthful appropriate food choices when dining out   Exercise - describe the effects of exercise on blood glucose and importance of regular exercise in controlling diabetes; state a plan for personal exercise; verbalize contraindications for exercise  Self-Monitoring - state importance of HBGM and demo procedure accurately; use HBGM results to effectively manage diabetes; identify importance of regular HbA1C testing and goals for results  Acute Complications/Sick Day Guidelines - recognize hyperglycemia and hypoglycemia with causes and effects; identify blood glucose results as high, low or in control; list steps in treating and preventing high and low blood glucose; state appropriate measure to manage blood glucose when ill (need for meds, HBGM plan, when to call physician, need for fluids)  Chronic Complications/Foot, Skin, Eye Dental Care - identify possible long-term complications of diabetes (retinopathy, neuropathy, nephropathy, cardiovascular disease, infections); explain steps in prevention and treatment of chronic complications;  state importance of daily self-foot exams; describe how to examine feet and what to look for; explain appropriate eye and dental care  Lifestyle Changes/Goals & Health/Community Resources - state benefits of making appropriate lifestyle changes; identify habits that need to change (meals, tobacco, alcohol); identify strategies to reduce risk factors for personal health; set goals for proper diabetes care; state need for and frequency of healthcare follow-up; describe appropriate community resources for good health (ADA, web sites, apps)   Pregnancy/Sexual Health - define gestational diabetes; state importance of good blood glucose control and birth control prior to pregnancy; state importance of good blood glucose control in preventing sexual problems (impotence, vaginal dryness, infections, loss of desire); state relationship of blood glucose control and pregnancy outcome; describe risk of maternal and fetal complications  Teaching Materials Used: Class 1 Slides/Notebook Diabetes Booklet ID Card  Medic Alert/Medic ID Forms Sleep Evaluation Exercise Handout Daily Food Record Planning a Balanced Meal Goals for Class 1

## 2014-09-12 ENCOUNTER — Encounter: Payer: Self-pay | Admitting: *Deleted

## 2014-09-12 ENCOUNTER — Encounter: Payer: PPO | Admitting: *Deleted

## 2014-09-12 VITALS — Wt 144.6 lb

## 2014-09-12 DIAGNOSIS — E119 Type 2 diabetes mellitus without complications: Secondary | ICD-10-CM

## 2014-09-12 NOTE — Progress Notes (Signed)

## 2014-09-18 ENCOUNTER — Other Ambulatory Visit: Payer: Self-pay | Admitting: Cardiovascular Disease

## 2014-09-19 ENCOUNTER — Encounter: Payer: PPO | Admitting: Dietician

## 2014-09-19 ENCOUNTER — Encounter: Payer: Self-pay | Admitting: Dietician

## 2014-09-19 VITALS — BP 132/72 | Wt 146.5 lb

## 2014-09-19 DIAGNOSIS — E119 Type 2 diabetes mellitus without complications: Secondary | ICD-10-CM | POA: Diagnosis not present

## 2014-09-19 NOTE — Progress Notes (Signed)

## 2014-09-25 ENCOUNTER — Encounter: Payer: Self-pay | Admitting: Dietician

## 2014-09-27 DIAGNOSIS — D8989 Other specified disorders involving the immune mechanism, not elsewhere classified: Secondary | ICD-10-CM | POA: Insufficient documentation

## 2014-09-27 DIAGNOSIS — R5382 Chronic fatigue, unspecified: Secondary | ICD-10-CM

## 2014-09-27 DIAGNOSIS — G9332 Myalgic encephalomyelitis/chronic fatigue syndrome: Secondary | ICD-10-CM | POA: Insufficient documentation

## 2014-10-21 ENCOUNTER — Telehealth: Payer: Self-pay

## 2014-10-21 NOTE — Telephone Encounter (Signed)
Pt called, states she needs a note stateing she is ok to hike.

## 2014-10-21 NOTE — Telephone Encounter (Signed)
Okay to go hiking No restrictions  Would find out how much cholesterol pill she is taking Cholesterol did drop to 188, goal is less than 150 given her severe carotid disease Goal LDL less than 70 Suspect we will need to go up on her cholesterol medication to achieve this goal otherwise she will have progression of her carotid disease  Reschedule carotid one year from her prior study, sometime in spring 2017

## 2014-10-21 NOTE — Telephone Encounter (Signed)
She is overdue for an appt. Can I write her a letter?

## 2014-10-22 NOTE — Telephone Encounter (Signed)
Spoke w/ pt.  Advised her of Dr. Donivan Scull recommendation.  She requests a letter be typed and left at the front desk for her to pick up at her convenience.  She reports that she is not taking anything for her cholesterol, as she has had reactions to every med she has taken, including red yeast rice.  She states that she has drastically changed her diet, no longer eating fried foods and is exercising more.  Discussed w/ her that this may not be enough, as genetics will influence her cholesterol. She would like to hold off on adding an additional meds (she is taking all-natural supplements) and will schedule a repeat carotid in the spring.  Asked her to call back w/ any further questions or concerns.

## 2014-11-11 ENCOUNTER — Other Ambulatory Visit: Payer: Self-pay | Admitting: Internal Medicine

## 2014-11-12 ENCOUNTER — Other Ambulatory Visit: Payer: Self-pay | Admitting: Internal Medicine

## 2014-11-18 ENCOUNTER — Other Ambulatory Visit: Payer: Self-pay | Admitting: Cardiovascular Disease

## 2014-11-18 DIAGNOSIS — I6523 Occlusion and stenosis of bilateral carotid arteries: Secondary | ICD-10-CM

## 2014-11-25 ENCOUNTER — Ambulatory Visit (INDEPENDENT_AMBULATORY_CARE_PROVIDER_SITE_OTHER): Payer: PPO

## 2014-11-25 DIAGNOSIS — I6523 Occlusion and stenosis of bilateral carotid arteries: Secondary | ICD-10-CM

## 2014-11-28 ENCOUNTER — Other Ambulatory Visit: Payer: Self-pay

## 2014-11-28 DIAGNOSIS — E785 Hyperlipidemia, unspecified: Secondary | ICD-10-CM

## 2014-11-29 ENCOUNTER — Encounter: Payer: Self-pay | Admitting: Cardiovascular Disease

## 2014-11-29 ENCOUNTER — Other Ambulatory Visit (INDEPENDENT_AMBULATORY_CARE_PROVIDER_SITE_OTHER): Payer: PPO | Admitting: *Deleted

## 2014-11-29 ENCOUNTER — Ambulatory Visit (INDEPENDENT_AMBULATORY_CARE_PROVIDER_SITE_OTHER): Payer: PPO | Admitting: Cardiovascular Disease

## 2014-11-29 VITALS — BP 136/72 | HR 71 | Ht 63.0 in | Wt 148.5 lb

## 2014-11-29 DIAGNOSIS — I6529 Occlusion and stenosis of unspecified carotid artery: Secondary | ICD-10-CM | POA: Insufficient documentation

## 2014-11-29 DIAGNOSIS — E785 Hyperlipidemia, unspecified: Secondary | ICD-10-CM

## 2014-11-29 DIAGNOSIS — I10 Essential (primary) hypertension: Secondary | ICD-10-CM | POA: Diagnosis not present

## 2014-11-29 DIAGNOSIS — G4733 Obstructive sleep apnea (adult) (pediatric): Secondary | ICD-10-CM

## 2014-11-29 DIAGNOSIS — E1159 Type 2 diabetes mellitus with other circulatory complications: Secondary | ICD-10-CM

## 2014-11-29 DIAGNOSIS — I6523 Occlusion and stenosis of bilateral carotid arteries: Secondary | ICD-10-CM | POA: Diagnosis not present

## 2014-11-29 MED ORDER — ROSUVASTATIN CALCIUM 5 MG PO TABS: 5.0000 mg | ORAL_TABLET | Freq: Every day | ORAL | Status: DC

## 2014-11-29 NOTE — Assessment & Plan Note (Signed)
We have encouraged continued exercise, careful diet management in an effort to lose weight. 

## 2014-11-29 NOTE — Progress Notes (Signed)
 Patient ID: Yvonne Davidson, female    DOB: 08/06/1940, 74 y.o.   MRN: [id number]  HPI Comments: [name] is a 74-year-old woman with a history of hypertension, obstructive sleep apnea who is followed by the sleep Center at [facility], chronic pain in her legs, GERD, hypertension. 60-79% carotid disease on the left, 40-59% disease on the right, hyperlipidemia She presents for follow-up of her blood pressure and carotid disease  Carotid ultrasound reviewed with her showing stable but significant disease on the left No dramatic change since 02/2014 In the past she has been reluctant to take a cholesterol medication Most recent hemoglobin A1c 6.6, managed by Dr. Solum She reports her blood pressure is very well controlled in the daytime, poorly controlled at nighttime when she has periods of severe apnea She has seen sleep apnea specialist at [facility] and [facility] and wears nasal pillow with a chin strap, mouthguard Even with his she continues to have severe problems She wakes herself up with underlying o'clock in the middle of the night to take extra clonidine  Other past medical history For obstructive sleep apnea, she wears a mouth piece designed by [facility] .  she has chronic aching in her legs. She attributes this to sleep apnea and poor blood supply to her legs  She  retired at the age of 72. She was working in a cafeteria. She was unable to maintain the pace with such poor sleep on a chronic basis    Allergies  Allergen Reactions  . Buprenorphine Hcl Anaphylaxis    "Cant breath when I take it"  . Codeine Shortness Of Breath  . Mold Extract [Trichophyton Mentagrophyte] Shortness Of Breath  . Morphine And Related Anaphylaxis    "Cant breath when I take it"  . Oxycodone Shortness Of Breath  . Oxycodone Hcl Anaphylaxis    ALL NARCOTICS  . Oxycodone Hcl Anaphylaxis    ALL NARCOTICS  . Sumycin [Tetracycline Hcl] Anaphylaxis  . Chlorphen-Phenyleph-Asa     Other  reaction(s): Other (See Comments) Shortness of breath  . Decongest-Aid [Pseudoephedrine]   . Esomeprazole Magnesium Other (See Comments)    Stomach issues  . Esomeprazole Magnesium     Other reaction(s): Other (See Comments) Stomach issues  . Hydrochlorothiazide Other (See Comments)    pancreatitis  . Indomethacin Other (See Comments)    disorientation  . Procaine Hcl Other (See Comments)    TACHYCARDIA   . Keflex [Cephalexin] Rash    Unknown, allergic to a lot of antibiotics  . Latex Rash    Breaks out Breaks out  . Neomycin-Bacitracin Zn-Polymyx Rash  . Pseudoephedrine Hcl Er Palpitations  . Sudafed Pe Cold & Cough Child  [Phenylephrine-Dm] Palpitations  . Telithromycin Rash  . Tetracycline Rash    Outpatient Encounter Prescriptions as of 11/29/2014  Medication Sig  . amLODipine (NORVASC) 2.5 MG tablet Take 2.5 mg by mouth daily.   . Artificial Tear Ointment (REFRESH LACRI-LUBE) OINT Apply 1 application to eye at bedtime.  . B-D ULTRA-FINE 33 LANCETS MISC Use as directed. Dx 250.0  . Blood Glucose Monitoring Suppl (FIFTY50 GLUCOSE METER 2.0) W/DEVICE KIT USE AS DIRECTED  . Blood Glucose Monitoring Suppl (ONE TOUCH ULTRA MINI) W/DEVICE KIT USE AS DIRECTED  . calcium carbonate (OS-CAL - DOSED IN MG OF ELEMENTAL CALCIUM) 1250 (500 CA) MG tablet Take by mouth.  . carboxymethylcellulose (REFRESH PLUS) 0.5 % SOLN Place 1 drop into both eyes 3 (three) times daily as needed (for dry eyes).   .   cloNIDine (CATAPRES) 0.1 MG tablet TAKE 1 TABLET BY MOUTH 3 TIMES DAILY.  . CREON 24000 UNITS CPEP TAKE 1 CAPSULE BY MOUTH EVERY DAY  . Cyanocobalamin 1000 MCG TBCR Take by mouth.  . DHA-EPA-VITAMIN E PO Take 600 mg by mouth daily.   . Difluprednate 0.05 % EMUL   . Estriol Micronized POWD Med Name: Estriol 1 mg/gram30 gram tube2 grams nighty for two weeks, then every other night for two weeks, then twice a week thereafter  . glucose blood (CHOICE DM FORA G20 TEST STRIPS) test strip Use as  instructed  . glucose blood test strip Use as instructed  . losartan (COZAAR) 100 MG tablet TAKE 1 TABLET (100 MG TOTAL) BY MOUTH EVERY MORNING.  . metFORMIN (GLUCOPHAGE) 500 MG tablet Take 1 tablet (500 mg total) by mouth 2 (two) times daily with a meal. (Patient taking differently: Take 500 mg by mouth daily. )  . mupirocin ointment (BACTROBAN) 2 % Place 1 application into the nose 2 (two) times daily. Mupirocin 2% ointment in NS #1000 Use 30 cc twice a day (15 cc each nostril)  . nepafenac (ILEVRO) 0.3 % ophthalmic suspension   . ONETOUCH DELICA LANCETS 33G MISC Use as directed. Dx 250.0  . PRESCRIPTION MEDICATION Place 1 Applicatorful into the nose 2 (two) times daily as needed (sodium chloride/mupiricin  compounded medication for prevent sinus infections).  . Ubiquinol 100 MG CAPS Take 100 mg by mouth daily.   . [DISCONTINUED] Omega-3 Fatty Acids (FISH OIL) 1000 MG CAPS Take 1,000 mg by mouth daily.   . [DISCONTINUED] Red Yeast Rice Extract 600 MG CAPS Take by mouth.  . [DISCONTINUED] sucralfate (CARAFATE) 1 G tablet Take 1 tablet (1 g total) by mouth 4 (four) times daily -  with meals and at bedtime.  . rosuvastatin (CRESTOR) 5 MG tablet Take 1 tablet (5 mg total) by mouth daily.  . [DISCONTINUED] carboxymethylcellulose (REFRESH PLUS) 0.5 % SOLN Apply to eye.   No facility-administered encounter medications on file as of 11/29/2014.    Past Medical History  Diagnosis Date  . Chronic sinusitis     takes Cetirizine daily  . Hashimoto's thyroiditis   . Allergy   . Chronic bronchitis     last time 2 yrs ago  . Arthritis   . Heart murmur   . Hyperlipidemia     doesn't take any meds for this  . Osteoporosis     takes Vit D as needed  . PONV (postoperative nausea and vomiting)     hard to wake up  . Hypertension     takes Losartan and Clonidine nightly  . OSA (obstructive sleep apnea)     doesn't use a cpap  . Gastritis   . H/O hiatal hernia   . Diverticulosis   . Urinary  frequency   . Anemia   . History of blood transfusion     in the 60's no abnormal reaction noted  . Insomnia     takes Trazodone nightly  . Diabetes mellitus without complication (HCC)     borderline  . Bilateral cataracts     immature  . Dry eyes   . Anxiety   . Depression     doesn't take any meds  . GERD (gastroesophageal reflux disease)     TAKES OMEPRAZOLE DAILY    Past Surgical History  Procedure Laterality Date  . Nasal sinus surgery  2006    x 2  . Right arm surgery  1960  . [facility]    .   Liposuction    . Cholecystectomy  12/07/2012    Dr Newman  . Cholecystectomy N/A 12/07/2012    Procedure: LAPAROSCOPIC CHOLECYSTECTOMY WITH INTRAOPERATIVE CHOLANGIOGRAM;  Surgeon: David H Newman, MD;  Location: [facility] OR;  Service: General;  Laterality: N/A;    Social History  reports that she has quit smoking. Her smoking use included Cigarettes. She smoked 0.00 packs per day for 18 years. She has never used smokeless tobacco. She reports that she does not drink alcohol or use illicit drugs.  Family History family history includes Alcohol abuse in her father; Arthritis in her mother; Cancer in her mother; Early death in her father; Heart disease in her mother; Hyperlipidemia in her mother; Hypertension in her mother; Stroke in her mother.        Review of Systems  Constitutional: Negative.   Respiratory: Negative.   Cardiovascular: Negative.   Gastrointestinal: Negative.   Musculoskeletal: Negative.   Skin: Negative.   Neurological: Negative.   Hematological: Negative.   Psychiatric/Behavioral: Positive for sleep disturbance. The patient is nervous/anxious.   All other systems reviewed and are negative.   BP 136/72 mmHg  Pulse 71  Ht 5' 3" (1.6 m)  Wt 148 lb 8 oz (67.359 kg)  BMI 26.31 kg/m2   Physical Exam  Constitutional: She is oriented to person, place, and time. She appears well-developed and well-nourished.  HENT:  Head: Normocephalic.  Nose: Nose normal.   Mouth/Throat: Oropharynx is clear and moist.  Eyes: Conjunctivae are normal. Pupils are equal, round, and reactive to light.  Neck: Normal range of motion. Neck supple. No JVD present. Carotid bruit is present.  Cardiovascular: Normal rate, regular rhythm, S1 normal, S2 normal, normal heart sounds and intact distal pulses.  Exam reveals no gallop and no friction rub.   No murmur heard. Pulmonary/Chest: Effort normal and breath sounds normal. No respiratory distress. She has no wheezes. She has no rales. She exhibits no tenderness.  Abdominal: Soft. Bowel sounds are normal. She exhibits no distension. There is no tenderness.  Musculoskeletal: Normal range of motion. She exhibits no edema or tenderness.  Lymphadenopathy:    She has no cervical adenopathy.  Neurological: She is alert and oriented to person, place, and time. Coordination normal.  Skin: Skin is warm and dry. No rash noted. No erythema.  Psychiatric: She has a normal mood and affect. Her behavior is normal. Judgment and thought content normal.    Assessment and Plan  Nursing note and vitals reviewed.       

## 2014-11-29 NOTE — Assessment & Plan Note (Signed)
Will defer management to her specialist that he [place] This may be contributing to her labile blood pressures at nighttime

## 2014-11-29 NOTE — Patient Instructions (Addendum)
Please start crestor/rosouvastatin 5 mg daily  Repeat lipids and LFTs in 3 months  Please call us if you have new issues that need to be addressed before your next appt.  Your physician wants you to follow-up in: 6 months.  You will receive a reminder letter in the mail two months in advance. If you don't receive a letter, please call our office to schedule the follow-up appointment.

## 2014-11-29 NOTE — Assessment & Plan Note (Signed)
60-79% disease on the left Stressed importance of better cholesterol control. She is willing to try a low-dose statin After long discussion of the various treatment options, she will start Crestor 5 mg daily Recheck lipids in 3 months time, order placed  repeat carotid ultrasound every 6 months.

## 2014-11-29 NOTE — Assessment & Plan Note (Signed)
Given her severe carotid disease, goal LDL less than 70 We'll start low-dose Crestor with titration upwards of tolerated Other options would be to add zetia

## 2014-11-29 NOTE — Assessment & Plan Note (Signed)
Blood pressure is well controlled on today's visit. No changes made to the medications. She does report elevated blood pressure at nighttime or sleeping likely from poorly controlled sleep apnea She does see sleep specialist at [facility] and [facility]

## 2014-11-30 LAB — LIPID PANEL
CHOL/HDL RATIO: 5.2 ratio — AB (ref 0.0–4.4)
Cholesterol, Total: 237 mg/dL — ABNORMAL HIGH (ref 100–199)
HDL: 46 mg/dL (ref >39)
LDL Calculated: 162 mg/dL — ABNORMAL HIGH (ref 0–99)
TRIGLYCERIDES: 143 mg/dL (ref 0–149)
VLDL CHOLESTEROL CAL: 29 mg/dL (ref 5–40)

## 2014-11-30 LAB — HEPATIC FUNCTION PANEL
ALBUMIN: 4.2 g/dL (ref 3.5–4.8)
ALT: 19 IU/L (ref 0–32)
AST: 19 IU/L (ref 0–40)
Alkaline Phosphatase: 75 IU/L (ref 39–117)
BILIRUBIN TOTAL: 0.3 mg/dL (ref 0.0–1.2)
Bilirubin, Direct: 0.07 mg/dL (ref 0.00–0.40)
Total Protein: 6.4 g/dL (ref 6.0–8.5)

## 2015-01-01 ENCOUNTER — Encounter: Payer: Self-pay | Admitting: Family Medicine

## 2015-01-01 ENCOUNTER — Ambulatory Visit (INDEPENDENT_AMBULATORY_CARE_PROVIDER_SITE_OTHER): Payer: PPO | Admitting: Family Medicine

## 2015-01-01 VITALS — BP 118/72 | HR 61 | Temp 97.8°F | Resp 12 | Wt 147.0 lb

## 2015-01-01 DIAGNOSIS — B9681 Helicobacter pylori [H. pylori] as the cause of diseases classified elsewhere: Secondary | ICD-10-CM | POA: Insufficient documentation

## 2015-01-01 DIAGNOSIS — I272 Other secondary pulmonary hypertension: Secondary | ICD-10-CM

## 2015-01-01 DIAGNOSIS — I10 Essential (primary) hypertension: Secondary | ICD-10-CM | POA: Diagnosis not present

## 2015-01-01 DIAGNOSIS — E785 Hyperlipidemia, unspecified: Secondary | ICD-10-CM | POA: Diagnosis not present

## 2015-01-01 DIAGNOSIS — M8580 Other specified disorders of bone density and structure, unspecified site: Secondary | ICD-10-CM | POA: Insufficient documentation

## 2015-01-01 DIAGNOSIS — F3341 Major depressive disorder, recurrent, in partial remission: Secondary | ICD-10-CM

## 2015-01-01 DIAGNOSIS — K289 Gastrojejunal ulcer, unspecified as acute or chronic, without hemorrhage or perforation: Secondary | ICD-10-CM

## 2015-01-01 DIAGNOSIS — F418 Other specified anxiety disorders: Secondary | ICD-10-CM

## 2015-01-01 MED ORDER — CLONAZEPAM 0.5 MG PO TABS: 0.5000 mg | ORAL_TABLET | Freq: Every day | ORAL | Status: DC | PRN

## 2015-01-01 NOTE — Patient Instructions (Signed)
1) Complete 3 stool occult cards and return to office for testing.
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
## 2015-01-15 ENCOUNTER — Other Ambulatory Visit: Payer: Self-pay | Admitting: *Deleted

## 2015-01-15 MED ORDER — CLONIDINE HCL 0.1 MG PO TABS: ORAL_TABLET | ORAL | Status: DC

## 2015-01-17 ENCOUNTER — Other Ambulatory Visit: Payer: Self-pay

## 2015-01-17 MED ORDER — CLONIDINE HCL 0.1 MG PO TABS: ORAL_TABLET | ORAL | Status: DC

## 2015-01-17 NOTE — Telephone Encounter (Signed)
Refill sent for Clonidine 0.1 mg take one tablet three times day 90 day supply.

## 2015-01-21 ENCOUNTER — Other Ambulatory Visit: Payer: Self-pay | Admitting: *Deleted

## 2015-01-21 MED ORDER — CLONIDINE HCL 0.1 MG PO TABS: ORAL_TABLET | ORAL | Status: DC

## 2015-01-24 DIAGNOSIS — Z79899 Other long term (current) drug therapy: Secondary | ICD-10-CM | POA: Insufficient documentation

## 2015-01-24 DIAGNOSIS — E119 Type 2 diabetes mellitus without complications: Secondary | ICD-10-CM | POA: Insufficient documentation

## 2015-01-24 DIAGNOSIS — Z91040 Latex allergy status: Secondary | ICD-10-CM | POA: Diagnosis not present

## 2015-01-24 DIAGNOSIS — I10 Essential (primary) hypertension: Secondary | ICD-10-CM | POA: Insufficient documentation

## 2015-01-24 DIAGNOSIS — Z87891 Personal history of nicotine dependence: Secondary | ICD-10-CM | POA: Diagnosis not present

## 2015-01-24 NOTE — ED Notes (Signed)
Pt states has had hypertension since 2200. Pt states was dizzy, took her blood pressure then clonidine and amlodipine. Pt states blood pressure of 99991111 systolic at home. Pt denies shob, pain, dizziness currently.

## 2015-01-25 ENCOUNTER — Emergency Department
Admission: EM | Admit: 2015-01-25 | Discharge: 2015-01-25 | Disposition: A | Discharge status: Home / Self Care | Payer: PPO | Attending: Emergency Medicine | Admitting: Emergency Medicine

## 2015-01-25 DIAGNOSIS — I10 Essential (primary) hypertension: Secondary | ICD-10-CM

## 2015-01-25 LAB — URINALYSIS COMPLETE WITH MICROSCOPIC (ARMC ONLY)
BACTERIA UA: NONE SEEN
Bilirubin Urine: NEGATIVE
Glucose, UA: NEGATIVE mg/dL
Ketones, ur: NEGATIVE mg/dL
LEUKOCYTES UA: NEGATIVE
Nitrite: NEGATIVE
PH: 6 (ref 5.0–8.0)
PROTEIN: NEGATIVE mg/dL
RBC / HPF: NONE SEEN RBC/hpf (ref 0–5)
SQUAMOUS EPITHELIAL / LPF: NONE SEEN
Specific Gravity, Urine: 1.002 — ABNORMAL LOW (ref 1.005–1.030)

## 2015-01-25 LAB — CBC WITH DIFFERENTIAL/PLATELET
Basophils Absolute: 0 10*3/uL (ref 0–0.1)
Basophils Relative: 1 %
EOS PCT: 3 %
Eosinophils Absolute: 0.2 10*3/uL (ref 0–0.7)
HEMATOCRIT: 40.4 % (ref 35.0–47.0)
Hemoglobin: 13.5 g/dL (ref 12.0–16.0)
LYMPHS ABS: 2.7 10*3/uL (ref 1.0–3.6)
LYMPHS PCT: 33 %
MCH: 29.8 pg (ref 26.0–34.0)
MCHC: 33.4 g/dL (ref 32.0–36.0)
MCV: 89.2 fL (ref 80.0–100.0)
Monocytes Absolute: 0.6 10*3/uL (ref 0.2–0.9)
Monocytes Relative: 7 %
Neutro Abs: 4.6 10*3/uL (ref 1.4–6.5)
Neutrophils Relative %: 56 %
PLATELETS: 253 10*3/uL (ref 150–440)
RBC: 4.53 MIL/uL (ref 3.80–5.20)
RDW: 13.7 % (ref 11.5–14.5)
WBC: 8.2 10*3/uL (ref 3.6–11.0)

## 2015-01-25 LAB — COMPREHENSIVE METABOLIC PANEL
ALK PHOS: 66 U/L (ref 38–126)
ALT: 23 U/L (ref 14–54)
AST: 20 U/L (ref 15–41)
Albumin: 4.5 g/dL (ref 3.5–5.0)
Anion gap: 8 (ref 5–15)
BILIRUBIN TOTAL: 0.6 mg/dL (ref 0.3–1.2)
BUN: 18 mg/dL (ref 6–20)
CALCIUM: 9.2 mg/dL (ref 8.9–10.3)
CHLORIDE: 105 mmol/L (ref 101–111)
CO2: 26 mmol/L (ref 22–32)
CREATININE: 0.68 mg/dL (ref 0.44–1.00)
Glucose, Bld: 133 mg/dL — ABNORMAL HIGH (ref 65–99)
Potassium: 3.8 mmol/L (ref 3.5–5.1)
Sodium: 139 mmol/L (ref 135–145)
TOTAL PROTEIN: 7.4 g/dL (ref 6.5–8.1)

## 2015-01-25 LAB — TROPONIN I: Troponin I: <0.03 ng/mL (ref <0.031)

## 2015-01-25 MED ORDER — CLONIDINE HCL 0.1 MG PO TABS
0.1000 mg | ORAL_TABLET | Freq: Once | ORAL | Status: AC
  Administered 2015-01-25: 0.1 mg via ORAL

## 2015-01-25 MED ORDER — CLONIDINE HCL 0.1 MG PO TABS
ORAL_TABLET | ORAL | Status: AC
Start: 2015-01-25 — End: 2015-01-25
  Administered 2015-01-25: 0.1 mg via ORAL
  Filled 2015-01-25: qty 1

## 2015-01-25 NOTE — Discharge Instructions (Signed)
Hypertension Hypertension, commonly called high blood pressure, is when the force of blood pumping through your arteries is too strong. Your arteries are the blood vessels that carry blood from your heart throughout your body. A blood pressure reading consists of a higher number over a lower number, such as 110/72. The higher number (systolic) is the pressure inside your arteries when your heart pumps. The lower number (diastolic) is the pressure inside your arteries when your heart relaxes. Ideally you want your blood pressure below 120/80. Hypertension forces your heart to work harder to pump blood. Your arteries may become narrow or stiff. Having untreated or uncontrolled hypertension can cause heart attack, stroke, kidney disease, and other problems. RISK FACTORS Some risk factors for high blood pressure are controllable. Others are not.  Risk factors you cannot control include:   Race. You may be at higher risk if you are African American.  Age. Risk increases with age.  Gender. Men are at higher risk than women before age 45 years. After age 65, women are at higher risk than men. Risk factors you can control include:  Not getting enough exercise or physical activity.  Being overweight.  Getting too much fat, sugar, calories, or salt in your diet.  Drinking too much alcohol. SIGNS AND SYMPTOMS Hypertension does not usually cause signs or symptoms. Extremely high blood pressure (hypertensive crisis) may cause headache, anxiety, shortness of breath, and nosebleed. DIAGNOSIS To check if you have hypertension, your health care provider will measure your blood pressure while you are seated, with your arm held at the level of your heart. It should be measured at least twice using the same arm. Certain conditions can cause a difference in blood pressure between your right and left arms. A blood pressure reading that is higher than normal on one occasion does not mean that you need treatment. If  it is not clear whether you have high blood pressure, you may be asked to return on a different day to have your blood pressure checked again. Or, you may be asked to monitor your blood pressure at home for 1 or more weeks. TREATMENT Treating high blood pressure includes making lifestyle changes and possibly taking medicine. Living a healthy lifestyle can help lower high blood pressure. You may need to change some of your habits. Lifestyle changes may include:  Following the DASH diet. This diet is high in fruits, vegetables, and whole grains. It is low in salt, red meat, and added sugars.  Keep your sodium intake below 2,300 mg per day.  Getting at least 30-45 minutes of aerobic exercise at least 4 times per week.  Losing weight if necessary.  Not smoking.  Limiting alcoholic beverages.  Learning ways to reduce stress. Your health care provider may prescribe medicine if lifestyle changes are not enough to get your blood pressure under control, and if one of the following is true:  You are 18-59 years of age and your systolic blood pressure is above 140.  You are 60 years of age or older, and your systolic blood pressure is above 150.  Your diastolic blood pressure is above 90.  You have diabetes, and your systolic blood pressure is over 140 or your diastolic blood pressure is over 90.  You have kidney disease and your blood pressure is above 140/90.  You have heart disease and your blood pressure is above 140/90. Your personal target blood pressure may vary depending on your medical conditions, your age, and other factors. HOME CARE INSTRUCTIONS    Have your blood pressure rechecked as directed by your health care provider.   Take medicines only as directed by your health care provider. Follow the directions carefully. Blood pressure medicines must be taken as prescribed. The medicine does not work as well when you skip doses. Skipping doses also puts you at risk for  problems.  Do not smoke.   Monitor your blood pressure at home as directed by your health care provider. SEEK MEDICAL CARE IF:   You think you are having a reaction to medicines taken.  You have recurrent headaches or feel dizzy.  You have swelling in your ankles.  You have trouble with your vision. SEEK IMMEDIATE MEDICAL CARE IF:  You develop a severe headache or confusion.  You have unusual weakness, numbness, or feel faint.  You have severe chest or abdominal pain.  You vomit repeatedly.  You have trouble breathing. MAKE SURE YOU:   Understand these instructions.  Will watch your condition.  Will get help right away if you are not doing well or get worse.   This information is not intended to replace advice given to you by your health care provider. Make sure you discuss any questions you have with your health care provider.   Document Released: 01/11/2005 Document Revised: 05/28/2014 Document Reviewed: 11/03/2012 Elsevier Interactive Patient Education [employer].  Continue take all your medicines as directed. Please return for any further problems. Please follow-up with your regular doctor.

## 2015-01-25 NOTE — ED Provider Notes (Signed)
[facility] Emergency Department Provider Note  ____________________________________________  Time seen: Approximately 3:59 AM  I have reviewed the triage vital signs and the nursing notes.   HISTORY  Chief Complaint Hypertension HPI Yvonne Davidson is a 74 y.o. female patient reports she missed one of her doses of medicine this morning. This afternoon she took her blood pressure was 038 systolic she took all of her medicine that she was due with blood pressure really did not come down so she came to the emergency room because she got worried. She had no symptoms. Here in the emergency room her blood pressure came down into the 170s she feels fine and she has no shortness of breath chest pain or anything else and is now reassured and else like she can go home. We will let her do so.  Past Medical History  Diagnosis Date  . Chronic sinusitis     takes Cetirizine daily  . Hashimoto's thyroiditis   . Allergy   . Chronic bronchitis     last time 2 yrs ago  . Arthritis   . Heart murmur   . Hyperlipidemia     doesn't take any meds for this  . Osteoporosis     takes Vit D as needed  . PONV (postoperative nausea and vomiting)     hard to wake up  . Hypertension     takes Losartan and Clonidine nightly  . OSA (obstructive sleep apnea)     doesn't use a cpap  . Gastritis   . H/O hiatal hernia   . Diverticulosis   . Urinary frequency   . Anemia   . History of blood transfusion     in the 60's no abnormal reaction noted  . Insomnia     takes Trazodone nightly  . Diabetes mellitus without complication (HCC)     borderline  . Bilateral cataracts     immature  . Dry eyes   . Anxiety   . Depression     doesn't take any meds  . GERD (gastroesophageal reflux disease)     TAKES OMEPRAZOLE DAILY    Patient Active Problem List   Diagnosis Date Noted  . Gastrointestinal ulcer due to Helicobacter pylori 88/28/0034  . Osteopenia 01/01/2015  . Carotid  stenosis 11/29/2014  . CFIDS (chronic fatigue and immune dysfunction syndrome) 09/27/2014  . Allergic rhinitis 07/01/2014  . Absolute anemia 07/01/2014  . Female genital prolapse 06/25/2014  . Atrophy of vagina 06/25/2014  . Chronic rhinitis 06/10/2014  . Chronic infection of sinus 06/10/2014  . Carotid bruit 03/08/2014  . Helicobacter pylori gastrointestinal tract infection 12/14/2013  . Obstructive sleep apnea 10/11/2013  . Hair loss 09/10/2013  . Type 2 diabetes mellitus ([place]) 09/07/2013  . Skin lesion 07/23/2013  . Right shoulder pain 06/21/2013  . Multiple allergies 03/16/2013  . Unspecified sinusitis (chronic) 03/16/2013  . Hyperlipidemia LDL goal <100 03/14/2013  . Rib pain 01/02/2013  . Insomnia 11/13/2012  . 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 Pulmonary hypertension ([place]) 07/23/2012  . Medicare annual wellness visit, subsequent 05/17/2012  . Cholelithiasis 05/17/2012  . Unspecified gastritis and gastroduodenitis without mention of hemorrhage 05/17/2012  . Asthma 02/14/2012  . Hypertension goal BP (blood pressure) < 140/90 02/14/2012  . Anxiety 12/09/2011  . 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 Osteoarthrosis, unspecified whether generalized or localized, unspecified site 12/09/2011  . Pancreatitis 12/09/2011  . Sleep apnea 12/09/2011  . Hashimoto's disease 10/14/2011  . Adjustment disorder with mixed anxiety  and depressed mood 10/14/2011  . GERD 11/15/2005  . IBS 11/15/2005  . Osteoporosis 11/15/2005    Past Surgical History  Procedure Laterality Date  . Nasal sinus surgery  2006    x 2  . Right arm surgery  1960  . Tcs    . Liposuction    . Cholecystectomy  12/07/2012    Dr Lucia Gaskins  . Cholecystectomy N/A 12/07/2012    Procedure: LAPAROSCOPIC CHOLECYSTECTOMY WITH INTRAOPERATIVE CHOLANGIOGRAM;  Surgeon: Shann Medal, MD;  Location: [facility];  Service: General;  Laterality: N/A;    Current Outpatient Rx  Name  Route  Sig  Dispense  Refill  .  amLODipine (NORVASC) 2.5 MG tablet   Oral   Take 2.5 mg by mouth daily.          . Artificial Tear Ointment (REFRESH LACRI-LUBE) OINT   Ophthalmic   Apply 1 application to eye at bedtime.         . B-D ULTRA-FINE 33 LANCETS MISC      Use as directed. Dx 250.0         . Blood Glucose Monitoring Suppl (FIFTY50 GLUCOSE METER 2.0) W/DEVICE KIT      USE AS DIRECTED         . Blood Glucose Monitoring Suppl (ONE TOUCH ULTRA MINI) W/DEVICE KIT      USE AS DIRECTED   1 each   0     NEED RX FOR DELICA 33 LANCETS AND STRIPS   . calcium carbonate (OS-CAL - DOSED IN MG OF ELEMENTAL CALCIUM) 1250 (500 CA) MG tablet   Oral   Take by mouth.         . carboxymethylcellulose (REFRESH PLUS) 0.5 % SOLN   Both Eyes   Place 1 drop into both eyes 3 (three) times daily as needed (for dry eyes).          . Cholecalciferol (VITAMIN D3) 1000 UNITS CAPS   Oral   Take by mouth.         . clonazePAM (KLONOPIN) 0.5 MG tablet   Oral   Take 1 tablet (0.5 mg total) by mouth daily as needed for anxiety.   30 tablet   3   . cloNIDine (CATAPRES) 0.1 MG tablet      TAKE 1 TABLET BY MOUTH 3 TIMES DAILY.   270 tablet   3   . CREON 24000 UNITS CPEP      TAKE 1 CAPSULE BY MOUTH EVERY DAY   180 capsule   0   . Cyanocobalamin 1000 MCG TBCR   Oral   Take by mouth.         . DHA-EPA-VITAMIN E PO   Oral   Take 600 mg by mouth daily.          . Difluprednate 0.05 % EMUL               . Estriol Micronized POWD      Med Name: Estriol 1 mg/gram30 gram tube2 grams nighty for two weeks, then every other night for two weeks, then twice a week thereafter         . glucose blood (CHOICE DM FORA G20 TEST STRIPS) test strip      Use as instructed   100 each   12     Dx E11.9   . glucose blood test strip      Use as instructed   100 each   12     Dx:  E11.9   . losartan (COZAAR) 100 MG tablet      TAKE 1 TABLET (100 MG TOTAL) BY MOUTH EVERY MORNING.   90 tablet    3   . metFORMIN (GLUCOPHAGE) 500 MG tablet   Oral   Take 1 tablet (500 mg total) by mouth 2 (two) times daily with a meal. Patient taking differently: Take 500 mg by mouth daily.    60 tablet   3   . mupirocin ointment (BACTROBAN) 2 %   Nasal   Place 1 application into the nose 2 (two) times daily. Mupirocin 2% ointment in NS #1000 Use 30 cc twice a day (15 cc each nostril)   30 g   4   . nepafenac (ILEVRO) 0.3 % ophthalmic suspension               . omeprazole (PRILOSEC) 40 MG capsule   Oral   Take by mouth.         Glory Rosebush DELICA LANCETS 59D MISC      Use as directed. Dx 250.0   100 each   3   . PRESCRIPTION MEDICATION   Nasal   Place 1 Applicatorful into the nose 2 (two) times daily as needed (sodium chloride/mupiricin  compounded medication for prevent sinus infections).         . rosuvastatin (CRESTOR) 5 MG tablet   Oral   Take 1 tablet (5 mg total) by mouth daily.   90 tablet   4   . sucralfate (CARAFATE) 1 G tablet      TAKE 1 TABLET (1 G TOTAL) BY MOUTH 2 (TWO) TIMES DAILY BEFORE MEALS.      3   . Ubiquinol 100 MG CAPS   Oral   Take 100 mg by mouth daily.            Allergies Buprenorphine hcl; Codeine; Mold extract; Morphine and related; Oxycodone; Oxycodone hcl; Oxycodone hcl; Sumycin; Chlorphen-phenyleph-asa; Decongest-aid; Esomeprazole magnesium; Esomeprazole magnesium; Hydrochlorothiazide; Indomethacin; Procaine hcl; Keflex; Latex; Neomycin-bacitracin zn-polymyx; Pseudoephedrine hcl er; Sudafed pe cold & cough child ; Telithromycin; and Tetracycline  Family History  Problem Relation Age of Onset  . Arthritis Mother   . Heart disease Mother   . Hyperlipidemia Mother   . Hypertension Mother   . Stroke Mother   . Cancer Mother     Breast & Uterine - 20'-30's  . Early death Father     Fire  . Alcohol abuse Father     Social History Social History  Substance Use Topics  . Smoking status: Former Smoker -- 0.00 packs/day for 18  years    Types: Cigarettes  . Smokeless tobacco: Never Used     Comment: quit in 1978  . Alcohol Use: No    Review of Systems Constitutional: No fever/chills Eyes: No visual changes. ENT: No sore throat. Cardiovascular: Denies chest pain. Respiratory: Denies shortness of breath. Gastrointestinal: No abdominal pain.  No nausea, no vomiting.  No diarrhea.  No constipation. Genitourinary: Negative for dysuria. Musculoskeletal: Negative for back pain. Skin: Negative for rash. Neurological: Negative for headaches, focal weakness or numbness.  10-point ROS otherwise negative.  ____________________________________________   PHYSICAL EXAM:  VITAL SIGNS: ED Triage Vitals  Enc Vitals Group     BP 01/24/15 2331 196/60 mmHg     Pulse Rate 01/24/15 2336 61     Resp 01/24/15 2336 16     Temp --      Temp src --  SpO2 01/24/15 2336 100 %     Weight 01/24/15 2336 147 lb (66.679 kg)     Height 01/24/15 2336 5' 2" (1.575 m)     Head Cir --      Peak Flow --      Pain Score --      Pain Loc --      Pain Edu? --      Excl. in [place]? --     Constitutional: Alert and oriented. Well appearing and in no acute distress. Eyes: Conjunctivae are normal. PERRL. EOMI. Head: Atraumatic. Nose: No congestion/rhinnorhea. Mouth/Throat: Mucous membranes are moist.  Oropharynx non-erythematous. Neck: No stridor.   Cardiovascular: Normal rate, regular rhythm. Grossly normal heart sounds.  Good peripheral circulation. Respiratory: Normal respiratory effort.  No retractions. Lungs CTAB. Gastrointestinal: Soft and nontender. No distention. No abdominal bruits. No CVA tenderness. }Musculoskeletal: No lower extremity tenderness nor edema.  No joint effusions. Neurologic:  Normal speech and language. No gross focal neurologic deficits are appreciated. No gait instability. Skin:  Skin is warm, dry and intact. No rash noted. Psychiatric: Mood and affect are normal. Speech and behavior are  normal.  ____________________________________________   LABS (all labs ordered are listed, but only abnormal results are displayed)  Labs Reviewed  COMPREHENSIVE METABOLIC PANEL - Abnormal; Notable for the following:    Glucose, Bld 133 (*)    All other components within normal limits  URINALYSIS COMPLETEWITH MICROSCOPIC ([facility] ONLY) - Abnormal; Notable for the following:    Color, Urine COLORLESS (*)    APPearance CLEAR (*)    Specific Gravity, Urine 1.002 (*)    Hgb urine dipstick 1+ (*)    All other components within normal limits  CBC WITH DIFFERENTIAL/PLATELET  TROPONIN I   ____________________________________________  EKG  EKG read and interpreted by me shows normal sinus rhythm at a rate of 61 normal axis essentially normal EKG ____________________________________________  RADIOLOGY   ____________________________________________   PROCEDURES    ____________________________________________   INITIAL IMPRESSION / ASSESSMENT AND PLAN / ED COURSE  Pertinent labs & imaging results that were available during my care of the patient were reviewed by me and considered in my medical decision making (see chart for details).   ____________________________________________   FINAL CLINICAL IMPRESSION(S) / ED DIAGNOSES  Final diagnoses:  Essential hypertension      Nena Polio, MD 01/25/15 [phone]

## 2015-01-25 NOTE — ED Notes (Signed)
Pt missed a dose of BP med yesterday am and BP went up to [id number] systolic. Pt took her norvasc and clonidine last night and BP didn't come down after an hour and pt became nervous. Pt asymptomatic

## 2015-02-03 ENCOUNTER — Other Ambulatory Visit: Payer: Self-pay | Admitting: Internal Medicine

## 2015-02-03 ENCOUNTER — Encounter: Payer: PPO | Admitting: Family Medicine

## 2015-02-12 ENCOUNTER — Ambulatory Visit (INDEPENDENT_AMBULATORY_CARE_PROVIDER_SITE_OTHER): Payer: PPO | Admitting: Family Medicine

## 2015-02-12 ENCOUNTER — Encounter: Payer: Self-pay | Admitting: Family Medicine

## 2015-02-12 VITALS — BP 124/60 | HR 70 | Temp 98.8°F | Resp 16 | Ht 62.0 in | Wt 150.6 lb

## 2015-02-12 DIAGNOSIS — Z0000 Encounter for general adult medical examination without abnormal findings: Secondary | ICD-10-CM

## 2015-02-12 DIAGNOSIS — E785 Hyperlipidemia, unspecified: Secondary | ICD-10-CM | POA: Diagnosis not present

## 2015-02-12 DIAGNOSIS — Z2821 Immunization not carried out because of patient refusal: Secondary | ICD-10-CM

## 2015-02-12 DIAGNOSIS — E119 Type 2 diabetes mellitus without complications: Secondary | ICD-10-CM | POA: Diagnosis not present

## 2015-02-12 DIAGNOSIS — I10 Essential (primary) hypertension: Secondary | ICD-10-CM | POA: Diagnosis not present

## 2015-02-12 DIAGNOSIS — M2550 Pain in unspecified joint: Secondary | ICD-10-CM | POA: Diagnosis not present

## 2015-02-12 DIAGNOSIS — Z1231 Encounter for screening mammogram for malignant neoplasm of breast: Secondary | ICD-10-CM | POA: Insufficient documentation

## 2015-02-12 LAB — HEMOCCULT GUIAC POC 1CARD (OFFICE)
FECAL OCCULT BLD: NEGATIVE
FECAL OCCULT BLD: NEGATIVE
Fecal Occult Blood, POC: NEGATIVE
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
## 2015-02-13 DIAGNOSIS — I10 Essential (primary) hypertension: Secondary | ICD-10-CM | POA: Diagnosis not present

## 2015-02-13 DIAGNOSIS — E119 Type 2 diabetes mellitus without complications: Secondary | ICD-10-CM | POA: Diagnosis not present

## 2015-02-13 DIAGNOSIS — E785 Hyperlipidemia, unspecified: Secondary | ICD-10-CM | POA: Diagnosis not present

## 2015-02-13 DIAGNOSIS — M2550 Pain in unspecified joint: Secondary | ICD-10-CM | POA: Diagnosis not present

## 2015-02-14 DIAGNOSIS — G4733 Obstructive sleep apnea (adult) (pediatric): Secondary | ICD-10-CM | POA: Diagnosis not present

## 2015-02-15 LAB — LIPID PANEL
CHOL/HDL RATIO: 2.8 ratio (ref 0.0–4.4)
CHOLESTEROL TOTAL: 142 mg/dL (ref 100–199)
HDL: 51 mg/dL (ref >39)
LDL CALC: 75 mg/dL (ref 0–99)
Triglycerides: 80 mg/dL (ref 0–149)
VLDL Cholesterol Cal: 16 mg/dL (ref 5–40)

## 2015-02-15 LAB — CYCLIC CITRUL PEPTIDE ANTIBODY, IGG/IGA: Cyclic Citrullin Peptide Ab: 25 units — ABNORMAL HIGH (ref 0–19)

## 2015-02-15 LAB — CBC WITH DIFFERENTIAL/PLATELET
BASOS ABS: 0 10*3/uL (ref 0.0–0.2)
BASOS: 0 %
EOS (ABSOLUTE): 0.2 10*3/uL (ref 0.0–0.4)
Eos: 2 %
Hematocrit: 40.4 % (ref 34.0–46.6)
Hemoglobin: 13.2 g/dL (ref 11.1–15.9)
IMMATURE GRANS (ABS): 0 10*3/uL (ref 0.0–0.1)
Immature Granulocytes: 0 %
LYMPHS ABS: 2 10*3/uL (ref 0.7–3.1)
Lymphs: 28 %
MCH: 29.9 pg (ref 26.6–33.0)
MCHC: 32.7 g/dL (ref 31.5–35.7)
MCV: 92 fL (ref 79–97)
MONOS ABS: 0.6 10*3/uL (ref 0.1–0.9)
Monocytes: 8 %
NEUTROS ABS: 4.4 10*3/uL (ref 1.4–7.0)
Neutrophils: 62 %
PLATELETS: 304 10*3/uL (ref 150–379)
RBC: 4.41 x10E6/uL (ref 3.77–5.28)
RDW: 14 % (ref 12.3–15.4)
WBC: 7.1 10*3/uL (ref 3.4–10.8)

## 2015-02-15 LAB — HEMOGLOBIN A1C
Est. average glucose Bld gHb Est-mCnc: 143 mg/dL
HEMOGLOBIN A1C: 6.6 % — AB (ref 4.8–5.6)

## 2015-02-15 LAB — SEDIMENTATION RATE: SED RATE: 10 mm/h (ref 0–40)

## 2015-02-15 LAB — COMPREHENSIVE METABOLIC PANEL
ALK PHOS: 66 IU/L (ref 39–117)
ALT: 20 IU/L (ref 0–32)
AST: 18 IU/L (ref 0–40)
Albumin/Globulin Ratio: 2 (ref 1.1–2.5)
Albumin: 4.4 g/dL (ref 3.5–4.8)
BUN/Creatinine Ratio: 16 (ref 11–26)
BUN: 13 mg/dL (ref 8–27)
CHLORIDE: 104 mmol/L (ref 96–106)
CO2: 23 mmol/L (ref 18–29)
Calcium: 9.5 mg/dL (ref 8.7–10.3)
Creatinine, Ser: 0.8 mg/dL (ref 0.57–1.00)
GFR calc non Af Amer: 73 mL/min/{1.73_m2} (ref >59)
GFR, EST AFRICAN AMERICAN: 84 mL/min/{1.73_m2} (ref >59)
GLUCOSE: 123 mg/dL — AB (ref 65–99)
Globulin, Total: 2.2 g/dL (ref 1.5–4.5)
POTASSIUM: 4.7 mmol/L (ref 3.5–5.2)
Sodium: 141 mmol/L (ref 134–144)
TOTAL PROTEIN: 6.6 g/dL (ref 6.0–8.5)

## 2015-02-15 LAB — ANA: ANA: NEGATIVE

## 2015-02-15 LAB — RHEUMATOID FACTOR

## 2015-02-15 LAB — TSH: TSH: 4.28 u[IU]/mL (ref 0.450–4.500)

## 2015-02-19 DIAGNOSIS — Z85828 Personal history of other malignant neoplasm of skin: Secondary | ICD-10-CM | POA: Diagnosis not present

## 2015-02-19 DIAGNOSIS — L821 Other seborrheic keratosis: Secondary | ICD-10-CM | POA: Diagnosis not present

## 2015-02-19 DIAGNOSIS — Z08 Encounter for follow-up examination after completed treatment for malignant neoplasm: Secondary | ICD-10-CM | POA: Diagnosis not present

## 2015-03-03 ENCOUNTER — Other Ambulatory Visit (INDEPENDENT_AMBULATORY_CARE_PROVIDER_SITE_OTHER): Payer: PPO

## 2015-03-03 DIAGNOSIS — I6523 Occlusion and stenosis of bilateral carotid arteries: Secondary | ICD-10-CM | POA: Diagnosis not present

## 2015-03-03 DIAGNOSIS — E785 Hyperlipidemia, unspecified: Secondary | ICD-10-CM | POA: Diagnosis not present

## 2015-03-04 LAB — LIPID PANEL
CHOLESTEROL TOTAL: 126 mg/dL (ref 100–199)
Chol/HDL Ratio: 2.7 ratio units (ref 0.0–4.4)
HDL: 47 mg/dL (ref >39)
LDL Calculated: 67 mg/dL (ref 0–99)
Triglycerides: 62 mg/dL (ref 0–149)
VLDL Cholesterol Cal: 12 mg/dL (ref 5–40)

## 2015-03-04 LAB — HEPATIC FUNCTION PANEL
ALK PHOS: 61 IU/L (ref 39–117)
ALT: 16 IU/L (ref 0–32)
AST: 15 IU/L (ref 0–40)
Albumin: 4.4 g/dL (ref 3.5–4.8)
BILIRUBIN, DIRECT: 0.07 mg/dL (ref 0.00–0.40)
Bilirubin Total: <0.2 mg/dL (ref 0.0–1.2)
TOTAL PROTEIN: 6.5 g/dL (ref 6.0–8.5)

## 2015-03-10 ENCOUNTER — Telehealth: Payer: Self-pay | Admitting: Cardiovascular Disease

## 2015-03-10 NOTE — Telephone Encounter (Signed)
Patient wants lab results from 03-03-15.

## 2015-03-11 ENCOUNTER — Telehealth: Payer: Self-pay

## 2015-03-11 DIAGNOSIS — M2550 Pain in unspecified joint: Secondary | ICD-10-CM

## 2015-03-11 NOTE — Telephone Encounter (Signed)
Wants referral to rheumatologist per labs ([place] location)

## 2015-03-11 NOTE — Telephone Encounter (Signed)
Referral placed.

## 2015-03-12 ENCOUNTER — Emergency Department: Payer: PPO

## 2015-03-12 ENCOUNTER — Emergency Department
Admission: EM | Admit: 2015-03-12 | Discharge: 2015-03-12 | Disposition: A | Discharge status: Home / Self Care | Payer: PPO | Attending: Emergency Medicine | Admitting: Emergency Medicine

## 2015-03-12 ENCOUNTER — Encounter: Payer: Self-pay | Admitting: *Deleted

## 2015-03-12 DIAGNOSIS — S6992XA Unspecified injury of left wrist, hand and finger(s), initial encounter: Secondary | ICD-10-CM | POA: Diagnosis not present

## 2015-03-12 DIAGNOSIS — Z87891 Personal history of nicotine dependence: Secondary | ICD-10-CM | POA: Diagnosis not present

## 2015-03-12 DIAGNOSIS — I10 Essential (primary) hypertension: Secondary | ICD-10-CM | POA: Insufficient documentation

## 2015-03-12 DIAGNOSIS — Z7984 Long term (current) use of oral hypoglycemic drugs: Secondary | ICD-10-CM | POA: Diagnosis not present

## 2015-03-12 DIAGNOSIS — Z79899 Other long term (current) drug therapy: Secondary | ICD-10-CM | POA: Diagnosis not present

## 2015-03-12 DIAGNOSIS — S20212A Contusion of left front wall of thorax, initial encounter: Secondary | ICD-10-CM | POA: Diagnosis not present

## 2015-03-12 DIAGNOSIS — S161XXA Strain of muscle, fascia and tendon at neck level, initial encounter: Secondary | ICD-10-CM | POA: Diagnosis not present

## 2015-03-12 DIAGNOSIS — Y998 Other external cause status: Secondary | ICD-10-CM | POA: Insufficient documentation

## 2015-03-12 DIAGNOSIS — S62397A Other fracture of fifth metacarpal bone, left hand, initial encounter for closed fracture: Secondary | ICD-10-CM | POA: Diagnosis not present

## 2015-03-12 DIAGNOSIS — Y92410 Unspecified street and highway as the place of occurrence of the external cause: Secondary | ICD-10-CM | POA: Diagnosis not present

## 2015-03-12 DIAGNOSIS — M542 Cervicalgia: Secondary | ICD-10-CM | POA: Diagnosis not present

## 2015-03-12 DIAGNOSIS — Z91040 Latex allergy status: Secondary | ICD-10-CM | POA: Diagnosis not present

## 2015-03-12 DIAGNOSIS — Y9389 Activity, other specified: Secondary | ICD-10-CM | POA: Diagnosis not present

## 2015-03-12 DIAGNOSIS — S62317A Displaced fracture of base of fifth metacarpal bone. left hand, initial encounter for closed fracture: Secondary | ICD-10-CM | POA: Diagnosis not present

## 2015-03-12 DIAGNOSIS — R0789 Other chest pain: Secondary | ICD-10-CM | POA: Diagnosis not present

## 2015-03-12 DIAGNOSIS — E119 Type 2 diabetes mellitus without complications: Secondary | ICD-10-CM | POA: Insufficient documentation

## 2015-03-12 DIAGNOSIS — M79602 Pain in left arm: Secondary | ICD-10-CM | POA: Diagnosis not present

## 2015-03-12 DIAGNOSIS — S62309A Unspecified fracture of unspecified metacarpal bone, initial encounter for closed fracture: Secondary | ICD-10-CM

## 2015-03-12 MED ORDER — ACETAMINOPHEN 500 MG PO TABS
1000.0000 mg | ORAL_TABLET | Freq: Once | ORAL | Status: AC
  Administered 2015-03-12: 1000 mg via ORAL
  Filled 2015-03-12: qty 2

## 2015-03-12 NOTE — ED Notes (Signed)
Patient transported to X-ray 
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
## 2015-03-12 NOTE — ED Notes (Signed)
Attempted to give tylenol X 2. Pt remains in xray.
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
## 2015-03-12 NOTE — ED Notes (Signed)
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
## 2015-03-12 NOTE — Discharge Instructions (Signed)
Cast or Splint Care Casts and splints support injured limbs and keep bones from moving while they heal.  HOME CARE  Keep the cast or splint uncovered during the drying period.  A plaster cast can take 24 to 48 hours to dry.  A fiberglass cast will dry in less than 1 hour.  Do not rest the cast on anything harder than a pillow for 24 hours.  Do not put weight on your injured limb. Do not put pressure on the cast. Wait for your doctor's approval.  Keep the cast or splint dry.  Cover the cast or splint with a plastic bag during baths or wet weather.  If you have a cast over your chest and belly (trunk), take sponge baths until the cast is taken off.  If your cast gets wet, dry it with a towel or blow dryer. Use the cool setting on the blow dryer.  Keep your cast or splint clean. Wash a dirty cast with a damp cloth.  Do not put any objects under your cast or splint.  Do not scratch the skin under the cast with an object. If itching is a problem, use a blow dryer on a cool setting over the itchy area.  Do not trim or cut your cast.  Do not take out the padding from inside your cast.  Exercise your joints near the cast as told by your doctor.  Raise (elevate) your injured limb on 1 or 2 pillows for the first 1 to 3 days. GET HELP IF:  Your cast or splint cracks.  Your cast or splint is too tight or too loose.  You itch badly under the cast.  Your cast gets wet or has a soft spot.  You have a bad smell coming from the cast.  You get an object stuck under the cast.  Your skin around the cast becomes red or sore.  You have new or more pain after the cast is put on. GET HELP RIGHT AWAY IF:  You have fluid leaking through the cast.  You cannot move your fingers or toes.  Your fingers or toes turn blue or white or are cool, painful, or puffy (swollen).  You have tingling or lose feeling (numbness) around the injured area.  You have bad pain or pressure under the  cast.  You have trouble breathing or have shortness of breath.  You have chest pain.   This information is not intended to replace advice given to you by your health care provider. Make sure you discuss any questions you have with your health care provider.   Document Released: 05/13/2010 Document Revised: 09/13/2012 Document Reviewed: 07/20/2012 Elsevier Interactive Patient Education 2016 Elsevier Inc.  Cervical Sprain A cervical sprain is when the tissues (ligaments) that hold the neck bones in place stretch or tear. HOME CARE   Put ice on the injured area.  Put ice in a plastic bag.  Place a towel between your skin and the bag.  Leave the ice on for 15-20 minutes, 3-4 times a day.  You may have been given a collar to wear. This collar keeps your neck from moving while you heal.  Do not take the collar off unless told by your doctor.  If you have long hair, keep it outside of the collar.  Ask your doctor before changing the position of your collar. You may need to change its position over time to make it more comfortable.  If you are allowed to take off  the collar for cleaning or bathing, follow your doctor's instructions on how to do it safely.  Keep your collar clean by wiping it with mild soap and water. Dry it completely. If the collar has removable pads, remove them every 1-2 days to hand wash them with soap and water. Allow them to air dry. They should be dry before you wear them in the collar.  Do not drive while wearing the collar.  Only take medicine as told by your doctor.  Keep all doctor visits as told.  Keep all physical therapy visits as told.  Adjust your work station so that you have good posture while you work.  Avoid positions and activities that make your problems worse.  Warm up and stretch before being active. GET HELP IF:  Your pain is not controlled with medicine.  You cannot take less pain medicine over time as planned.  Your activity  level does not improve as expected. GET HELP RIGHT AWAY IF:   You are bleeding.  Your stomach is upset.  You have an allergic reaction to your medicine.  You develop new problems that you cannot explain.  You lose feeling (become numb) or you cannot move any part of your body (paralysis).  You have tingling or weakness in any part of your body.  Your symptoms get worse. Symptoms include:  Pain, soreness, stiffness, puffiness (swelling), or a burning feeling in your neck.  Pain when your neck is touched.  Shoulder or upper back pain.  Limited ability to move your neck.  Headache.  Dizziness.  Your hands or arms feel week, lose feeling, or tingle.  Muscle spasms.  Difficulty swallowing or chewing. MAKE SURE YOU:   Understand these instructions.  Will watch your condition.  Will get help right away if you are not doing well or get worse.   This information is not intended to replace advice given to you by your health care provider. Make sure you discuss any questions you have with your health care provider.   Document Released: 06/30/2007 Document Revised: 09/13/2012 Document Reviewed: 07/19/2012 Elsevier Interactive Patient Education 2016 [employer].  Chest Contusion A contusion is a deep bruise. Bruises happen when an injury causes bleeding under the skin. Signs of bruising include pain, puffiness (swelling), and discolored skin. The bruise may turn blue, purple, or yellow.  HOME CARE  Put ice on the injured area.  Put ice in a plastic bag.  Place a towel between the skin and the bag.  Leave the ice on for 15-20 minutes at a time, 03-04 times a day for the first 48 hours.  Only take medicine as told by your doctor.  Rest.  Take deep breaths (deep-breathing exercises) as told by your doctor.  Stop smoking if you smoke.  Do not lift objects over 5 pounds (2.3 kilograms) for 3 days or longer if told by your doctor. GET HELP RIGHT AWAY IF:   You  have more bruising or puffiness.  You have pain that gets worse.  You have trouble breathing.  You are dizzy, weak, or pass out (faint).  You have blood in your pee (urine) or poop (stool).  You cough up or throw up (vomit) blood.  Your puffiness or pain is not helped with medicines. MAKE SURE YOU:   Understand these instructions.  Will watch your condition.  Will get help right away if you are not doing well or get worse.   This information is not intended to replace advice given  to you by your health care provider. Make sure you discuss any questions you have with your health care provider.   Document Released: 06/30/2007 Document Revised: 10/06/2011 Document Reviewed: 07/05/2011 Elsevier Interactive Patient Education 2016 Elsevier Inc.  Metacarpal Fracture Fractures of metacarpals are breaks in the bones of the hand. They extend from the knuckles to the wrist. These bones can break in many ways. There are different ways of treating these fractures. HOME CARE  Only exercise as told by your doctor.  Return to activities as told by your doctor.  Go to physical therapy as told by your doctor.  Follow your doctor's advice about driving.  Keep the injured hand raised (elevated) above the level of your heart.  If a plaster, fiberglass, or pre-formed splint was applied:  Wear your splint as told and until you are examined again.  Apply ice on the injury for 15-20 minutes at a time, 03-04 times a day. Put the ice in a plastic bag. Place a towel between your skin and the bag.  Do not get your splint or cast wet. Protect it during bathing with a plastic bag.  Loosen the elastic bandage around the splint if your fingers start to get numb, tingle, get cold, or turn blue.  If the splint is plaster, do not lean it on hard surfaces or put pressure on it for 24 hours after it is put on.  Do not  try to scratch the skin under the cast.  Check the skin around the cast every  day. You may put lotion on red or sore areas.  Move the fingers of your casted hand several times a day.  Only take medicine as told by your doctor.  Follow up as told by your doctor. This is very important in order to avoid permanent injury, disability, or lasting (chronic) pain. GET HELP RIGHT AWAY IF:   You develop a rash.  You have problems breathing.  You have any allergy problems.  You have more than a small spot of blood from beneath your cast or splint.  You have redness, puffiness (swelling), or more pain from beneath your cast or splint.  Yellowish-white fluid (pus) comes from beneath your cast or splint.  You develop a temperature by mouth above 102 F (38.9 C), not controlled by medicine.  You have a bad smell coming from under your cast or splint.  You have problems moving any of your fingers. If you do not have a window in your cast for looking at the wound, a fluid or a little bleeding may show up as a stain on the outside of your cast. Tell your doctor about any stains you see. MAKE SURE YOU:   Understand these instructions.  Will watch your condition.  Will get help right away if you are not doing well or get worse.   This information is not intended to replace advice given to you by your health care provider. Make sure you discuss any questions you have with your health care provider.   Document Released: 06/30/2007 Document Revised: 02/01/2014 Document Reviewed: 10/31/2013 Elsevier Interactive Patient Education 2016 [employer].  Technical brewer It is common to have multiple bruises and sore muscles after a motor vehicle collision (MVC). These tend to feel worse for the first 24 hours. You may have the most stiffness and soreness over the first several hours. You may also feel worse when you wake up the first morning after your collision. After this point, you will  usually begin to improve with each day. The speed of improvement often depends on  the severity of the collision, the number of injuries, and the location and nature of these injuries. HOME CARE INSTRUCTIONS  Put ice on the injured area.  Put ice in a plastic bag.  Place a towel between your skin and the bag.  Leave the ice on for 15-20 minutes, 3-4 times a day, or as directed by your health care provider.  Drink enough fluids to keep your urine clear or pale yellow. Do not drink alcohol.  Take a warm shower or bath once or twice a day. This will increase blood flow to sore muscles.  You may return to activities as directed by your caregiver. Be careful when lifting, as this may aggravate neck or back pain.  Only take over-the-counter or prescription medicines for pain, discomfort, or fever as directed by your caregiver. Do not use aspirin. This may increase bruising and bleeding. SEEK IMMEDIATE MEDICAL CARE IF:  You have numbness, tingling, or weakness in the arms or legs.  You develop severe headaches not relieved with medicine.  You have severe neck pain, especially tenderness in the middle of the back of your neck.  You have changes in bowel or bladder control.  There is increasing pain in any area of the body.  You have shortness of breath, light-headedness, dizziness, or fainting.  You have chest pain.  You feel sick to your stomach (nauseous), throw up (vomit), or sweat.  You have increasing abdominal discomfort.  There is blood in your urine, stool, or vomit.  You have pain in your shoulder (shoulder strap areas).  You feel your symptoms are getting worse. MAKE SURE YOU:  Understand these instructions.  Will watch your condition.  Will get help right away if you are not doing well or get worse.   This information is not intended to replace advice given to you by your health care provider. Make sure you discuss any questions you have with your health care provider.   Document Released: 01/11/2005 Document Revised: 02/01/2014 Document  Reviewed: 06/10/2010 Elsevier Interactive Patient Education [employer].  Your exam and x-ray show strain to the muscles of the neck. You have contusion to the chest and ribs. You have a fracture to the bone of the left hand. You should follow-up with Dr. Mack Guise for ongoing management of your hand fracture. Take Tylenol as needed for pain relief.

## 2015-03-13 DIAGNOSIS — S62357A Nondisplaced fracture of shaft of fifth metacarpal bone, left hand, initial encounter for closed fracture: Secondary | ICD-10-CM | POA: Diagnosis not present

## 2015-03-13 DIAGNOSIS — S139XXA Sprain of joints and ligaments of unspecified parts of neck, initial encounter: Secondary | ICD-10-CM | POA: Diagnosis not present

## 2015-03-17 ENCOUNTER — Telehealth: Payer: Self-pay | Admitting: *Deleted

## 2015-03-17 ENCOUNTER — Ambulatory Visit: Payer: PPO | Admitting: Family Medicine

## 2015-03-17 DIAGNOSIS — G4733 Obstructive sleep apnea (adult) (pediatric): Secondary | ICD-10-CM | POA: Diagnosis not present

## 2015-03-17 NOTE — Telephone Encounter (Signed)
Please call patient with lab results
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
## 2015-03-18 DIAGNOSIS — R768 Other specified abnormal immunological findings in serum: Secondary | ICD-10-CM | POA: Diagnosis not present

## 2015-03-18 DIAGNOSIS — M810 Age-related osteoporosis without current pathological fracture: Secondary | ICD-10-CM | POA: Diagnosis not present

## 2015-03-18 DIAGNOSIS — M19042 Primary osteoarthritis, left hand: Secondary | ICD-10-CM | POA: Diagnosis not present

## 2015-03-18 DIAGNOSIS — M19041 Primary osteoarthritis, right hand: Secondary | ICD-10-CM | POA: Diagnosis not present

## 2015-03-19 DIAGNOSIS — S161XXS Strain of muscle, fascia and tendon at neck level, sequela: Secondary | ICD-10-CM | POA: Diagnosis not present

## 2015-03-19 DIAGNOSIS — M542 Cervicalgia: Secondary | ICD-10-CM | POA: Diagnosis not present

## 2015-03-21 ENCOUNTER — Ambulatory Visit: Payer: PPO | Admitting: Family Medicine

## 2015-03-21 ENCOUNTER — Ambulatory Visit (INDEPENDENT_AMBULATORY_CARE_PROVIDER_SITE_OTHER): Payer: Self-pay | Admitting: Family Medicine

## 2015-03-21 ENCOUNTER — Encounter: Payer: Self-pay | Admitting: Family Medicine

## 2015-03-21 DIAGNOSIS — M5412 Radiculopathy, cervical region: Secondary | ICD-10-CM | POA: Diagnosis not present

## 2015-03-21 DIAGNOSIS — M542 Cervicalgia: Secondary | ICD-10-CM | POA: Diagnosis not present

## 2015-03-21 DIAGNOSIS — R221 Localized swelling, mass and lump, neck: Secondary | ICD-10-CM

## 2015-03-21 DIAGNOSIS — M2550 Pain in unspecified joint: Secondary | ICD-10-CM

## 2015-03-21 DIAGNOSIS — R0789 Other chest pain: Secondary | ICD-10-CM

## 2015-03-21 DIAGNOSIS — R0781 Pleurodynia: Secondary | ICD-10-CM

## 2015-03-21 MED ORDER — TRAMADOL HCL 50 MG PO TABS: 50.0000 mg | ORAL_TABLET | Freq: Two times a day (BID) | ORAL | Status: DC | PRN
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
## 2015-03-24 DIAGNOSIS — M542 Cervicalgia: Secondary | ICD-10-CM | POA: Diagnosis not present

## 2015-03-24 DIAGNOSIS — M6281 Muscle weakness (generalized): Secondary | ICD-10-CM | POA: Diagnosis not present

## 2015-03-26 ENCOUNTER — Telehealth: Payer: Self-pay | Admitting: Family Medicine

## 2015-03-26 DIAGNOSIS — R0781 Pleurodynia: Secondary | ICD-10-CM

## 2015-03-26 NOTE — Telephone Encounter (Signed)
I ordered a CT scan of her chest, please schedule and let patient know date/time.
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
## 2015-03-27 NOTE — Telephone Encounter (Signed)
Patient notified, CT scheduled on March 9 @9 :00

## 2015-03-28 DIAGNOSIS — M545 Low back pain: Secondary | ICD-10-CM | POA: Diagnosis not present

## 2015-03-28 DIAGNOSIS — S161XXS Strain of muscle, fascia and tendon at neck level, sequela: Secondary | ICD-10-CM | POA: Diagnosis not present

## 2015-03-28 DIAGNOSIS — S39012D Strain of muscle, fascia and tendon of lower back, subsequent encounter: Secondary | ICD-10-CM | POA: Diagnosis not present

## 2015-03-28 DIAGNOSIS — M542 Cervicalgia: Secondary | ICD-10-CM | POA: Diagnosis not present

## 2015-03-28 DIAGNOSIS — S62309D Unspecified fracture of unspecified metacarpal bone, subsequent encounter for fracture with routine healing: Secondary | ICD-10-CM | POA: Diagnosis not present

## 2015-04-01 DIAGNOSIS — M5412 Radiculopathy, cervical region: Secondary | ICD-10-CM | POA: Diagnosis not present

## 2015-04-01 DIAGNOSIS — M542 Cervicalgia: Secondary | ICD-10-CM | POA: Diagnosis not present

## 2015-04-02 DIAGNOSIS — G4733 Obstructive sleep apnea (adult) (pediatric): Secondary | ICD-10-CM | POA: Diagnosis not present

## 2015-04-03 ENCOUNTER — Ambulatory Visit: Payer: PPO

## 2015-04-03 DIAGNOSIS — M542 Cervicalgia: Secondary | ICD-10-CM | POA: Diagnosis not present

## 2015-04-03 DIAGNOSIS — M5412 Radiculopathy, cervical region: Secondary | ICD-10-CM | POA: Diagnosis not present

## 2015-04-07 ENCOUNTER — Encounter: Payer: Self-pay | Admitting: Dietician

## 2015-04-10 DIAGNOSIS — M6281 Muscle weakness (generalized): Secondary | ICD-10-CM | POA: Diagnosis not present

## 2015-04-11 ENCOUNTER — Encounter: Payer: Self-pay | Admitting: Family Medicine

## 2015-04-11 ENCOUNTER — Ambulatory Visit (INDEPENDENT_AMBULATORY_CARE_PROVIDER_SITE_OTHER): Payer: Self-pay | Admitting: Family Medicine

## 2015-04-11 VITALS — BP 126/60 | HR 72 | Temp 98.1°F | Resp 18 | Ht 62.0 in | Wt 149.1 lb

## 2015-04-11 DIAGNOSIS — M6281 Muscle weakness (generalized): Secondary | ICD-10-CM | POA: Diagnosis not present

## 2015-04-11 DIAGNOSIS — S20219A Contusion of unspecified front wall of thorax, initial encounter: Secondary | ICD-10-CM | POA: Insufficient documentation

## 2015-04-11 DIAGNOSIS — S20212D Contusion of left front wall of thorax, subsequent encounter: Secondary | ICD-10-CM

## 2015-04-11 MED ORDER — TRAMADOL HCL 50 MG PO TABS: 50.0000 mg | ORAL_TABLET | Freq: Two times a day (BID) | ORAL | Status: DC | PRN

## 2015-04-11 MED ORDER — CYCLOBENZAPRINE HCL 5 MG PO TABS
5.0000 mg | ORAL_TABLET | Freq: Three times a day (TID) | ORAL | Status: DC | PRN
Start: 2015-04-11 — End: 2015-06-25
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
## 2015-04-15 ENCOUNTER — Other Ambulatory Visit: Payer: Self-pay

## 2015-04-15 DIAGNOSIS — F419 Anxiety disorder, unspecified: Secondary | ICD-10-CM

## 2015-04-15 DIAGNOSIS — M5412 Radiculopathy, cervical region: Secondary | ICD-10-CM | POA: Diagnosis not present

## 2015-04-15 DIAGNOSIS — M542 Cervicalgia: Secondary | ICD-10-CM | POA: Diagnosis not present

## 2015-04-17 DIAGNOSIS — M436 Torticollis: Secondary | ICD-10-CM | POA: Diagnosis not present

## 2015-04-17 DIAGNOSIS — M542 Cervicalgia: Secondary | ICD-10-CM | POA: Diagnosis not present

## 2015-04-22 ENCOUNTER — Encounter: Payer: Self-pay | Admitting: Licensed Clinical Social Worker

## 2015-04-22 ENCOUNTER — Ambulatory Visit (INDEPENDENT_AMBULATORY_CARE_PROVIDER_SITE_OTHER): Payer: PPO | Admitting: Licensed Clinical Social Worker

## 2015-04-22 DIAGNOSIS — S62357D Nondisplaced fracture of shaft of fifth metacarpal bone, left hand, subsequent encounter for fracture with routine healing: Secondary | ICD-10-CM | POA: Diagnosis not present

## 2015-04-22 DIAGNOSIS — F331 Major depressive disorder, recurrent, moderate: Secondary | ICD-10-CM

## 2015-04-22 DIAGNOSIS — F4310 Post-traumatic stress disorder, unspecified: Secondary | ICD-10-CM | POA: Diagnosis not present

## 2015-04-22 DIAGNOSIS — M542 Cervicalgia: Secondary | ICD-10-CM | POA: Diagnosis not present

## 2015-04-22 DIAGNOSIS — M5412 Radiculopathy, cervical region: Secondary | ICD-10-CM | POA: Diagnosis not present

## 2015-04-22 DIAGNOSIS — S62309D Unspecified fracture of unspecified metacarpal bone, subsequent encounter for fracture with routine healing: Secondary | ICD-10-CM | POA: Diagnosis not present
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
## 2015-04-24 DIAGNOSIS — M5412 Radiculopathy, cervical region: Secondary | ICD-10-CM | POA: Diagnosis not present

## 2015-04-24 DIAGNOSIS — M542 Cervicalgia: Secondary | ICD-10-CM | POA: Diagnosis not present

## 2015-04-28 DIAGNOSIS — E119 Type 2 diabetes mellitus without complications: Secondary | ICD-10-CM | POA: Diagnosis not present

## 2015-04-28 DIAGNOSIS — E7800 Pure hypercholesterolemia, unspecified: Secondary | ICD-10-CM | POA: Diagnosis not present

## 2015-04-28 DIAGNOSIS — M810 Age-related osteoporosis without current pathological fracture: Secondary | ICD-10-CM | POA: Diagnosis not present

## 2015-04-28 DIAGNOSIS — I10 Essential (primary) hypertension: Secondary | ICD-10-CM | POA: Diagnosis not present

## 2015-04-29 DIAGNOSIS — M542 Cervicalgia: Secondary | ICD-10-CM | POA: Diagnosis not present

## 2015-04-29 DIAGNOSIS — M5412 Radiculopathy, cervical region: Secondary | ICD-10-CM | POA: Diagnosis not present

## 2015-05-02 ENCOUNTER — Other Ambulatory Visit: Payer: Self-pay | Admitting: Cardiovascular Disease

## 2015-05-06 DIAGNOSIS — M5412 Radiculopathy, cervical region: Secondary | ICD-10-CM | POA: Diagnosis not present

## 2015-05-06 DIAGNOSIS — M542 Cervicalgia: Secondary | ICD-10-CM | POA: Diagnosis not present

## 2015-05-08 DIAGNOSIS — M6281 Muscle weakness (generalized): Secondary | ICD-10-CM | POA: Diagnosis not present

## 2015-05-12 DIAGNOSIS — M542 Cervicalgia: Secondary | ICD-10-CM | POA: Diagnosis not present

## 2015-05-12 DIAGNOSIS — M5412 Radiculopathy, cervical region: Secondary | ICD-10-CM | POA: Diagnosis not present

## 2015-05-19 DIAGNOSIS — Z794 Long term (current) use of insulin: Secondary | ICD-10-CM | POA: Diagnosis not present

## 2015-05-19 DIAGNOSIS — E119 Type 2 diabetes mellitus without complications: Secondary | ICD-10-CM | POA: Diagnosis not present

## 2015-05-20 DIAGNOSIS — M25512 Pain in left shoulder: Secondary | ICD-10-CM | POA: Diagnosis not present

## 2015-05-20 DIAGNOSIS — M546 Pain in thoracic spine: Secondary | ICD-10-CM | POA: Diagnosis not present

## 2015-05-20 DIAGNOSIS — M5412 Radiculopathy, cervical region: Secondary | ICD-10-CM | POA: Diagnosis not present

## 2015-05-20 DIAGNOSIS — M542 Cervicalgia: Secondary | ICD-10-CM | POA: Diagnosis not present

## 2015-05-20 DIAGNOSIS — M25511 Pain in right shoulder: Secondary | ICD-10-CM | POA: Diagnosis not present

## 2015-05-22 DIAGNOSIS — M5412 Radiculopathy, cervical region: Secondary | ICD-10-CM | POA: Diagnosis not present

## 2015-05-22 DIAGNOSIS — M542 Cervicalgia: Secondary | ICD-10-CM | POA: Diagnosis not present

## 2015-05-26 DIAGNOSIS — R5382 Chronic fatigue, unspecified: Secondary | ICD-10-CM | POA: Diagnosis not present

## 2015-05-26 DIAGNOSIS — E119 Type 2 diabetes mellitus without complications: Secondary | ICD-10-CM | POA: Diagnosis not present

## 2015-05-27 DIAGNOSIS — M542 Cervicalgia: Secondary | ICD-10-CM | POA: Diagnosis not present

## 2015-05-27 DIAGNOSIS — M5412 Radiculopathy, cervical region: Secondary | ICD-10-CM | POA: Diagnosis not present

## 2015-05-29 ENCOUNTER — Ambulatory Visit: Payer: PPO | Admitting: Cardiovascular Disease

## 2015-06-02 DIAGNOSIS — M5412 Radiculopathy, cervical region: Secondary | ICD-10-CM | POA: Diagnosis not present

## 2015-06-02 DIAGNOSIS — M542 Cervicalgia: Secondary | ICD-10-CM | POA: Diagnosis not present

## 2015-06-05 DIAGNOSIS — M5412 Radiculopathy, cervical region: Secondary | ICD-10-CM | POA: Diagnosis not present

## 2015-06-05 DIAGNOSIS — M542 Cervicalgia: Secondary | ICD-10-CM | POA: Diagnosis not present

## 2015-06-10 DIAGNOSIS — M5412 Radiculopathy, cervical region: Secondary | ICD-10-CM | POA: Diagnosis not present

## 2015-06-10 DIAGNOSIS — M542 Cervicalgia: Secondary | ICD-10-CM | POA: Diagnosis not present

## 2015-06-13 DIAGNOSIS — M5412 Radiculopathy, cervical region: Secondary | ICD-10-CM | POA: Diagnosis not present

## 2015-06-13 DIAGNOSIS — M542 Cervicalgia: Secondary | ICD-10-CM | POA: Diagnosis not present

## 2015-06-16 ENCOUNTER — Ambulatory Visit: Payer: PPO | Admitting: Cardiovascular Disease

## 2015-06-16 ENCOUNTER — Encounter: Payer: Self-pay | Admitting: *Deleted

## 2015-06-18 DIAGNOSIS — G4733 Obstructive sleep apnea (adult) (pediatric): Secondary | ICD-10-CM | POA: Diagnosis not present

## 2015-06-19 DIAGNOSIS — M542 Cervicalgia: Secondary | ICD-10-CM | POA: Diagnosis not present

## 2015-06-19 DIAGNOSIS — Z72821 Inadequate sleep hygiene: Secondary | ICD-10-CM | POA: Diagnosis not present

## 2015-06-19 DIAGNOSIS — G4733 Obstructive sleep apnea (adult) (pediatric): Secondary | ICD-10-CM | POA: Diagnosis not present

## 2015-06-19 DIAGNOSIS — M5412 Radiculopathy, cervical region: Secondary | ICD-10-CM | POA: Diagnosis not present

## 2015-06-24 DIAGNOSIS — M542 Cervicalgia: Secondary | ICD-10-CM | POA: Diagnosis not present

## 2015-06-24 DIAGNOSIS — M5412 Radiculopathy, cervical region: Secondary | ICD-10-CM | POA: Diagnosis not present

## 2015-06-25 ENCOUNTER — Ambulatory Visit (INDEPENDENT_AMBULATORY_CARE_PROVIDER_SITE_OTHER): Payer: PPO | Admitting: Cardiovascular Disease

## 2015-06-25 ENCOUNTER — Encounter: Payer: Self-pay | Admitting: Cardiovascular Disease

## 2015-06-25 VITALS — BP 136/68 | HR 59 | Ht 63.0 in | Wt 144.8 lb

## 2015-06-25 DIAGNOSIS — E785 Hyperlipidemia, unspecified: Secondary | ICD-10-CM | POA: Diagnosis not present

## 2015-06-25 DIAGNOSIS — I10 Essential (primary) hypertension: Secondary | ICD-10-CM

## 2015-06-25 DIAGNOSIS — I6523 Occlusion and stenosis of bilateral carotid arteries: Secondary | ICD-10-CM

## 2015-06-25 DIAGNOSIS — I272 Other secondary pulmonary hypertension: Secondary | ICD-10-CM

## 2015-06-25 DIAGNOSIS — E119 Type 2 diabetes mellitus without complications: Secondary | ICD-10-CM | POA: Diagnosis not present

## 2015-06-25 NOTE — Patient Instructions (Signed)
You are doing well. No medication changes were made.  Please call us if you have new issues that need to be addressed before your next appt.  Your physician wants you to follow-up in: 12 months.  You will receive a reminder letter in the mail two months in advance. If you don't receive a letter, please call our office to schedule the follow-up appointment. 

## 2015-06-25 NOTE — Progress Notes (Signed)
Patient ID: Yvonne Davidson, female   DOB: 1940/06/09, 75 y.o.   MRN: [id number]  Cardiology Office Note  Date:  06/25/2015   ID:  Yvonne Davidson, DOB 24-Nov-1940, MRN [id number]  PCP:  Bobetta Lime, MD   Chief Complaint  Patient presents with  . other    6 Month F/U.  Medications verbally reviewed with patient.       HPI Comments: Yvonne Davidson is a 75 year old woman with a history of hypertension, obstructive sleep apnea who is followed by the sleep Center at [facility], chronic pain in her legs, GERD, hypertension. 60-79% carotid disease on the left, 40-59% disease on the right, hyperlipidemia She presents for follow-up of her blood pressure and carotid disease  In follow-up, she reports that she is doing well, denies any significant shortness of breath or chest pain No regular exercise program Most recent hemoglobin A1c 6.6, managed by Dr. Gabriel Carina Trying to watch her diet She has seen sleep apnea specialist at [place] and wears nasal pillow with a chin strap, mouthguard She takes 2 doses of clonidine, 1 in the late evening, one overnight to help her sleep  Tolerating Crestor 5 mg daily, takes other various herbs over-the-counter  EKG shows sinus bradycardia with rate 59 bpm, no significant ST or T-wave changes  Other past medical history For obstructive sleep apnea, she wears a mouth piece designed by [facility] dentistry .  she has chronic aching in her legs. She attributes this to sleep apnea and poor blood supply to her legs  She  retired at the age of 68. She was working in [employer]. She was unable to maintain the pace with such poor sleep on a chronic basis    Past medical history : Chronic sinusitis; Hashimoto's thyroiditis; Allergy; Arthritis; Hyperlipidemia; Osteoporosis; PONV (postoperative nausea and vomiting); Hypertension; OSA (obstructive sleep apnea); Diverticulosis; Urinary frequency; Anemia; History of blood transfusion; Insomnia; Diabetes  mellitus without complication ([place]); Bilateral cataracts; Dry eyes; Anxiety; Depression; and GERD (gastroesophageal reflux disease).  Allergies:   Buprenorphine hcl; Codeine; Mold extract; Morphine and related; Oxycodone; Oxycodone hcl; Oxycodone hcl; Sumycin; Chlorphen-phenyleph-asa; Decongest-aid; Esomeprazole magnesium; Esomeprazole magnesium; Hydrochlorothiazide; Indomethacin; Procaine hcl; Keflex; Latex; Neomycin-bacitracin zn-polymyx; Pseudoephedrine hcl er; Sudafed pe cold & cough child ; Telithromycin; and Tetracycline   Social History:  The patient  reports that she has never smoked. She has never used smokeless tobacco. She reports that she does not drink alcohol or use illicit drugs.   Family History:   family history includes Alcohol abuse in her father; Arthritis in her mother; Cancer in her mother; Early death in her father; Heart disease in her mother; Hyperlipidemia in her mother; Hypertension in her mother; Stroke in her mother.    Current Outpatient Prescriptions  Medication Sig Dispense Refill  . Artificial Tear Ointment (REFRESH LACRI-LUBE) OINT Apply 1 application to eye at bedtime.    . B-D ULTRA-FINE 33 LANCETS MISC Use as directed. Dx 250.0    . Blood Glucose Monitoring Suppl (FIFTY50 GLUCOSE METER 2.0) W/DEVICE KIT USE AS DIRECTED    . Blood Glucose Monitoring Suppl (ONE TOUCH ULTRA MINI) W/DEVICE KIT USE AS DIRECTED 1 each 0  . calcium carbonate (OS-CAL - DOSED IN MG OF ELEMENTAL CALCIUM) 1250 (500 CA) MG tablet Take by mouth.    . carboxymethylcellulose (REFRESH PLUS) 0.5 % SOLN Place 1 drop into both eyes 3 (three) times daily as needed (for dry eyes).     . Cholecalciferol (VITAMIN D3) 1000 UNITS  CAPS Take by mouth.    . cloNIDine (CATAPRES) 0.1 MG tablet TAKE 1 TABLET BY MOUTH 3 TIMES DAILY. (Patient taking differently: 2 (two) times daily. TAKE 1 TABLET BY MOUTH 3 TIMES DAILY.) 270 tablet 3  . Cyanocobalamin 1000 MCG TBCR Take by mouth.    Marland Kitchen glucose blood (CHOICE DM  FORA G20 TEST STRIPS) test strip Use as instructed 100 each 12  . glucose blood test strip Use as instructed 100 each 12  . losartan (COZAAR) 100 MG tablet TAKE 1 TABLET BY MOUTH EVERY MORNING. 90 tablet 0  . metFORMIN (GLUCOPHAGE) 500 MG tablet Take 1 tablet (500 mg total) by mouth 2 (two) times daily with a meal. (Patient taking differently: Take 500 mg by mouth daily. ) 60 tablet 3  . ONETOUCH DELICA LANCETS 98P MISC Use as directed. Dx 250.0 100 each 3  . rosuvastatin (CRESTOR) 5 MG tablet Take 1 tablet (5 mg total) by mouth daily. 90 tablet 4   No current facility-administered medications for this visit.    Review of Systems: Review of Systems  Constitutional: Negative.   Respiratory: Negative.   Cardiovascular: Negative.   Gastrointestinal: Negative.   Musculoskeletal: Negative.   Neurological: Negative.   Psychiatric/Behavioral: Negative.   All other systems reviewed and are negative.   PHYSICAL EXAM: VS:  BP 136/68 mmHg  Pulse 59  Ht _0  (1.6 m)  Wt 144 lb 12.8 oz (65.681 kg)  BMI 25.66 kg/m2 , BMI Body mass index is 25.66 kg/(m^2). GEN: Well nourished, well developed, in no acute distress HEENT: normal Neck: no JVD, carotid bruits, or masses Cardiac: RRR; no murmurs, rubs, or gallops,no edema  Respiratory:  clear to auscultation bilaterally, normal work of breathing GI: soft, nontender, nondistended, + BS MS: no deformity or atrophy Skin: warm and dry, no rash Neuro:  Strength and sensation are intact Psych: euthymic mood, full affect   Recent Labs: 01/24/2015: Hemoglobin 13.5 02/13/2015: BUN 13; Creatinine, Ser 0.80; Platelets 304; Potassium 4.7; Sodium 141; TSH 4.280 03/03/2015: ALT 16    Lipid Panel  Lab Results  Component Value Date   CHOL 126 03/03/2015   HDL 47 03/03/2015   LDLCALC 67 03/03/2015   TRIG 62 03/03/2015    Wt Readings from Last 3 Encounters:  06/25/15 144 lb 12.8 oz (65.681 kg)  04/11/15 149 lb 1 oz (67.614 kg)  03/21/15 148 lb 1.6  oz (67.178 kg)       ASSESSMENT AND PLAN:  1) Controlled type 2 diabetes mellitus without complication, without long-term current use of insulin (HCC) We have stressed the importance of low carbohydrates, weight loss She does have underlying obesity, recommended regular exercise program  2) Hypertension goal BP (blood pressure) < 140/90 - Plan: EKG 12-Lead Blood pressure stable, no further medication changes made  3) Carotid stenosis, bilateral - Plan: EKG 12-Lead Stable disease on recent ultrasound, results discussed with her Repeat ultrasound in one year  4) Hyperlipidemia LDL goal <70 Cholesterol currently at goal, recommended she stay on her Crestor   Disposition:   F/U  one year    Orders Placed This Encounter  Procedures  . EKG 12-Lead       Signed, Esmond Plants, M.D., Ph.D. 06/25/2015  [place], [place]

## 2015-06-26 DIAGNOSIS — M5412 Radiculopathy, cervical region: Secondary | ICD-10-CM | POA: Diagnosis not present

## 2015-06-26 DIAGNOSIS — M542 Cervicalgia: Secondary | ICD-10-CM | POA: Diagnosis not present

## 2015-07-03 DIAGNOSIS — M542 Cervicalgia: Secondary | ICD-10-CM | POA: Diagnosis not present

## 2015-07-03 DIAGNOSIS — M5412 Radiculopathy, cervical region: Secondary | ICD-10-CM | POA: Diagnosis not present

## 2015-07-04 DIAGNOSIS — M5412 Radiculopathy, cervical region: Secondary | ICD-10-CM | POA: Diagnosis not present

## 2015-07-04 DIAGNOSIS — M542 Cervicalgia: Secondary | ICD-10-CM | POA: Diagnosis not present

## 2015-07-07 DIAGNOSIS — M542 Cervicalgia: Secondary | ICD-10-CM | POA: Diagnosis not present

## 2015-07-07 DIAGNOSIS — M5412 Radiculopathy, cervical region: Secondary | ICD-10-CM | POA: Diagnosis not present

## 2015-07-09 DIAGNOSIS — M5412 Radiculopathy, cervical region: Secondary | ICD-10-CM | POA: Diagnosis not present

## 2015-07-09 DIAGNOSIS — M542 Cervicalgia: Secondary | ICD-10-CM | POA: Diagnosis not present

## 2015-07-15 ENCOUNTER — Telehealth: Payer: Self-pay | Admitting: Cardiovascular Disease

## 2015-07-15 NOTE — Telephone Encounter (Signed)
Patient called and wants to know if release form has been signed so she can go hiking. Please fax to [facility] and call patient so she knows this has been done.

## 2015-07-15 NOTE — Telephone Encounter (Signed)
Letter was typed and given to pt at [facility] 06/25/15.  Will reroute to [facility].

## 2015-07-22 DIAGNOSIS — M79642 Pain in left hand: Secondary | ICD-10-CM | POA: Diagnosis not present

## 2015-07-24 ENCOUNTER — Encounter: Payer: Self-pay | Admitting: Cardiovascular Disease

## 2015-07-24 NOTE — Telephone Encounter (Signed)
This encounter was created in error - please disregard.

## 2015-07-30 ENCOUNTER — Other Ambulatory Visit: Payer: Self-pay | Admitting: Cardiovascular Disease

## 2015-07-30 DIAGNOSIS — I6523 Occlusion and stenosis of bilateral carotid arteries: Secondary | ICD-10-CM

## 2015-08-06 ENCOUNTER — Ambulatory Visit: Payer: PPO | Attending: Orthopedic Surgery | Admitting: Occupational Therapy

## 2015-08-06 DIAGNOSIS — M25632 Stiffness of left wrist, not elsewhere classified: Secondary | ICD-10-CM | POA: Diagnosis not present

## 2015-08-06 DIAGNOSIS — M25642 Stiffness of left hand, not elsewhere classified: Secondary | ICD-10-CM | POA: Diagnosis not present

## 2015-08-06 DIAGNOSIS — M79642 Pain in left hand: Secondary | ICD-10-CM | POA: Diagnosis not present

## 2015-08-06 DIAGNOSIS — M6281 Muscle weakness (generalized): Secondary | ICD-10-CM | POA: Insufficient documentation

## 2015-08-06 DIAGNOSIS — M25532 Pain in left wrist: Secondary | ICD-10-CM | POA: Diagnosis not present

## 2015-08-06 NOTE — Patient Instructions (Addendum)
contrast Use CMC neoprene splint with activities - ed on using  AROM for thumb PA and RA , circles And opposition to all digits - slide down 5th   8 reps each  Can do composite PROM for 5th - pain less than 2/10   Wrist flexion and extention PROM  AROM flexion and exention of wrist  10 reps   2 x day
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
## 2015-08-07 ENCOUNTER — Ambulatory Visit: Payer: PPO

## 2015-08-07 ENCOUNTER — Other Ambulatory Visit: Payer: Self-pay

## 2015-08-07 DIAGNOSIS — I6523 Occlusion and stenosis of bilateral carotid arteries: Secondary | ICD-10-CM

## 2015-08-07 MED ORDER — LOSARTAN POTASSIUM 100 MG PO TABS: 100.0000 mg | ORAL_TABLET | Freq: Every morning | ORAL | Status: DC

## 2015-08-07 NOTE — Telephone Encounter (Signed)
Refill sent for Losartan potassium 100 mg

## 2015-08-07 NOTE — Telephone Encounter (Signed)
Patient brought in Medical Clearance form from [facility] and [employer]creation for hiking.  Placed in nurse box.  Please fax to [phone] attention [facility]

## 2015-08-07 NOTE — Telephone Encounter (Signed)
Release form stamped and faxed to the requested number along with the letter that was done 06/25/15. Original copies placed in Faxed bin on Pamela's desk.

## 2015-08-12 ENCOUNTER — Ambulatory Visit: Payer: PPO | Admitting: Occupational Therapy

## 2015-08-12 DIAGNOSIS — M25632 Stiffness of left wrist, not elsewhere classified: Secondary | ICD-10-CM

## 2015-08-12 DIAGNOSIS — M25642 Stiffness of left hand, not elsewhere classified: Secondary | ICD-10-CM

## 2015-08-12 DIAGNOSIS — M6281 Muscle weakness (generalized): Secondary | ICD-10-CM

## 2015-08-12 DIAGNOSIS — M79642 Pain in left hand: Secondary | ICD-10-CM

## 2015-08-12 DIAGNOSIS — M25532 Pain in left wrist: Secondary | ICD-10-CM
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
## 2015-08-12 NOTE — Patient Instructions (Addendum)
Same HEP  

## 2015-08-13 DIAGNOSIS — G4733 Obstructive sleep apnea (adult) (pediatric): Secondary | ICD-10-CM | POA: Diagnosis not present

## 2015-08-14 ENCOUNTER — Encounter: Payer: PPO | Admitting: Occupational Therapy

## 2015-08-14 DIAGNOSIS — E119 Type 2 diabetes mellitus without complications: Secondary | ICD-10-CM | POA: Diagnosis not present

## 2015-08-14 DIAGNOSIS — M19071 Primary osteoarthritis, right ankle and foot: Secondary | ICD-10-CM | POA: Diagnosis not present

## 2015-08-14 DIAGNOSIS — G8929 Other chronic pain: Secondary | ICD-10-CM | POA: Diagnosis not present

## 2015-08-14 DIAGNOSIS — M85471 Solitary bone cyst, right ankle and foot: Secondary | ICD-10-CM | POA: Diagnosis not present

## 2015-08-14 DIAGNOSIS — M79671 Pain in right foot: Secondary | ICD-10-CM | POA: Diagnosis not present

## 2015-08-20 ENCOUNTER — Ambulatory Visit: Payer: PPO | Admitting: Occupational Therapy

## 2015-08-20 DIAGNOSIS — M6281 Muscle weakness (generalized): Secondary | ICD-10-CM

## 2015-08-20 DIAGNOSIS — M25642 Stiffness of left hand, not elsewhere classified: Secondary | ICD-10-CM | POA: Diagnosis not present

## 2015-08-20 DIAGNOSIS — M25632 Stiffness of left wrist, not elsewhere classified: Secondary | ICD-10-CM

## 2015-08-20 DIAGNOSIS — M25532 Pain in left wrist: Secondary | ICD-10-CM

## 2015-08-20 DIAGNOSIS — M79642 Pain in left hand: Secondary | ICD-10-CM
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
## 2015-08-20 NOTE — Patient Instructions (Signed)
fluido  Use CMC neoprene splint with activities - ed on using   AROM for thumb PA and RA  Tendon glides - AAROM full fist  opposition to all digits - slide down 5th  With less pain  After PROM and AAROM for thumb IP and MP flexion , and composite flexion of thumb 10 reps    Wrist flexion PROM over armrest and extention prayer stretch  PROM - reviewed again - pt did not do at home  AROM flexion and exention of wrist  AROM for RD and UD  AROM sup/pro 10 reps  Reviewed with pt to modify activities - enlarge  Handles and use larger joints - until 5th with less pain

## 2015-08-21 DIAGNOSIS — E7800 Pure hypercholesterolemia, unspecified: Secondary | ICD-10-CM | POA: Diagnosis not present

## 2015-08-21 DIAGNOSIS — M79671 Pain in right foot: Secondary | ICD-10-CM | POA: Diagnosis not present

## 2015-08-21 DIAGNOSIS — I10 Essential (primary) hypertension: Secondary | ICD-10-CM | POA: Diagnosis not present

## 2015-08-21 DIAGNOSIS — M810 Age-related osteoporosis without current pathological fracture: Secondary | ICD-10-CM | POA: Diagnosis not present

## 2015-08-21 DIAGNOSIS — M84374A Stress fracture, right foot, initial encounter for fracture: Secondary | ICD-10-CM | POA: Diagnosis not present

## 2015-08-28 ENCOUNTER — Ambulatory Visit: Payer: PPO | Attending: Orthopedic Surgery | Admitting: Occupational Therapy

## 2015-08-28 DIAGNOSIS — Z0000 Encounter for general adult medical examination without abnormal findings: Secondary | ICD-10-CM | POA: Diagnosis not present

## 2015-08-28 DIAGNOSIS — E119 Type 2 diabetes mellitus without complications: Secondary | ICD-10-CM | POA: Diagnosis not present

## 2015-08-28 DIAGNOSIS — M25632 Stiffness of left wrist, not elsewhere classified: Secondary | ICD-10-CM | POA: Insufficient documentation

## 2015-08-28 DIAGNOSIS — M6281 Muscle weakness (generalized): Secondary | ICD-10-CM | POA: Diagnosis not present

## 2015-08-28 DIAGNOSIS — Z780 Asymptomatic menopausal state: Secondary | ICD-10-CM | POA: Diagnosis not present

## 2015-08-28 DIAGNOSIS — M25642 Stiffness of left hand, not elsewhere classified: Secondary | ICD-10-CM | POA: Diagnosis not present

## 2015-08-28 DIAGNOSIS — I10 Essential (primary) hypertension: Secondary | ICD-10-CM | POA: Diagnosis not present

## 2015-08-28 DIAGNOSIS — E7800 Pure hypercholesterolemia, unspecified: Secondary | ICD-10-CM | POA: Diagnosis not present

## 2015-08-28 DIAGNOSIS — M25532 Pain in left wrist: Secondary | ICD-10-CM | POA: Insufficient documentation

## 2015-08-28 DIAGNOSIS — M79642 Pain in left hand: Secondary | ICD-10-CM | POA: Insufficient documentation

## 2015-08-28 NOTE — Patient Instructions (Signed)
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
## 2015-09-03 ENCOUNTER — Ambulatory Visit: Payer: PPO | Admitting: Occupational Therapy

## 2015-09-03 DIAGNOSIS — M25642 Stiffness of left hand, not elsewhere classified: Secondary | ICD-10-CM

## 2015-09-03 DIAGNOSIS — M25532 Pain in left wrist: Secondary | ICD-10-CM

## 2015-09-03 DIAGNOSIS — M25632 Stiffness of left wrist, not elsewhere classified: Secondary | ICD-10-CM

## 2015-09-03 DIAGNOSIS — M6281 Muscle weakness (generalized): Secondary | ICD-10-CM

## 2015-09-03 DIAGNOSIS — M79642 Pain in left hand: Secondary | ICD-10-CM

## 2015-09-03 NOTE — Patient Instructions (Signed)
Pt to cont with modify activities - enlarge Handles and use larger joints - as well as avoid tight grip  until 5th with less pain
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
## 2015-09-09 ENCOUNTER — Ambulatory Visit: Payer: PPO | Admitting: Physical Therapy

## 2015-09-09 DIAGNOSIS — M79671 Pain in right foot: Secondary | ICD-10-CM | POA: Diagnosis not present

## 2015-09-10 ENCOUNTER — Ambulatory Visit: Payer: PPO | Admitting: Physical Therapy

## 2015-09-15 DIAGNOSIS — Z780 Asymptomatic menopausal state: Secondary | ICD-10-CM | POA: Diagnosis not present

## 2015-09-17 ENCOUNTER — Encounter: Payer: PPO | Admitting: Occupational Therapy

## 2015-09-17 DIAGNOSIS — M19041 Primary osteoarthritis, right hand: Secondary | ICD-10-CM | POA: Diagnosis not present

## 2015-09-17 DIAGNOSIS — M19042 Primary osteoarthritis, left hand: Secondary | ICD-10-CM | POA: Diagnosis not present

## 2015-09-17 DIAGNOSIS — M85471 Solitary bone cyst, right ankle and foot: Secondary | ICD-10-CM | POA: Diagnosis not present

## 2015-09-17 DIAGNOSIS — M79671 Pain in right foot: Secondary | ICD-10-CM | POA: Diagnosis not present

## 2015-09-17 DIAGNOSIS — G8929 Other chronic pain: Secondary | ICD-10-CM | POA: Diagnosis not present

## 2015-09-18 ENCOUNTER — Ambulatory Visit: Payer: PPO | Admitting: Occupational Therapy

## 2015-09-18 DIAGNOSIS — M6281 Muscle weakness (generalized): Secondary | ICD-10-CM

## 2015-09-18 DIAGNOSIS — M79642 Pain in left hand: Secondary | ICD-10-CM

## 2015-09-18 DIAGNOSIS — M25642 Stiffness of left hand, not elsewhere classified: Secondary | ICD-10-CM | POA: Diagnosis not present

## 2015-09-18 DIAGNOSIS — M25532 Pain in left wrist: Secondary | ICD-10-CM

## 2015-09-18 DIAGNOSIS — M25632 Stiffness of left wrist, not elsewhere classified: Secondary | ICD-10-CM

## 2015-09-18 NOTE — Patient Instructions (Signed)
Measurements taken  Grip and ROM - see flowsheet Discuss with her plans after discharge and home program  Done PRWHE for pain  And function
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
## 2015-09-23 DIAGNOSIS — S92301D Fracture of unspecified metatarsal bone(s), right foot, subsequent encounter for fracture with routine healing: Secondary | ICD-10-CM | POA: Diagnosis not present

## 2015-09-23 DIAGNOSIS — S62347K Nondisplaced fracture of base of fifth metacarpal bone. left hand, subsequent encounter for fracture with nonunion: Secondary | ICD-10-CM | POA: Diagnosis not present

## 2015-10-02 DIAGNOSIS — G4733 Obstructive sleep apnea (adult) (pediatric): Secondary | ICD-10-CM | POA: Diagnosis not present

## 2015-10-10 DIAGNOSIS — E119 Type 2 diabetes mellitus without complications: Secondary | ICD-10-CM | POA: Diagnosis not present

## 2015-10-10 DIAGNOSIS — R5382 Chronic fatigue, unspecified: Secondary | ICD-10-CM | POA: Diagnosis not present

## 2015-11-06 DIAGNOSIS — M810 Age-related osteoporosis without current pathological fracture: Secondary | ICD-10-CM | POA: Diagnosis not present

## 2015-11-06 DIAGNOSIS — S62347K Nondisplaced fracture of base of fifth metacarpal bone. left hand, subsequent encounter for fracture with nonunion: Secondary | ICD-10-CM | POA: Diagnosis not present

## 2015-11-06 DIAGNOSIS — E119 Type 2 diabetes mellitus without complications: Secondary | ICD-10-CM | POA: Diagnosis not present

## 2015-11-25 DIAGNOSIS — E119 Type 2 diabetes mellitus without complications: Secondary | ICD-10-CM | POA: Diagnosis not present

## 2015-11-25 DIAGNOSIS — E7800 Pure hypercholesterolemia, unspecified: Secondary | ICD-10-CM | POA: Diagnosis not present

## 2015-11-25 DIAGNOSIS — I10 Essential (primary) hypertension: Secondary | ICD-10-CM | POA: Diagnosis not present

## 2015-12-02 DIAGNOSIS — E7800 Pure hypercholesterolemia, unspecified: Secondary | ICD-10-CM | POA: Diagnosis not present

## 2015-12-02 DIAGNOSIS — I10 Essential (primary) hypertension: Secondary | ICD-10-CM | POA: Diagnosis not present

## 2015-12-09 DIAGNOSIS — G4733 Obstructive sleep apnea (adult) (pediatric): Secondary | ICD-10-CM | POA: Diagnosis not present

## 2015-12-11 DIAGNOSIS — G4733 Obstructive sleep apnea (adult) (pediatric): Secondary | ICD-10-CM | POA: Diagnosis not present

## 2015-12-12 DIAGNOSIS — E119 Type 2 diabetes mellitus without complications: Secondary | ICD-10-CM | POA: Diagnosis not present

## 2015-12-23 ENCOUNTER — Telehealth: Payer: Self-pay | Admitting: Cardiovascular Disease

## 2015-12-23 NOTE — Telephone Encounter (Signed)
Left message for pt to call back  °

## 2015-12-23 NOTE — Telephone Encounter (Signed)
This encounter was created in error - please disregard.

## 2015-12-23 NOTE — Telephone Encounter (Signed)
Spoke w/ pt.  She reports that her BP has been running high: Systolic [id number] in the am. She stopped her amlodipine on her own several months ago, as she has been taking beet juice supplements w/ good results. She was taking the pills, but they became so expensive that she switched to the powder; the dosage is harder to determine on the powder.  She takes 1/2 - 1 tbsp each day.  Contains a mild nitrate and has been regulating her BP pretty well until recently. She is concerned that if she attempts to increase the amount of powder, that it will send her BG up. Advised her that beet juice pills/powder are not FDA regulated, but I will do some research and see how much sugar is in the supplement.   Pt has OSA, but decreased the pressure on her CPAP, as the "air was going straight down my airway and distending my stomach".   Pt's husband is going through a difficult time, though pt did not elaborate. She does not have a support network or anyone to talk to at the moment, reports a great deal of stress and asks if we have any suggestions for support. She does attend church, but it has gotten so big that she no longer feels a sense of belonging. Invited pt to attend church w/ myself.  She is extremely excited about this and will speak w/ her husband about this.   She would like to know if she should resume her amlodipine to try to pull her BP down.  She was previously on 10 mg once daily.   Advised her that I will make Dr. Rockey Situ aware and see if he recommends she resume 10 mg dosing or start her back on a lower dose. She is appreciative and will await to hear back from him before resuming.

## 2015-12-23 NOTE — Telephone Encounter (Signed)
Pt states she needs to start back on her Amlodipine. States her BP is "running high again".  This morning 168/60, last night- 168/65,11/26-170/68, 11/25-"close to 200".  Please call.

## 2015-12-23 NOTE — Telephone Encounter (Signed)
No answer. Left message to call back.   

## 2015-12-25 NOTE — Telephone Encounter (Signed)
Would restart amlodipine Continue to monitor blood pressure

## 2015-12-26 MED ORDER — AMLODIPINE BESYLATE 10 MG PO TABS: 10.0000 mg | ORAL_TABLET | Freq: Every day | ORAL | 6 refills | Status: DC

## 2015-12-26 NOTE — Telephone Encounter (Signed)
What dose do you want to start her back on? She was previously on 10 mg.

## 2015-12-26 NOTE — Telephone Encounter (Signed)
Okay to start back on 10 mg daily

## 2015-12-26 NOTE — Telephone Encounter (Signed)
Spoke w/ pt.  Advised her of Dr. Donivan Scull recommendation.  Advised her that I attempted to do some research on the beet juice powder, but I do not know the brands she uses and could not find a reliable source to find the glucose/carb of it. She reports that she uses different brands and cannot find a consistency, either. She will resume the amlodipine 10 mg once daily, and will adjust this based on her BP. Asked her to call back w/ any further questions or concerns.

## 2016-01-01 DIAGNOSIS — M79642 Pain in left hand: Secondary | ICD-10-CM | POA: Diagnosis not present

## 2016-01-01 DIAGNOSIS — S62347K Nondisplaced fracture of base of fifth metacarpal bone. left hand, subsequent encounter for fracture with nonunion: Secondary | ICD-10-CM | POA: Diagnosis not present

## 2016-01-08 DIAGNOSIS — M79642 Pain in left hand: Secondary | ICD-10-CM | POA: Diagnosis not present

## 2016-02-19 DIAGNOSIS — D225 Melanocytic nevi of trunk: Secondary | ICD-10-CM | POA: Diagnosis not present

## 2016-02-19 DIAGNOSIS — L821 Other seborrheic keratosis: Secondary | ICD-10-CM | POA: Diagnosis not present

## 2016-02-19 DIAGNOSIS — Z85828 Personal history of other malignant neoplasm of skin: Secondary | ICD-10-CM | POA: Diagnosis not present

## 2016-02-19 DIAGNOSIS — D2261 Melanocytic nevi of right upper limb, including shoulder: Secondary | ICD-10-CM | POA: Diagnosis not present

## 2016-02-25 DIAGNOSIS — G4733 Obstructive sleep apnea (adult) (pediatric): Secondary | ICD-10-CM | POA: Diagnosis not present

## 2016-03-18 ENCOUNTER — Other Ambulatory Visit: Payer: Self-pay | Admitting: Cardiovascular Disease

## 2016-03-23 DIAGNOSIS — G4733 Obstructive sleep apnea (adult) (pediatric): Secondary | ICD-10-CM | POA: Diagnosis not present

## 2016-03-26 DIAGNOSIS — G4733 Obstructive sleep apnea (adult) (pediatric): Secondary | ICD-10-CM | POA: Diagnosis not present

## 2016-04-06 DIAGNOSIS — R5381 Other malaise: Secondary | ICD-10-CM | POA: Diagnosis not present

## 2016-04-06 DIAGNOSIS — F418 Other specified anxiety disorders: Secondary | ICD-10-CM | POA: Diagnosis not present

## 2016-04-06 DIAGNOSIS — R5383 Other fatigue: Secondary | ICD-10-CM | POA: Diagnosis not present

## 2016-05-25 DIAGNOSIS — E7800 Pure hypercholesterolemia, unspecified: Secondary | ICD-10-CM | POA: Diagnosis not present

## 2016-05-25 DIAGNOSIS — Z0000 Encounter for general adult medical examination without abnormal findings: Secondary | ICD-10-CM | POA: Diagnosis not present

## 2016-05-27 DIAGNOSIS — Z9989 Dependence on other enabling machines and devices: Secondary | ICD-10-CM | POA: Diagnosis not present

## 2016-05-27 DIAGNOSIS — R5382 Chronic fatigue, unspecified: Secondary | ICD-10-CM | POA: Diagnosis not present

## 2016-05-27 DIAGNOSIS — E119 Type 2 diabetes mellitus without complications: Secondary | ICD-10-CM | POA: Diagnosis not present

## 2016-05-27 DIAGNOSIS — M8589 Other specified disorders of bone density and structure, multiple sites: Secondary | ICD-10-CM | POA: Diagnosis not present

## 2016-05-27 DIAGNOSIS — E7800 Pure hypercholesterolemia, unspecified: Secondary | ICD-10-CM | POA: Diagnosis not present

## 2016-05-27 DIAGNOSIS — G4733 Obstructive sleep apnea (adult) (pediatric): Secondary | ICD-10-CM | POA: Diagnosis not present

## 2016-05-27 DIAGNOSIS — Z79899 Other long term (current) drug therapy: Secondary | ICD-10-CM | POA: Diagnosis not present

## 2016-05-27 DIAGNOSIS — I10 Essential (primary) hypertension: Secondary | ICD-10-CM | POA: Diagnosis not present

## 2016-06-04 ENCOUNTER — Encounter: Payer: Self-pay | Admitting: Neurology

## 2016-06-15 DIAGNOSIS — G4733 Obstructive sleep apnea (adult) (pediatric): Secondary | ICD-10-CM | POA: Diagnosis not present

## 2016-06-17 ENCOUNTER — Ambulatory Visit (INDEPENDENT_AMBULATORY_CARE_PROVIDER_SITE_OTHER): Payer: PPO | Admitting: Neurology

## 2016-06-17 ENCOUNTER — Encounter: Payer: Self-pay | Admitting: Neurology

## 2016-06-17 ENCOUNTER — Encounter (INDEPENDENT_AMBULATORY_CARE_PROVIDER_SITE_OTHER): Payer: Self-pay

## 2016-06-17 VITALS — BP 133/65 | HR 64 | Resp 20 | Ht 62.0 in | Wt 148.0 lb

## 2016-06-17 DIAGNOSIS — G4733 Obstructive sleep apnea (adult) (pediatric): Secondary | ICD-10-CM | POA: Diagnosis not present

## 2016-06-17 DIAGNOSIS — G47411 Narcolepsy with cataplexy: Secondary | ICD-10-CM | POA: Diagnosis not present

## 2016-06-17 DIAGNOSIS — G4719 Other hypersomnia: Secondary | ICD-10-CM | POA: Insufficient documentation

## 2016-06-17 DIAGNOSIS — Q382 Macroglossia: Secondary | ICD-10-CM

## 2016-06-17 DIAGNOSIS — R6889 Other general symptoms and signs: Secondary | ICD-10-CM

## 2016-06-17 DIAGNOSIS — Z9989 Dependence on other enabling machines and devices: Secondary | ICD-10-CM | POA: Diagnosis not present

## 2016-06-17 DIAGNOSIS — F518 Other sleep disorders not due to a substance or known physiological condition: Secondary | ICD-10-CM | POA: Diagnosis not present

## 2016-06-17 NOTE — Progress Notes (Signed)
SLEEP MEDICINE CLINIC   Provider:  Larey Seat, [place] D  Primary Care Physician:  Bobetta Lime, MD   Referring Provider: Bobetta Lime, MD    Chief Complaint  Patient presents with  . New Patient (Initial Visit)    wants a dreamwear FFM mask, did not bring cpap    HPI:  Yvonne Davidson is a 76 y.o. female , seen here as in a referral/ revisit  from Dr. Nadine Counts for a re-evaluation of her sleep .  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 She struggles with significant fatigue and excessive daytime sleepiness endorsing the Epworth sleepiness score at 20 points. She had undergone an evaluation with Dr. 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 She endorsed insomnia, sleepiness, snoring, restless legs, fatigue, palpitations, hearing loss, allergic rhinitis, decreased energy and the feeling of never getting enough sleep. She sleeps with her head of bed elevated- she tries to sleep actually in a seated position to allow her to breathe deeper. She would describes that she gulps air swallows air, she was evaluated for sleep apnea and has been compliant with CPAP use. She was diagnosed with sleep apnea first in the year 2000 after she had been told that she was a loud snorer. She was referred to Dr. Annamaria Boots he did a polysomnography at the time and she was told that her obstructive sleep apnea may be in moderate range. She started on CPAP but she had aerophagia. This continued to be a problem in spite of trying CPAP repeatedly and was different interfaces.  She has tried an oral appliance - actually two. None of them treated the snoring sufficiently and none of them gave her energy or refreshing restorative sleep.  She no longer sleeps with her husband due to her snoring. Whenever she is physically inactive and mentally not stimulated, she will go to sleep. She can recall being asleep within seconds even when she was young, very slender  and physically very active.  Her sleep at the time was of good quality and she would usually wake up refreshed but she craves naps even than.She would have dream hallucinations and dream intrusions hypnagogic or hypnopompic, sleep paralysis. 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 Her dreams are vivid and may continue and several installments after she wakes up. The most scary dreams all those when she feels the presence of another being in the room but cannot move to get away. These have affected her all her life.  She struggles with depression, hypertension and diabetes she is status post sinus surgery in 1991 and 2006 and had an abdominal cholecystectomy in 2015.  Sleep habits are as follows: Usually she is in bed by 10 PM the latest at 11. She sleeps in a recliner not in bed. She has not been gulping air while on CPAP when seated. She can usually sleep 4-5 hours. She feels that her breathing stops not just at night but also in the middle of the day. She has been scared to sleep. She tends to hold her breath when she concentrates on something. She can go to sleep fast but she does not stay asleep she keeps waking up in intervals is rarely asleep on block for longer than 2 hours, she wakes up about every 90 minutes or so. She has 2  bathroom breaks on average each night she rises in the morning at 8 AM and she keeps this is a regular rise time in spite of not having had the same sleep at night. Naps every day 30 minutes.   Sleep medical history and family sleep history:  She is not aware of any sleep disorders affecting her siblings or parents, her father drank and snored-  . She underwent 2 sinus surgeries, she did not undergo tonsillectomy. She has macroglossia. She was a sleep walker.  She has been diagnosed with obstructive sleep apnea for the first time in the year 2000 and then has been  worked up by Dr. Nehemiah Settle again. She was for a while followed at [facility]. 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 The overall AHI was 14.6 but there was loud snoring associated. There was no Cheyne-Stokes breathing noted she had an equal amount of obstructive and central apneas. Oxygen nadir SPO2 was 87% her respiratory events were clearly worse in supine sleep and she did have frequent periodic limb movements she had a normal sleep efficiency and normal REM sleep but sleep was fragmented with respiratory arousals. Her EKG remained in normal sinus rhythm. Dr. Radene Knee brought that the patient had previously failed to tolerate CPAP and was at that time not able to try CPAP again he referred for the dental appliance. Region used for oral appliance for a while while sleeping in a recliner she also used a chin strap and CPAP at the time, truly uncomfortable. She reached a compliance of 97% on CPAP with 7 hours and 17 minutes nocturnal use, the pressure was set at 7.5 cm with 2 cm EPR and the residual AHI was 0.8 which is a good result but the patient did not sleep. Dr. Maxwell Caul then changed her to an AutoSet between 4 and 16 cm water with 2 cm EPR, the 95th percentile was 14 cm water, residual AHI was 1.7 without treatment emergent central apnea. She did not have major air leaks. 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   Social history:  Married, retired, non smoker- quit 45 years ago. ETOH - one beer a month. Caffeine -  Hot tea 2 cups a day.   Review of Systems: Out of a complete 14 system review, the patient complains of only the following symptoms, and all other reviewed systems are negative. Snoring,  dreams, cataplexy, sleep attacks, OSA. Palpitations.    Epworth score 20 , Fatigue severity score 59 , depression score 6/15   Social History   Social History  . Marital status: Married    Spouse name: N/A  . Number of children: 4  . Years of education: N/A   Occupational History  . EM [employer]    Social History Main Topics  . Smoking status: Never Smoker  . Smokeless tobacco: Never Used     Comment: quit in 1978  . Alcohol use No  . Drug use: No  . Sexual activity: Not on file   Other Topics Concern  . Not on file   Social History Narrative   Lives with friend. Has 4 children.    Family History  Problem Relation Age of Onset  .  Arthritis Mother   . Heart disease Mother   . Hyperlipidemia Mother   . Hypertension Mother   . Stroke Mother   . Cancer Mother        Breast & Uterine - 20'-30's  . Early death Father        Fire  . Alcohol abuse Father     Past Medical History:  Diagnosis Date  . Allergy   . Anemia   . Anxiety   . Arthritis   . Bilateral cataracts    immature  . Chronic sinusitis    takes Cetirizine daily  . Depression    doesn't take any meds  . Diabetes mellitus without complication (HCC)    borderline  . Diverticulosis   . Dry eyes   . GERD (gastroesophageal reflux disease)    TAKES OMEPRAZOLE DAILY  . Hashimoto's thyroiditis   . History of blood transfusion    in the 60's no abnormal reaction noted  . Hyperlipidemia    doesn't take any meds for this  . Hypertension    takes Losartan and Clonidine nightly  . Insomnia    takes Trazodone nightly  . OSA (obstructive sleep apnea)    doesn't use a cpap  . Osteoporosis    takes Vit D as needed  . PONV (postoperative nausea and vomiting)    hard to wake up  . Urinary frequency     Past Surgical History:  Procedure Laterality Date  . CHOLECYSTECTOMY  12/07/2012   Dr Lucia Gaskins  . CHOLECYSTECTOMY N/A 12/07/2012   Procedure: LAPAROSCOPIC CHOLECYSTECTOMY WITH  INTRAOPERATIVE CHOLANGIOGRAM;  Surgeon: Shann Medal, MD;  Location: [facility];  Service: General;  Laterality: N/A;  . LIPOSUCTION    . NASAL SINUS SURGERY  2006   x 2  . right arm surgery  1960  . TCS      Current Outpatient Prescriptions  Medication Sig Dispense Refill  . Artificial Tear Ointment (REFRESH LACRI-LUBE) OINT Apply 1 application to eye at bedtime.    . B-D ULTRA-FINE 33 LANCETS MISC Use as directed. Dx 250.0    . Blood Glucose Monitoring Suppl (FIFTY50 GLUCOSE METER 2.0) W/DEVICE KIT USE AS DIRECTED    . Blood Glucose Monitoring Suppl (ONE TOUCH ULTRA MINI) W/DEVICE KIT USE AS DIRECTED 1 each 0  . calcium carbonate (OS-CAL - DOSED IN MG OF ELEMENTAL CALCIUM) 1250 (500 CA) MG tablet Take by mouth.    . carboxymethylcellulose (REFRESH PLUS) 0.5 % SOLN Place 1 drop into both eyes 3 (three) times daily as needed (for dry eyes).     . Cholecalciferol (VITAMIN D3) 1000 UNITS CAPS Take by mouth.    . cloNIDine (CATAPRES) 0.1 MG tablet TAKE 1 TABLET BY MOUTH 3 TIMES DAILY. (Patient taking differently: 2 (two) times daily. TAKE 1 TABLET BY MOUTH 3 TIMES DAILY.) 270 tablet 3  . cloNIDine (CATAPRES) 0.1 MG tablet TAKE 1 TABLET BY MOUTH 3 TIMES DAILY. 270 tablet 2  . Cyanocobalamin 1000 MCG TBCR Take by mouth.    Marland Kitchen glucose blood (CHOICE DM FORA G20 TEST STRIPS) test strip Use as instructed 100 each 12  . glucose blood test strip Use as instructed 100 each 12  . losartan (COZAAR) 100 MG tablet Take 1 tablet (100 mg total) by mouth every morning. 90 tablet 3  . metFORMIN (GLUCOPHAGE) 500 MG tablet Take 1 tablet (500 mg total) by mouth 2 (two) times daily with a meal. (Patient taking differently: Take 500 mg by mouth daily. ) 60  tablet 3  . ONETOUCH DELICA LANCETS 23F MISC Use as directed. Dx 250.0 100 each 3   No current facility-administered medications for this visit.     Allergies as of 06/17/2016 - Review Complete 06/17/2016  Allergen Reaction Noted  . Buprenorphine hcl Anaphylaxis  07/01/2014  . Codeine Shortness Of Breath   . Mold extract [trichophyton mentagrophyte] Shortness Of Breath 04/28/2011  . Morphine and related Anaphylaxis 12/12/2012  . Oxycodone Shortness Of Breath 07/01/2014  . Oxycodone hcl Anaphylaxis   . Oxycodone hcl Anaphylaxis 07/01/2014  . Sumycin [tetracycline hcl] Anaphylaxis 04/28/2011  . Chlorphen-phenyleph-asa  07/01/2014  . Decongest-aid [pseudoephedrine]  03/14/2013  . Esomeprazole magnesium Other (See Comments)   . Esomeprazole magnesium  07/01/2014  . Hydrochlorothiazide Other (See Comments) 04/28/2011  . Indomethacin Other (See Comments)   . Procaine hcl Other (See Comments) 04/28/2011  . Keflex [cephalexin] Rash 05/17/2012  . Latex Rash 11/29/2012  . Neomycin-bacitracin zn-polymyx Rash 04/28/2011  . Pseudoephedrine hcl er Palpitations 04/28/2011  . Sudafed pe cold & cough child  [phenylephrine-dm] Palpitations 07/01/2014  . Telithromycin Rash   . Tetracycline Rash     Vitals: BP 133/65   Pulse 64   Resp 20   Ht '5\' 2"'  (1.575 m)   Wt 148 lb (67.1 kg)   BMI 27.07 kg/m  Last Weight:  Wt Readings from Last 1 Encounters:  06/17/16 148 lb (67.1 kg)   TDD:UKGU mass index is 27.07 kg/m.     Last Height:   Ht Readings from Last 1 Encounters:  06/17/16 '5\' 2"'  (1.575 m)    Physical exam:  General: The patient is awake, alert and appears not in acute distress. The patient is well groomed. Head: Normocephalic, atraumatic. Neck is supple. Mallampati 5 with macroglossia. ,  neck circumference:15 . Nasal airflow patent , TMJ-right click  evident . Retrognathia is not seen.  Cardiovascular:  Regular rate and rhythm , without  murmurs or carotid bruit, and without distended neck veins. Respiratory: Lungs are clear to auscultation. Skin:  Without evidence of edema, or rash Trunk: BMI is 27 . Neurologic exam : The patient is awake and alert, oriented to place and time.   Memory subjective described as intact. Attention span &  concentration ability appears normal.  Speech is fluent,  without  dysarthria, dysphonia or aphasia.  Mood and affect are appropriate.  Cranial nerves: Pupils are equal and briskly reactive to light. Funduscopic exam without evidence of pallor or edema. Extraocular movements  in vertical and horizontal planes intact and without nystagmus. Visual fields by finger perimetry are intact. Hearing to finger rub intact.  Facial sensation intact to fine touch. Facial motor strength is symmetric and tongue and uvula move midline. Shoulder shrug was symmetrical.   Motor exam:   Normal tone, muscle bulk and symmetric strength in all extremities. Sensory:  Fine touch, pinprick and vibration - Proprioception tested in the upper extremities was normal. Coordination: Rapid alternating movements and  Finger-to-nose maneuver  normal without evidence of ataxia, dysmetria or tremor. Gait and station: Patient walks without assistive device and is able unassisted to climb up to the exam table. Strength within normal limits.Stance is stable and normal.   Deep tendon reflexes: in the  upper and lower extremities are symmetric and intact. Babinski maneuver response is downgoing.  Assessment:  After physical and neurologic examination, review of laboratory studies,  Personal review of imaging studies, reports of other /same  Imaging studies, results of polysomnography and / or neurophysiology testing and  pre-existing records as far as provided in visit., my assessment is   1) the patient has been diagnosed with sleep apnea a few years ago, had a painful [place] to finally get the apnea under control and learn to accept CPAP. Dental device alone didn't work, she needed a chinstrap for while she is now using a full face mask and her apnea is alleviated.   She still has problems not sleeping well at night, described vivid dreams that are sometimes so intrusive that she has difficulties differentiating reality from dream. Some  of her dream hallucinations before paralyzed, and when she takes a deep loud laughter she may find herself weak and  her knees will buckle.  In contrast to her low residual AHI stance her above described sleep experience nightly and the residual Epworth score of 20 points. 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 She is not on antidepressants, only on antihypertensive medication metformin for diabetes, clonidine and losartan. I will order a narcolepsy panel today, this is an HLA test. In addition I will order an overnight sleep study she will bring her on CPAP at her current settings, purpose is to document that she got sleep and then invite her the next day to stay for a 5 nap study. [place] room .   The patient was advised of the nature of the diagnosed disorder , the treatment options and the  risks for general health and wellness arising from not treating the condition.   I spent more than 60 minutes of face to face time with the patient.  Greater than 50% of time was spent in counseling and coordination of care. We have discussed the diagnosis and differential and I answered the patient's questions.    Plan:  Treatment plan and additional workup :  HLA test, narcolepsy evaluation with PSG on CPAP with parasomnia montage- followed by MSLT.   Larey Seat, MD 1/79/1995, 79:00 AM  Certified in Neurology by ABPN Certified in [place] by [facility] [address] [place], [place] [address]

## 2016-06-17 NOTE — Patient Instructions (Signed)
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 We may have to try different medications that may help you stay awake during the day. Not everything works with everybody the same way. Wake promoting agents include stimulants and non-stimulant type medications. The most common side effects with stimulants are weight loss, insomnia, nervousness, headaches, palpitations, rise in blood pressure, anxiety. Stimulants can be addictive and subject to abuse. Non-stimulant type wake promoting medications include Provigil and Nuvigil, most common side effects include headaches, nervousness, insomnia, hypertension. In addition there is a medication called Xyrem which has been proven to be very effective in patients with narcolepsy with or without cataplexy. Some patients with narcolepsy report episodes of weakness, such as jaw or facial weakness, legs giving out, feeling wobbly or like "Jell-o", etc. in situations of anxiety, stress, laughter, sudden sadness, surprise, etc., which is called cataplexy. You can also experience episodes of sleep paralysis during which you may feel unable to move upon awakening. Some people experience dreamlike sequences upon awakening or upon drifting off to sleep, called hypnopompic or hypnagogic hallucinations.      Narcolepsy Narcolepsy is a neurological disorder that causes you to fall asleep suddenly, and without control, during the daytime (sleep attacks). Narcolepsy is a lifelong (chronic) disorder. Normally, sleep follows a regular cycle over the course of the night. After about 90 minutes of light sleep, your sleep should become deeper. When your sleep becomes deeper, your body moves less and you start dreaming. This type of deep sleep is called rapid eye movement (REM) sleep. When you have narcolepsy, your REM sleep is  not well-regulated. This disrupts your sleep cycle, which causes daytime sleepiness. What are the causes? The cause of narcolepsy is not fully understood, but it may be related to:  Low levels of hypocretin, a chemical (neurotransmitter) in the brain that controls sleep and wake cycles. Hypocretin imbalance may be caused by:  Abnormal genes that are passed from parent to child (inherited).  The body's defense system (immune system) attacking hypocretin brain cells (autoimmune disease).  Infection, tumor, or injury in the area of the brain that controls sleep.  Exposure to poisons (toxins), such as heavy metals, pesticides, and secondhand smoke. What are the signs or symptoms? Symptoms of this condition include:  Excessive daytime sleepiness. This is the most common symptom and is usually the first symptom you will notice. This may affect your performance at work or school.  Sleep attacks. This means falling asleep suddenly and without control. You may fall asleep in the middle of an activity, especially low-energy activities like reading or watching TV.  Feeling like you cannot think clearly.  Trouble focusing or remembering things.  Feeling depressed.  Sudden muscle weakness (cataplexy). When this occurs, your speech may become slurred, or your knees may buckle. Cataplexy is usually triggered by surprise, anger, fear, or laughter.  Loss of the ability to speak or move (sleep paralysis). This may occur just as you start to fall asleep or wake up. You will be aware of the paralysis. It usually lasts for just a few seconds or minutes.  Seeing, hearing, tasting, smelling, or feeling things that are not real (hallucinations). Hallucinations may occur with sleep paralysis. They can happen when you are falling asleep, waking up, or dozing.  Trouble staying asleep at night (insomnia).  Restless sleep. How is this  diagnosed? This condition may be diagnosed based on:  A physical exam to  rule out any other problems that may be causing your symptoms.You may be asked to write down your sleeping patterns for several weeks in a sleep diary. This will help your health care provider make a diagnosis.  Sleep studies that measure how well your REM sleep is regulated. These tests also measure your heart rate, breathing, movement, and brain waves. These tests include:  An overnight sleep study (polysomnogram).  A daytime sleep study that is done while you take several naps during the day (multiple sleep latency test, MSLT). This test measures how quickly you fall asleep and how quickly you enter REM sleep.  Removal of spinal fluid to measure hypocretin levels. How is this treated? There is no cure for this condition, but treatment can help relieve symptoms. Treatment may include:  Lifestyle and sleeping strategies to help you cope with the condition, such as:  Exercising regularly.  Maintaining a regular sleep schedule.  Avoiding caffeine and large meals before bed.  Medicines. These may include:  Medicines that help keep you awake and alert (stimulants) to fight daytime sleepiness.  Medicines that treat depression (antidepressants). These may be used to treat cataplexy.  Sodium oxybate. This is a strong medicine to help you relax (sedative) that you may take at night. It can help control daytime sleepiness and cataplexy. Follow these instructions at home: Sleeping habits   Get about 8 hours of sleep every night.  Go to sleep and get up at about the same time every day.  Keep your bedroom dark, quiet, and comfortable.  When you feel very tired, take short naps. Schedule naps so that you take them at about the same time every day.  Tell your employer or teachers that you have narcolepsy. You may be able to adjust your schedule to include time for naps.  Before bedtime:  Avoid bright lights and screens.  Relax. Try activities like reading or taking a warm  bath. Activity   Get at least 20 minutes of exercise every day. This will help you sleep better at night and reduce daytime sleepiness.  Avoid exercising within 3 hours of bedtime.  If you are sleepy, do not drive or use heavy machinery.  If possible, take a nap before driving.  Do not swim or go out on the water without a life jacket. Eating and drinking   Do not drink alcohol or caffeinated beverages within 4-5 hours of bedtime.  Do not eat a lot of food before bedtime. Eat meals at about the same times every day. General instructions   Take over-the-counter and prescription medicines only as told by your health care provider.  If directed, keep a sleep diary.  Tell your employer or teachers that you have narcolepsy. You may be able to adjust your schedule to include time for naps.  Do not use any products that contain nicotine or tobacco, such as cigarettes and e-cigarettes. If you need help quitting, ask your health care provider.  Keep all follow-up visits as told by your health care provider. This is important. Contact a health care provider if:  Your symptoms are not getting better.  You have increasingly high blood pressure (hypertension).  You have changes in your heart rhythm.  You are having a hard time determining what is real and what is not (psychosis). Get help right away if:  You hurt yourself during a sleep attack or an attack of cataplexy.  You  have chest pain.  You have trouble breathing. This information is not intended to replace advice given to you by your health care provider. Make sure you discuss any questions you have with your health care provider. Document Released: 01/01/2002 Document Revised: 01/05/2016 Document Reviewed: 01/05/2016 Elsevier Interactive Patient Education  2017 [employer].

## 2016-06-17 NOTE — Addendum Note (Signed)
Addended by: Lester [place] A on: 06/17/2016 12:24 PM   Modules accepted: Orders

## 2016-06-17 NOTE — Progress Notes (Signed)
Drew pt's blood for HLA test. Pt tolerated well, no problems, asceptic technique was used. Blood sent to [facility] per protocol. Pt left offce in NAD.

## 2016-06-27 NOTE — Progress Notes (Signed)
Cardiology Office Note  Date:  06/29/2016   ID:  [name], DOB March 20, 1940, MRN [id number]  PCP:  Dion Body, MD   Chief Complaint  Patient presents with  . OTHER    12 month f/u c/o mold exposure and issues with BP medication. Meds reviewed verbally with pt.    HPI:  Yvonne Davidson is a 76 year old woman with a history of  60-79% carotid disease on the left, 40-59% disease on the right,  Hyperlipidemia DM hypertension,  obstructive sleep apnea who is followed at [facility],  chronic pain in her legs,  GERD,  She presents for follow-up of her blood pressure and carotid disease  High pressures in the PM, evening 614 systolic Takes clonidine TID starting dinner, before  Bed, middle of the night At 3 AM (she sets her alarm to go off in the middle of the night to take her clonidine ) Amlodipine 2.5 Losartan AM  She stopped Crestor, cholesterol from 120 now up to 200  Significant stressors recently, mold in her attic Having sinus congestion, coughing, bronchitis per the patient Now on antibiotics  Denies  significant shortness of breath or chest pain No regular exercise program hemoglobin A1c 6.6, managed by Dr. Gabriel Carina  sleep apnea, wears nasal pillow with a chin strap, mouthguard  EKG shows sinus bradycardia with rate 61 bpm, no significant ST or T-wave changes  Other past medical history For obstructive sleep apnea, she wears a mouth piece designed by [facility] dentistry .  she has chronic aching in her legs. She attributes this to sleep apnea and poor blood supply to her legs  She  retired at the age of 5. She was working in [employer]. She was unable to maintain the pace with such poor sleep on a chronic basis  PMH:   has a past medical history of Allergy; Anemia; Anxiety; Arthritis; Bilateral cataracts; Chronic sinusitis; Depression; Diabetes mellitus without complication ([place]); Diverticulosis; Dry eyes; GERD (gastroesophageal reflux disease);  Hashimoto's thyroiditis; History of blood transfusion; Hyperlipidemia; Hypertension; Insomnia; OSA (obstructive sleep apnea); Osteoporosis; PONV (postoperative nausea and vomiting); and Urinary frequency.  PSH:    Past Surgical History:  Procedure Laterality Date  . CHOLECYSTECTOMY  12/07/2012   Dr Lucia Gaskins  . CHOLECYSTECTOMY N/A 12/07/2012   Procedure: LAPAROSCOPIC CHOLECYSTECTOMY WITH INTRAOPERATIVE CHOLANGIOGRAM;  Surgeon: Shann Medal, MD;  Location: [place];  Service: General;  Laterality: N/A;  . LIPOSUCTION    . NASAL SINUS SURGERY  2006   x 2  . right arm surgery  1960  . TCS      Current Outpatient Prescriptions  Medication Sig Dispense Refill  . Artificial Tear Ointment (REFRESH LACRI-LUBE) OINT Apply 1 application to eye at bedtime.    . B-D ULTRA-FINE 33 LANCETS MISC Use as directed. Dx 250.0    . Blood Glucose Monitoring Suppl (FIFTY50 GLUCOSE METER 2.0) W/DEVICE KIT USE AS DIRECTED    . Blood Glucose Monitoring Suppl (ONE TOUCH ULTRA MINI) W/DEVICE KIT USE AS DIRECTED 1 each 0  . calcium carbonate (OS-CAL - DOSED IN MG OF ELEMENTAL CALCIUM) 1250 (500 CA) MG tablet Take by mouth.    . carboxymethylcellulose (REFRESH PLUS) 0.5 % SOLN Place 1 drop into both eyes 3 (three) times daily as needed (for dry eyes).     . Cholecalciferol (VITAMIN D3) 1000 UNITS CAPS Take by mouth.    . cloNIDine (CATAPRES) 0.1 MG tablet TAKE 1 TABLET BY MOUTH 3 TIMES DAILY. (Patient taking differently: 2 (two) times  daily. TAKE 1 TABLET BY MOUTH 3 TIMES DAILY.) 270 tablet 3  . Cyanocobalamin 1000 MCG TBCR Take by mouth.    Marland Kitchen glucose blood (CHOICE DM FORA G20 TEST STRIPS) test strip Use as instructed 100 each 12  . losartan (COZAAR) 100 MG tablet Take 1 tablet (100 mg total) by mouth every morning. 90 tablet 3  . metFORMIN (GLUCOPHAGE) 500 MG tablet Take 1 tablet (500 mg total) by mouth 2 (two) times daily with a meal. (Patient taking differently: Take 500 mg by mouth daily. ) 60 tablet 3  . ONETOUCH  DELICA LANCETS 75Z MISC Use as directed. Dx 250.0 100 each 3   No current facility-administered medications for this visit.      Allergies:   Buprenorphine hcl; Codeine; Mold extract [trichophyton mentagrophyte]; Morphine and related; Oxycodone; Oxycodone hcl; Oxycodone hcl; Sumycin [tetracycline hcl]; Chlorphen-phenyleph-asa; Decongest-aid [pseudoephedrine]; Esomeprazole magnesium; Esomeprazole magnesium; Hydrochlorothiazide; Indomethacin; Procaine hcl; Keflex [cephalexin]; Latex; Neomycin-bacitracin zn-polymyx; Pseudoephedrine hcl er; Sudafed pe cold & cough child  [phenylephrine-dm]; Telithromycin; and Tetracycline   Social History:  The patient  reports that she has never smoked. She has never used smokeless tobacco. She reports that she does not drink alcohol or use drugs.   Family History:   family history includes Alcohol abuse in her father; Arthritis in her mother; Cancer in her mother; Early death in her father; Heart disease in her mother; Hyperlipidemia in her mother; Hypertension in her mother; Stroke in her mother.    Review of Systems: Review of Systems  Constitutional: Negative.   Respiratory: Positive for cough.   Cardiovascular: Negative.   Gastrointestinal: Negative.   Musculoskeletal: Negative.   Neurological: Negative.   Psychiatric/Behavioral: The patient is nervous/anxious.   All other systems reviewed and are negative.    PHYSICAL EXAM: VS:  BP (!) 120/58 (BP Location: Left Arm, Patient Position: Sitting, Cuff Size: Normal)   Pulse 61   Ht 5' 2.5" (1.588 m)   Wt 144 lb 12 oz (65.7 kg)   BMI 26.05 kg/m  , BMI Body mass index is 26.05 kg/m. GEN: Well nourished, well developed, in no acute distress  HEENT: normal , Itchy red eyes, coughing  Neck: no JVD, carotid bruits, or masses Cardiac: RRR; no murmurs, rubs, or gallops,no edema  Respiratory:  clear to auscultation bilaterally, normal work of breathing GI: soft, nontender, nondistended, + BS MS: no  deformity or atrophy  Skin: warm and dry, no rash Neuro:  Strength and sensation are intact Psych: euthymic mood, full affect    Recent Labs: No results found for requested labs within last 8760 hours.    Lipid Panel Lab Results  Component Value Date   CHOL 126 03/03/2015   HDL 47 03/03/2015   LDLCALC 67 03/03/2015   TRIG 62 03/03/2015      Wt Readings from Last 3 Encounters:  06/29/16 144 lb 12 oz (65.7 kg)  06/17/16 148 lb (67.1 kg)  06/25/15 144 lb 12.8 oz (65.7 kg)       ASSESSMENT AND PLAN:  Bilateral carotid artery stenosis - Plan: EKG 12-Lead Stressed importance of aggressive cholesterol control She stopped Crestor on her own, denied any side effects or intolerance Recommended she restart Crestor 5 mg daily Repeat ultrasound on her next clinic visit  Hypertension goal BP (blood pressure) < 140/90 - Plan: EKG 12-Lead Long discussion concerning her blood pressure We did not add any medications but change the timing of her clonidine She was taking this at 3 AM, we recommended  she take it at 8 AM to avoid rebound hypertension. Additional doses at dinnertime and before bed as she normally does  Hyperlipidemia LDL goal <100 - Plan: EKG 12-Lead As mentioned above, restart Crestor  OSA on CPAP - Plan: EKG 12-Lead Managed by [facility]   Total encounter time more than 25 minutes  Greater than 50% was spent in counseling and coordination of care with the patient   Disposition:   F/U  6 months   Orders Placed This Encounter  Procedures  . EKG 12-Lead     Signed, Esmond Plants, M.D., Ph.D. 06/29/2016  [place], [place]

## 2016-06-28 DIAGNOSIS — J019 Acute sinusitis, unspecified: Secondary | ICD-10-CM | POA: Diagnosis not present

## 2016-06-28 DIAGNOSIS — J209 Acute bronchitis, unspecified: Secondary | ICD-10-CM | POA: Diagnosis not present

## 2016-06-29 ENCOUNTER — Encounter: Payer: Self-pay | Admitting: Cardiovascular Disease

## 2016-06-29 ENCOUNTER — Ambulatory Visit (INDEPENDENT_AMBULATORY_CARE_PROVIDER_SITE_OTHER): Payer: PPO | Admitting: Cardiovascular Disease

## 2016-06-29 ENCOUNTER — Telehealth: Payer: Self-pay

## 2016-06-29 VITALS — BP 120/58 | HR 61 | Ht 62.5 in | Wt 144.8 lb

## 2016-06-29 DIAGNOSIS — Z9989 Dependence on other enabling machines and devices: Secondary | ICD-10-CM | POA: Diagnosis not present

## 2016-06-29 DIAGNOSIS — E119 Type 2 diabetes mellitus without complications: Secondary | ICD-10-CM | POA: Diagnosis not present

## 2016-06-29 DIAGNOSIS — I10 Essential (primary) hypertension: Secondary | ICD-10-CM

## 2016-06-29 DIAGNOSIS — E785 Hyperlipidemia, unspecified: Secondary | ICD-10-CM

## 2016-06-29 DIAGNOSIS — G4733 Obstructive sleep apnea (adult) (pediatric): Secondary | ICD-10-CM | POA: Diagnosis not present

## 2016-06-29 DIAGNOSIS — I6523 Occlusion and stenosis of bilateral carotid arteries: Secondary | ICD-10-CM

## 2016-06-29 MED ORDER — ROSUVASTATIN CALCIUM 5 MG PO TABS: 5.0000 mg | ORAL_TABLET | Freq: Every day | ORAL | 11 refills | Status: DC

## 2016-06-29 MED ORDER — AMLODIPINE BESYLATE 2.5 MG PO TABS: 2.5000 mg | ORAL_TABLET | Freq: Every day | ORAL | 3 refills | Status: DC

## 2016-06-29 NOTE — Telephone Encounter (Signed)
I called pt to discuss her HLA results (normal/negative). No answer, left a message asking her to call me back.

## 2016-06-29 NOTE — Patient Instructions (Addendum)
Medication Instructions:   Please take clonidine at 8 Am, 5 pm and 11 pm Take amlodipine 2.5 mg at 5 pm Take losartan at 8 Am  Monitor your blood pressure  Labwork:  No new labs needed  Testing/Procedures:  No further testing at this time   I recommend watching educational videos on topics of interest to you at:       [email or web address]  Enter code: [email or web address]    Follow-Up: It was a pleasure seeing you in the office today. Please call us if you have new issues that need to be addressed before your next appt.  [phone]  Your physician wants you to follow-up in: 6 months.  You will receive a reminder letter in the mail two months in advance. If you don't receive a letter, please call our office to schedule the follow-up appointment.  If you need a refill on your cardiac medications before your next appointment, please call your pharmacy.

## 2016-07-06 DIAGNOSIS — E119 Type 2 diabetes mellitus without complications: Secondary | ICD-10-CM | POA: Diagnosis not present

## 2016-07-07 NOTE — Telephone Encounter (Signed)
Pt returned my call. I advised her that her HLA test was normal/negative and that she should still proceed with her sleep studies. Pt is having a mold problem in her house and will complete the sleep studies following resolution of the mold problem. Pt verbalized understanding of results. Pt had no questions at this time but was encouraged to call back if questions arise.

## 2016-07-08 DIAGNOSIS — J209 Acute bronchitis, unspecified: Secondary | ICD-10-CM | POA: Diagnosis not present

## 2016-07-16 ENCOUNTER — Telehealth: Payer: Self-pay | Admitting: Cardiovascular Disease

## 2016-07-16 NOTE — Telephone Encounter (Signed)
Lmov for patient to call back  Need to schedule yearly Carotid

## 2016-07-27 ENCOUNTER — Other Ambulatory Visit: Payer: Self-pay | Admitting: Cardiovascular Disease

## 2016-07-27 DIAGNOSIS — I6523 Occlusion and stenosis of bilateral carotid arteries: Secondary | ICD-10-CM

## 2016-08-03 ENCOUNTER — Other Ambulatory Visit: Payer: Self-pay | Admitting: Cardiovascular Disease

## 2016-08-05 ENCOUNTER — Ambulatory Visit: Payer: PPO

## 2016-08-05 DIAGNOSIS — I6523 Occlusion and stenosis of bilateral carotid arteries: Secondary | ICD-10-CM | POA: Diagnosis not present

## 2016-08-09 ENCOUNTER — Other Ambulatory Visit: Payer: Self-pay

## 2016-08-09 ENCOUNTER — Telehealth: Payer: Self-pay | Admitting: Cardiovascular Disease

## 2016-08-09 DIAGNOSIS — I10 Essential (primary) hypertension: Secondary | ICD-10-CM | POA: Diagnosis not present

## 2016-08-09 DIAGNOSIS — R600 Localized edema: Secondary | ICD-10-CM | POA: Diagnosis not present

## 2016-08-09 DIAGNOSIS — I872 Venous insufficiency (chronic) (peripheral): Secondary | ICD-10-CM | POA: Diagnosis not present

## 2016-08-09 NOTE — Telephone Encounter (Signed)
S/w pt to call us carotid results.  Reports "severe" bilateral lower extremity swelling and redness for 2 and 1/2 weeks which has recently worsened. States amlodipine 2.5mg  was prescribed June 5. She originally thought LE redness was r/t a different shower gel she was using. Stopped it but swelling and redness has worsened and she is scared. Unable to sleep last night due to worry. She is diabetic and is concerned she may lose her legs. States "I did my homework last night and know I have cellulitis and I know it is the amlodipine" Last dose of amlodipine 08/07/16 Takes clonidine 0.1mg  TID and losartan 100mg  qd.  Pt understands to continue to hold amlodipine until further recommendations from Dr. Rockey Situ. She will contact Dr. Gabriel Carina, endocrinologist, as she would like to be seen today for an evaluation. Reviewed with Dr. Rockey Situ. Pt has been taking amlodipine 2.5mg . Timing of medication was changed to PM at 6/5 OV. Dr. Rockey Situ is agreeable to d/c amlodipine and continue losartan qd and clonidine TID. Make sure she is wearing compression stockings.  Continue to monitor BP   I called pt back to report plan per Dr. Rockey Situ.  Pt admits she never took amlodipine as prescribed but she did not tell him this at her OV.  She only took it "a time or two" but did start taking as prescribed on June 5. BP before bed 170s/80s when taking clonidine BID and losartan qd. She has been taking losartan and clonidine in the AM, clonidine at bedtime and at 3am with improvement in pressures. She will wear compression stockings, monitor BP and report if it continues to be elevated. States she called Dr. Joycie Peek office for an appt but they  took a message for the nurse and gave her an appt in two weeks. Advised pt to wait for the nurse to call back for further advice regarding appt as she thinks she has cellulitis and needs abx. She does not want to go to an Urgent Care.  Pt understands all instructions and repeated back to  me. Routed to Dr. Rockey Situ to make aware. Medication list updated.

## 2016-08-10 NOTE — Telephone Encounter (Signed)
Agree with holding amlodipine and using compression hose, leg elevation Symptoms should improve over the next week or so Patient is aware

## 2016-08-11 DIAGNOSIS — G4733 Obstructive sleep apnea (adult) (pediatric): Secondary | ICD-10-CM | POA: Diagnosis not present

## 2016-08-11 NOTE — Telephone Encounter (Signed)
Pt saw PCP for this yesterday.

## 2016-08-11 NOTE — Telephone Encounter (Signed)
Left message for her to call back

## 2016-08-12 NOTE — Telephone Encounter (Signed)
Left voicemail message for patient to call back.

## 2016-08-12 NOTE — Telephone Encounter (Signed)
Pt returned Yvonne Ser, RN call from yesterday. States he PCP has treated her for her reaction. Pt states she is stll "swollen all up, arms and legs".  Pt states she has not taken the medication her PCP prescribed for her. Please call.

## 2016-08-12 NOTE — Telephone Encounter (Signed)
Spoke with patient and she stated that she went to her PCP and they told her it was not cellulitis. She states that they are still swelling, itching, and burning. She reports that she is wearing the compression hose at night but has not been able to do them in the day. Instructed her that it would be better for her to try them during the day and off at night because of having her feet down. She stated that she would give that a try and see if it would help. Instructed her to try that for a couple of weeks and give [place] a call back if they persist or worsen. She verbalized understanding of our conversation, agreement with plan, and had no further questions at this time.

## 2016-08-18 ENCOUNTER — Ambulatory Visit (INDEPENDENT_AMBULATORY_CARE_PROVIDER_SITE_OTHER): Payer: PPO | Admitting: Neurology

## 2016-08-18 DIAGNOSIS — G4733 Obstructive sleep apnea (adult) (pediatric): Secondary | ICD-10-CM

## 2016-08-18 DIAGNOSIS — G4719 Other hypersomnia: Secondary | ICD-10-CM

## 2016-08-18 DIAGNOSIS — Q382 Macroglossia: Secondary | ICD-10-CM

## 2016-08-18 DIAGNOSIS — Z9989 Dependence on other enabling machines and devices: Secondary | ICD-10-CM

## 2016-08-18 DIAGNOSIS — G47411 Narcolepsy with cataplexy: Secondary | ICD-10-CM

## 2016-08-18 DIAGNOSIS — R6889 Other general symptoms and signs: Secondary | ICD-10-CM

## 2016-08-19 ENCOUNTER — Encounter (INDEPENDENT_AMBULATORY_CARE_PROVIDER_SITE_OTHER): Payer: PPO | Admitting: Neurology

## 2016-08-19 DIAGNOSIS — Z9989 Dependence on other enabling machines and devices: Secondary | ICD-10-CM

## 2016-08-19 DIAGNOSIS — G47411 Narcolepsy with cataplexy: Secondary | ICD-10-CM

## 2016-08-19 DIAGNOSIS — G4733 Obstructive sleep apnea (adult) (pediatric): Secondary | ICD-10-CM

## 2016-08-19 DIAGNOSIS — R6889 Other general symptoms and signs: Secondary | ICD-10-CM

## 2016-08-19 DIAGNOSIS — G4719 Other hypersomnia: Secondary | ICD-10-CM

## 2016-08-24 ENCOUNTER — Institutional Professional Consult (permissible substitution): Payer: PPO | Admitting: Internal Medicine

## 2016-08-24 NOTE — Procedures (Signed)
PATIENT'S NAME:  Yvonne Davidson, Yvonne Davidson DOB:      [birth year]      MR#:    [id number]     DATE OF RECORDING: 08/18/2016 REFERRING M.D.:  Bobetta Lime, MD Study Performed:   PSG with CPAP Titration- for MSLT to follow  HISTORY:  This female patient of Dr. Nadine Counts has a very complicated medical and sleep history.  She struggles with excessive daytime sleepiness endorsing the Epworth sleepiness score at 20 points. She had undergone an evaluation with Dr. 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   She was evaluated for sleep apnea in 2000 and had been compliant with CPAP use. She was followed by Dr. Annamaria Boots, who performed the PSG , told her that her obstructive sleep apnea was in moderate range. She started on CPAP but she had aerophagia. This continued to be a problem in spite of trying CPAP repeatedly and with different interfaces.  She has tried oral appliance - actually two. None of them treated the snoring sufficiently and none of them gave her energy or refreshing restorative sleep.  She no longer sleeps in the same room as her husband due to her snoring. Whenever she is physically inactive and mentally not stimulated, she will go to sleep.  She can recall being asleep within seconds even when she was young, very slender and physically very active. Her sleep at the time was of good quality and she would usually wake up refreshed but she craves naps even then.  She would have dream hallucinations and dream intrusions hypnagogic or hypnopompic, sleep paralysis. She states that she tries to laugh and have fun but she has gotten a couple of times a week in response this sounds like a form of cataplexy. Her dreams are vivid and may continue and several installments after she wakes up. The scariest dreams all those when she feels the presence of another being in the room but cannot move to get away. She also struggles with depression, hypertension and diabetes.   Epworth 20/24 points  The  patient's weight 148 pounds with a height of 62 (inches), resulting in a BMI of 27.2 kg/m2. The patient's neck circumference measured 15 inches.  CURRENT MEDICATIONS: One touch ultra; Os-Cal; Vitamin D3; Clonidine; Catapres; Cozaar; Glucophage   PROCEDURE:  This is a multichannel digital polysomnogram utilizing the SomnoStar 11.2 system.  Electrodes and sensors were applied and monitored per AASM Specifications.   EEG, EOG, Chin and Limb EMG, were sampled at 200 Hz.  ECG, Snore and Nasal Pressure, Thermal Airflow, Respiratory Effort, CPAP Flow and Pressure, Oximetry was sampled at 50 Hz. Digital video and audio were recorded.      CPAP was initiated at 8 cmH20 with heated humidity and reached an AHI of 0.7/hr. The pressure was advanced to 9cmH20 because of hypopneas, apneas and desaturations.  At a PAP pressure of 9 cmH20, there was a reduction of the AHI to 0.0.   Lights Out was at 20:47 and Lights On at 05:48. Total recording time (TRT) was 541 minutes, with a total sleep time (TST) of 418 minutes. The patient's sleep latency was 3 minutes with 0 minutes of wake time after sleep onset. REM latency was 290.5 minutes.  The sleep efficiency was 77.3 %.    SLEEP ARCHITECTURE: WASO (Wake after sleep onset) was 120 minutes.  There were 16.5 minutes in Stage N1, 297.5 minutes Stage N2, 60.5 minutes Stage N3 and 43.5 minutes in Stage REM.  The percentage of Stage  N1 was 3.9%, Stage N2 was 71.2%, Stage N3 was 14.5% and Stage R (REM sleep) was 10.4%.   The arousals were noted as: 82 were spontaneous, 0 were associated with PLMs, and 2 were associated with respiratory events. EKG with sinus bradycardia Audio and video analysis did not show any abnormal or unusual movements, behaviors, phonations or vocalizations.   No Snoring was noted. The patient slept with a wedged pillow. The patient took 2 bathroom breaks.   RESPIRATORY ANALYSIS:  There was a total of 2 respiratory events: 2 obstructive apneas, 0  central apneas and 0 mixed apneas with a total of 2 apneas and an apnea index (AI) of 0.3 /hour. There were 0 hypopneas with a hypopnea index of 0/hour. The patient also had 0 respiratory event related arousals (RERAs).     The total APNEA/HYPOPNEA INDEX (AHI) was 0.3 /hour and the total RESPIRATORY DISTURBANCE INDEX was 0.3/hr. The patient spent 130.5 minutes of total sleep time in the supine position and 288 minutes in non-supine. The supine AHI was 0.0, versus a non-supine AHI of 0.4.  OXYGEN SATURATION & C02:  The baseline 02 saturation was 96%, with the lowest being 86%. Time spent below 89% saturation equaled 0 minutes.  DIAGNOSIS: OSA was well controlled on CPAP 8 and 9 cm water pressure, interface was not entered.  DISCUSSION: Valid PSG on CPAP for MSLT to follow.    MSLT REPORT     08-19-2016   Nap 1- Lights out- 7:17     Nap 2 Lights out- 9:08     Nap 3 Lights out- 11:15       Nap 4- Lights out- 13:16                  Lights on: 7:37                        Lights on- 9:31                      Lights on- 11:44                        Lights on: 13:48                  Sleep Latency- 20 mins          Sleep Latency- 50mins        Sleep Latency- 14 mins            Sleep Latency- 75mins                  Rem onset- 0                           Rem onset- 0                     Rem onset- 0                              Rem onset- 0 Nap 5- Lights out- 15:03                  Lights on- 15:23  Sleep Latency: 64mins                 Rem onset- 0 Mean Sleep Latency: 15.8 Total Sleep time from night before: 7.0 hrs  IMPRESSION: This is a normal MSLT following a valid PSG with a total sleep time of 418 minutes. There no REM sleep onset, and the mean sleep latency was over 10 minutes. 2 naps did not record any sleep.  Narcolepsy could not be diagnosed.          A follow up appointment will be scheduled in the Sleep Clinic at  [facility].   Please call [phone] with any questions.      I certify that I have reviewed the entire raw data recording prior to the issuance of this report in accordance with the Standards of Accreditation of the American Academy of Sleep Medicine (AASM)    Larey Seat, M.D.   08-24-2016  Diplomat, American Board of Psychiatry and Neurology  Diplomat, [place] [employer] [facility] Medical Director, [place] [facility]   Cc Dr. Maxwell Caul. [facility]

## 2016-08-25 ENCOUNTER — Telehealth: Payer: Self-pay | Admitting: Neurology

## 2016-08-25 NOTE — Telephone Encounter (Signed)
-----  Message from Larey Seat, MD sent at 08/24/2016  5:30 PM EDT ----- Cc Dr Nadine Counts, Dr.Osborne   The patient was sufficiently treated with CPAP at 8 and 9 cm water pressure, but continued to experience sleep fragmentation.  The MSLT study did not only show a norma mean sleep latency, but the patient did not sleep at all in 2 out of 5 naps, no REM sleep onset recorded ,either. I can not confirm Dr. Nancee Liter suspicions of posible narcolepsy.  HLA test will be forwarded to Drs. Nadine Counts and [employer].

## 2016-08-25 NOTE — Telephone Encounter (Signed)
Attempted to call pt with sleep study results. No answer. LVM for pt to return call

## 2016-08-25 NOTE — Telephone Encounter (Signed)
Called and spoke with pt in regards to her study. Pt verbalized understanding but is unsure as to why she is still having such difficulty sleeping. Pt agrees to a follow up visit to discuss this and figure a plan moving forward.

## 2016-09-17 NOTE — Telephone Encounter (Signed)
Error

## 2016-09-29 DIAGNOSIS — Z0000 Encounter for general adult medical examination without abnormal findings: Secondary | ICD-10-CM | POA: Insufficient documentation

## 2016-09-29 DIAGNOSIS — E7800 Pure hypercholesterolemia, unspecified: Secondary | ICD-10-CM | POA: Diagnosis not present

## 2016-09-29 DIAGNOSIS — M79671 Pain in right foot: Secondary | ICD-10-CM | POA: Diagnosis not present

## 2016-09-29 DIAGNOSIS — M79672 Pain in left foot: Secondary | ICD-10-CM | POA: Diagnosis not present

## 2016-09-30 DIAGNOSIS — E7800 Pure hypercholesterolemia, unspecified: Secondary | ICD-10-CM | POA: Diagnosis not present

## 2016-09-30 DIAGNOSIS — Z0000 Encounter for general adult medical examination without abnormal findings: Secondary | ICD-10-CM | POA: Diagnosis not present

## 2016-10-21 ENCOUNTER — Encounter: Payer: Self-pay | Admitting: Emergency Medicine

## 2016-10-21 ENCOUNTER — Emergency Department
Admission: EM | Admit: 2016-10-21 | Discharge: 2016-10-21 | Disposition: A | Discharge status: Home / Self Care | Payer: PPO | Attending: Emergency Medicine | Admitting: Emergency Medicine

## 2016-10-21 ENCOUNTER — Emergency Department: Payer: PPO

## 2016-10-21 DIAGNOSIS — M79671 Pain in right foot: Secondary | ICD-10-CM | POA: Diagnosis not present

## 2016-10-21 DIAGNOSIS — J45909 Unspecified asthma, uncomplicated: Secondary | ICD-10-CM | POA: Diagnosis not present

## 2016-10-21 DIAGNOSIS — M79672 Pain in left foot: Secondary | ICD-10-CM | POA: Diagnosis not present

## 2016-10-21 DIAGNOSIS — I10 Essential (primary) hypertension: Secondary | ICD-10-CM | POA: Insufficient documentation

## 2016-10-21 DIAGNOSIS — M7671 Peroneal tendinitis, right leg: Secondary | ICD-10-CM | POA: Diagnosis not present

## 2016-10-21 DIAGNOSIS — Z79899 Other long term (current) drug therapy: Secondary | ICD-10-CM | POA: Insufficient documentation

## 2016-10-21 DIAGNOSIS — E119 Type 2 diabetes mellitus without complications: Secondary | ICD-10-CM | POA: Diagnosis not present

## 2016-10-21 DIAGNOSIS — Z91040 Latex allergy status: Secondary | ICD-10-CM | POA: Insufficient documentation

## 2016-10-21 DIAGNOSIS — M898X9 Other specified disorders of bone, unspecified site: Secondary | ICD-10-CM | POA: Diagnosis not present

## 2016-10-21 DIAGNOSIS — Z7984 Long term (current) use of oral hypoglycemic drugs: Secondary | ICD-10-CM | POA: Insufficient documentation

## 2016-10-21 DIAGNOSIS — I739 Peripheral vascular disease, unspecified: Secondary | ICD-10-CM | POA: Insufficient documentation

## 2016-10-21 LAB — URINALYSIS, ROUTINE W REFLEX MICROSCOPIC
Bilirubin Urine: NEGATIVE
GLUCOSE, UA: NEGATIVE mg/dL
HGB URINE DIPSTICK: NEGATIVE
KETONES UR: NEGATIVE mg/dL
LEUKOCYTES UA: NEGATIVE
Nitrite: NEGATIVE
PROTEIN: NEGATIVE mg/dL
Specific Gravity, Urine: 1.005 (ref 1.005–1.030)
pH: 7 (ref 5.0–8.0)

## 2016-10-21 LAB — CBC
HCT: 37.1 % (ref 35.0–47.0)
HEMOGLOBIN: 12.8 g/dL (ref 12.0–16.0)
MCH: 31.1 pg (ref 26.0–34.0)
MCHC: 34.4 g/dL (ref 32.0–36.0)
MCV: 90.5 fL (ref 80.0–100.0)
PLATELETS: 240 10*3/uL (ref 150–440)
RBC: 4.09 MIL/uL (ref 3.80–5.20)
RDW: 14.4 % (ref 11.5–14.5)
WBC: 7.4 10*3/uL (ref 3.6–11.0)

## 2016-10-21 LAB — TROPONIN I

## 2016-10-21 LAB — BASIC METABOLIC PANEL
ANION GAP: 8 (ref 5–15)
BUN: 18 mg/dL (ref 6–20)
CALCIUM: 9.1 mg/dL (ref 8.9–10.3)
CO2: 27 mmol/L (ref 22–32)
CREATININE: 0.74 mg/dL (ref 0.44–1.00)
Chloride: 104 mmol/L (ref 101–111)
Glucose, Bld: 135 mg/dL — ABNORMAL HIGH (ref 65–99)
Potassium: 3.9 mmol/L (ref 3.5–5.1)
Sodium: 139 mmol/L (ref 135–145)

## 2016-10-21 NOTE — ED Triage Notes (Signed)
Pt c/o hypertension since waking this AM. Pt reports she normally gets up every night and takes BP meds and when she checked her BP this AM before taking medication, pts BP 178/60. Pt took 0.1 Clonidine before coming to ED.
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
## 2016-10-21 NOTE — Discharge Instructions (Signed)
1. Please restart your Losartan and take your clonidine only as directed by your doctor. 2. Keep a blood pressure log and take this to your doctor when you follow-up. 3. Return to the ER for worsening symptoms, persistent vomiting, difficulty breathing or other concerns.

## 2016-10-21 NOTE — ED Notes (Signed)
Dr sung in to assess pt

## 2016-10-21 NOTE — Progress Notes (Signed)
Cardiology Office Note  Date:  10/22/2016   ID:  Yvonne Davidson, DOB 12/04/1940, MRN [id number]  PCP:  Linthavong, [place], MD   Chief Complaint  Patient presents with  . other    F/u ED due to elevated BP. Pt mentioned she was taking clonidine 0.1 mg up to four times daily. She has cut back off clonidine. Meds reviewed verbally with pt.    HPI:  Yvonne Davidson is a 76-year-old woman with a history of  60-79% carotid disease on the left, 40-59% disease on the right,  Hyperlipidemia DM hypertension,  obstructive sleep apnea who is followed at [facility],  chronic pain in her legs,  GERD,  She presents for follow-up of her blood pressure and carotid disease Seen in the emergency room yesterday for hypertension  Blood pressure elevated On a regular basis, often 150-year-old higher  Taking extra clonidine, 4 1/2 pills a day Stopped her losartan 3 weeks ago, off amlodipine secondary to leg swelling   Went to the ER  yesterday for SBP 190  Has not eaten more than one meal today, not eating well in general  Not sleeping well, nervous Significant stress at home, poor relationship with her husband who she married 3 years ago  Allergy to numerous medications including lisinopril (reaction unclear), HCTZ (pancreatitis), amlodipine (swelling)  On her last clinic visit she reported having high blood pressure 190 systolic the evenings  She stopped Crestor on her last clinic visit   She is troubled by  mold in her attic Last clinic visit with sinus congestion, coughing, bronchitis per the patient  Denies  significant shortness of breath or chest pain No regular exercise program hemoglobin A1c 6.6, managed by Dr. Solum  sleep apnea, wears nasal pillow with a chin strap, mouthguard  Other past medical history For obstructive sleep apnea, she wears a mouth piece designed by [facility] .  she has chronic aching in her legs. She attributes this to sleep apnea and poor  blood supply to her legs  She  retired at the age of 72. She was working in a cafeteria. She was unable to maintain the pace with such poor sleep on a chronic basis  PMH:   has a past medical history of Allergy; Anemia; Anxiety; Arthritis; Bilateral cataracts; Chronic sinusitis; Depression; Diabetes mellitus without complication (HCC); Diverticulosis; Dry eyes; GERD (gastroesophageal reflux disease); Hashimoto's thyroiditis; History of blood transfusion; Hyperlipidemia; Hypertension; Insomnia; OSA (obstructive sleep apnea); Osteoporosis; PONV (postoperative nausea and vomiting); and Urinary frequency.  PSH:    Past Surgical History:  Procedure Laterality Date  . CHOLECYSTECTOMY  12/07/2012   Dr Newman  . CHOLECYSTECTOMY N/A 12/07/2012   Procedure: LAPAROSCOPIC CHOLECYSTECTOMY WITH INTRAOPERATIVE CHOLANGIOGRAM;  Surgeon: David H Newman, MD;  Location: [facility];  Service: General;  Laterality: N/A;  . LIPOSUCTION    . NASAL SINUS SURGERY  2006   x 2  . right arm surgery  1960  . TCS      Current Outpatient Prescriptions  Medication Sig Dispense Refill  . Artificial Tear Ointment (REFRESH LACRI-LUBE) OINT Apply 1 application to eye at bedtime.    . B-D ULTRA-FINE 33 LANCETS MISC Use as directed. Dx 250.0    . Blood Glucose Monitoring Suppl (FIFTY50 GLUCOSE METER 2.0) W/DEVICE KIT USE AS DIRECTED    . Blood Glucose Monitoring Suppl (ONE TOUCH ULTRA MINI) W/DEVICE KIT USE AS DIRECTED 1 each 0  . calcium carbonate (OS-CAL - DOSED IN MG OF ELEMENTAL CALCIUM) 1250 (500   CA) MG tablet Take by mouth.    . carboxymethylcellulose (REFRESH PLUS) 0.5 % SOLN Place 1 drop into both eyes 3 (three) times daily as needed (for dry eyes).     . Cholecalciferol (VITAMIN D3) 1000 UNITS CAPS Take by mouth.    . Cyanocobalamin 1000 MCG TBCR Take by mouth.    . glucose blood (CHOICE DM FORA G20 TEST STRIPS) test strip Use as instructed 100 each 12  . losartan (COZAAR) 100 MG tablet TAKE 1 TABLET (100 MG TOTAL) BY  MOUTH EVERY MORNING. 90 tablet 3  . metFORMIN (GLUCOPHAGE) 500 MG tablet Take 1 tablet (500 mg total) by mouth 2 (two) times daily with a meal. (Patient taking differently: Take 500 mg by mouth daily. ) 60 tablet 3  . ONETOUCH DELICA LANCETS 33G MISC Use as directed. Dx 250.0 100 each 3  . rosuvastatin (CRESTOR) 5 MG tablet Take 1 tablet (5 mg total) by mouth daily. 30 tablet 11  . cloNIDine (CATAPRES) 0.1 MG tablet TAKE 1 TABLET BY MOUTH 3 TIMES DAILY. (Patient not taking: Reported on 10/22/2016) 270 tablet 3   No current facility-administered medications for this visit.      Allergies:   Buprenorphine hcl; Codeine; Mold extract [trichophyton mentagrophyte]; Morphine and related; Oxycodone; Oxycodone hcl; Oxycodone hcl; Sumycin [tetracycline hcl]; Chlorphen-phenyleph-asa; Decongest-aid [pseudoephedrine]; Esomeprazole magnesium; Esomeprazole magnesium; Hydrochlorothiazide; Indomethacin; Procaine hcl; Keflex [cephalexin]; Latex; Neomycin-bacitracin zn-polymyx; Pseudoephedrine hcl er; Sudafed pe cold & cough child  [phenylephrine-dm]; Telithromycin; and Tetracycline   Social History:  The patient  reports that she has never smoked. She has never used smokeless tobacco. She reports that she does not drink alcohol or use drugs.   Family History:   family history includes Alcohol abuse in her father; Arthritis in her mother; Cancer in her mother; Early death in her father; Heart disease in her mother; Hyperlipidemia in her mother; Hypertension in her mother; Stroke in her mother.    Review of Systems: Review of Systems  Constitutional: Negative.   Respiratory: Negative.   Cardiovascular: Negative.   Gastrointestinal: Negative.   Musculoskeletal: Negative.   Psychiatric/Behavioral: Positive for depression. The patient is nervous/anxious and has insomnia.   All other systems reviewed and are negative.    PHYSICAL EXAM: VS:  BP (!) 144/70 (BP Location: Left Arm, Patient Position: Sitting, Cuff  Size: Normal)   Pulse 61   Ht 5' 2" (1.575 m)   Wt 145 lb (65.8 kg)   BMI 26.52 kg/m  , BMI Body mass index is 26.52 kg/m.  GEN: Well nourished, well developed, in no acute distress  HEENT: normal , Itchy red eyes, coughing  Neck: no JVD, carotid bruits, or masses Cardiac: RRR; no murmurs, rubs, or gallops,no edema  Respiratory:  clear to auscultation bilaterally, normal work of breathing GI: soft, nontender, nondistended, + BS MS: no deformity or atrophy  Skin: warm and dry, no rash Neuro:  Strength and sensation are intact Psych: euthymic mood, full affect    Recent Labs: 10/21/2016: BUN 18; Creatinine, Ser 0.74; Hemoglobin 12.8; Platelets 240; Potassium 3.9; Sodium 139    Lipid Panel Lab Results  Component Value Date   CHOL 126 03/03/2015   HDL 47 03/03/2015   LDLCALC 67 03/03/2015   TRIG 62 03/03/2015      Wt Readings from Last 3 Encounters:  10/22/16 145 lb (65.8 kg)  10/21/16 144 lb (65.3 kg)  06/29/16 144 lb 12 oz (65.7 kg)       ASSESSMENT AND PLAN:     Bilateral carotid artery stenosis - Plan: EKG 12-Lead Encouraged her to stay on at least Crestor 5 daily, previously she held the medication on her own We will recommend repeat ultrasound on follow-up visit  Hypertension goal BP (blood pressure) < 140/90 - Plan: EKG 12-Lead Hypertension, poorly controlled recently Exacerbated by anxiety/stress at home Recommended she talk with primary care about starting SSRI. Suggested she start Cardura 2 mg daily titrating up to 4 mg as needed for hypertension Recommended she take hydralazine 25 up to 50 mg as needed for pressure of 170 With this regimen above, we can avoid the emergency room   Hyperlipidemia LDL goal <100 - Plan: EKG 12-Lead Stay on Crestor 5 daily  OSA on CPAP - Plan: EKG 12-Lead Managed by [facility]   Total encounter time more than 45 minutes  Greater than 50% was spent in counseling and coordination of care with the  patient   Disposition:   F/U  6 months   No orders of the defined types were placed in this encounter.    Signed, Tim , M.D., Ph.D. 10/22/2016  [facility], [place] [phone]  

## 2016-10-21 NOTE — ED Notes (Signed)
Up to toilet in room with steady gait to collect urine specimen;

## 2016-10-22 ENCOUNTER — Ambulatory Visit (INDEPENDENT_AMBULATORY_CARE_PROVIDER_SITE_OTHER): Payer: PPO | Admitting: Cardiovascular Disease

## 2016-10-22 ENCOUNTER — Encounter: Payer: Self-pay | Admitting: Cardiovascular Disease

## 2016-10-22 VITALS — BP 144/70 | HR 61 | Ht 62.0 in | Wt 145.0 lb

## 2016-10-22 DIAGNOSIS — I272 Pulmonary hypertension, unspecified: Secondary | ICD-10-CM

## 2016-10-22 DIAGNOSIS — I10 Essential (primary) hypertension: Secondary | ICD-10-CM | POA: Diagnosis not present

## 2016-10-22 DIAGNOSIS — E785 Hyperlipidemia, unspecified: Secondary | ICD-10-CM | POA: Diagnosis not present

## 2016-10-22 DIAGNOSIS — I739 Peripheral vascular disease, unspecified: Secondary | ICD-10-CM

## 2016-10-22 DIAGNOSIS — I6523 Occlusion and stenosis of bilateral carotid arteries: Secondary | ICD-10-CM

## 2016-10-22 DIAGNOSIS — E119 Type 2 diabetes mellitus without complications: Secondary | ICD-10-CM

## 2016-10-22 MED ORDER — HYDRALAZINE HCL 50 MG PO TABS: 50.0000 mg | ORAL_TABLET | ORAL | 3 refills | Status: DC

## 2016-10-22 MED ORDER — DOXAZOSIN MESYLATE 4 MG PO TABS
4.0000 mg | ORAL_TABLET | ORAL | 3 refills | Status: DC
Start: 2016-10-22 — End: 2016-10-25

## 2016-10-22 NOTE — Patient Instructions (Addendum)
Medication Instructions:   Please start cardura/doxazosin 4 mg pill  Take 1/2 pill once a day at dinner If pressure runs high, take a full pill  For emergency, high pressure Take hydralazine 50 mg pill 1/2 up to 1 pill as needed up to 3 times a day Pill last 6 hours  Labwork:  No new labs needed  Testing/Procedures:  No further testing at this time   Follow-Up: It was a pleasure seeing you in the office today. Please call us if you have new issues that need to be addressed before your next appt.  [phone]  Your physician wants you to follow-up in:  As needed  If you need a refill on your cardiac medications before your next appointment, please call your pharmacy.

## 2016-10-25 ENCOUNTER — Telehealth: Payer: Self-pay | Admitting: Cardiovascular Disease

## 2016-10-25 ENCOUNTER — Encounter: Payer: Self-pay | Admitting: Emergency Medicine

## 2016-10-25 ENCOUNTER — Emergency Department
Admission: EM | Admit: 2016-10-25 | Discharge: 2016-10-25 | Disposition: A | Discharge status: Home / Self Care | Payer: PPO | Attending: Emergency Medicine | Admitting: Emergency Medicine

## 2016-10-25 DIAGNOSIS — R946 Abnormal results of thyroid function studies: Secondary | ICD-10-CM | POA: Insufficient documentation

## 2016-10-25 DIAGNOSIS — E119 Type 2 diabetes mellitus without complications: Secondary | ICD-10-CM | POA: Insufficient documentation

## 2016-10-25 DIAGNOSIS — I493 Ventricular premature depolarization: Secondary | ICD-10-CM | POA: Diagnosis not present

## 2016-10-25 DIAGNOSIS — E063 Autoimmune thyroiditis: Secondary | ICD-10-CM | POA: Insufficient documentation

## 2016-10-25 DIAGNOSIS — F329 Major depressive disorder, single episode, unspecified: Secondary | ICD-10-CM | POA: Insufficient documentation

## 2016-10-25 DIAGNOSIS — F419 Anxiety disorder, unspecified: Secondary | ICD-10-CM

## 2016-10-25 DIAGNOSIS — I10 Essential (primary) hypertension: Secondary | ICD-10-CM | POA: Insufficient documentation

## 2016-10-25 DIAGNOSIS — J45909 Unspecified asthma, uncomplicated: Secondary | ICD-10-CM | POA: Insufficient documentation

## 2016-10-25 DIAGNOSIS — R000 Tachycardia, unspecified: Secondary | ICD-10-CM | POA: Diagnosis not present

## 2016-10-25 DIAGNOSIS — R002 Palpitations: Secondary | ICD-10-CM

## 2016-10-25 DIAGNOSIS — Z91040 Latex allergy status: Secondary | ICD-10-CM | POA: Insufficient documentation

## 2016-10-25 DIAGNOSIS — Z9049 Acquired absence of other specified parts of digestive tract: Secondary | ICD-10-CM | POA: Diagnosis not present

## 2016-10-25 DIAGNOSIS — R7989 Other specified abnormal findings of blood chemistry: Secondary | ICD-10-CM

## 2016-10-25 LAB — CBC
HCT: 41.3 % (ref 35.0–47.0)
HEMOGLOBIN: 14.2 g/dL (ref 12.0–16.0)
MCH: 30.6 pg (ref 26.0–34.0)
MCHC: 34.3 g/dL (ref 32.0–36.0)
MCV: 89.1 fL (ref 80.0–100.0)
Platelets: 237 10*3/uL (ref 150–440)
RBC: 4.63 MIL/uL (ref 3.80–5.20)
RDW: 13.9 % (ref 11.5–14.5)
WBC: 7.9 10*3/uL (ref 3.6–11.0)

## 2016-10-25 LAB — BASIC METABOLIC PANEL
ANION GAP: 11 (ref 5–15)
BUN: 15 mg/dL (ref 6–20)
CHLORIDE: 105 mmol/L (ref 101–111)
CO2: 23 mmol/L (ref 22–32)
CREATININE: 0.77 mg/dL (ref 0.44–1.00)
Calcium: 9.6 mg/dL (ref 8.9–10.3)
GFR calc non Af Amer: >60 mL/min (ref >60)
Glucose, Bld: 172 mg/dL — ABNORMAL HIGH (ref 65–99)
POTASSIUM: 3.6 mmol/L (ref 3.5–5.1)
SODIUM: 139 mmol/L (ref 135–145)

## 2016-10-25 LAB — TSH: TSH: 6.756 u[IU]/mL — AB (ref 0.350–4.500)

## 2016-10-25 LAB — TROPONIN I

## 2016-10-25 MED ORDER — METOPROLOL TARTRATE 5 MG/5ML IV SOLN
5.0000 mg | Freq: Once | INTRAVENOUS | Status: AC
  Administered 2016-10-25: 5 mg via INTRAVENOUS
  Filled 2016-10-25: qty 5

## 2016-10-25 MED ORDER — METOPROLOL TARTRATE 25 MG PO TABS: 25.0000 mg | ORAL_TABLET | Freq: Two times a day (BID) | ORAL | 0 refills | Status: DC

## 2016-10-25 MED ORDER — SODIUM CHLORIDE 0.9 % IV BOLUS (SEPSIS)
1000.0000 mL | Freq: Once | INTRAVENOUS | Status: AC
  Administered 2016-10-25: 1000 mL via INTRAVENOUS

## 2016-10-25 NOTE — Telephone Encounter (Signed)
I also reviewed Dr. Donivan Scull note and AVS. He never advised the patient to stop clonidine. If her BP remains > 583 mmHg systolic she should resume clonidine 0.1 mg bid.   With the patient's numerous medication intolerances, treating her hypertension is difficult.   I also recommend the patient follow up with her PCP to evaluate her elevated TSH (likely subclinical) as well as consideration for SSRI.

## 2016-10-25 NOTE — Telephone Encounter (Signed)
Pt in the ED Thursday for HTN; office visit w/Dr. Rockey Situ Friday; in ED this morning. BP 134/58. Pt instructed to stop the doxazosin and the hydralazine; start metoprolol 25 mg twice daily. IV dose given while in the ED.  Left message on machine for patient to contact the office.

## 2016-10-25 NOTE — Telephone Encounter (Signed)
Pt called back but call lost. Left message on machine for patient to contact the office.

## 2016-10-25 NOTE — Telephone Encounter (Signed)
S/w patient. She is concerned about her BP being elevated and the reaction to Cardura which she started on 10/23/16. Says she took the first dose on Saturday which caused her to have heart palpitations, increased heartrate and break out in a cold sweat. She says she's very sensitive to medications, so she tried the hydralazine by taking it in small doses. Today in ED, her Cardura and hydralazine were discontinued and she was started on Metoprolol. Patient does not think this will be enough for her and is insistent that she needs further testing. Advised patient that I could not make that decision and the doctor would need to advise. BP now while at home is 156/50. Patient also stopped her clonidine last Friday but I do not see mention of that in Dr Donivan Scull note. Patient scheduled to see Thurmond Butts tomorrow, 10/26/16, for evaluation of symptoms and f/u from ED visit.

## 2016-10-25 NOTE — Discharge Instructions (Signed)
Please stop taking the doxazosin and the hydralazine.  Start metoprolol, you may take the first dose when you get home, then your second daily dose this evening.  Please make a follow-up appointment with your cardiologist.  Return to the emergency department for severe pain, chest pain, shortness of breath, palpitations, sweaty feeling, lightheadedness or fainting, or any other symptoms concerning to you.

## 2016-10-25 NOTE — Telephone Encounter (Signed)
Patient states that this is an emergency situation and the nurse is welcome to call her back if she is still living she would be glad to talk to her

## 2016-10-25 NOTE — ED Notes (Signed)
Pt lying on stretcher, side rails up. Lights dimmed. IVF still infusing. Pt given ginger ale. Husband at bedside.

## 2016-10-25 NOTE — Telephone Encounter (Signed)
Patient made aware and verbalized understanding of instructions and will be present at appointment tomorrow.

## 2016-10-25 NOTE — Telephone Encounter (Signed)
Patient calling to talk to the nurse asap   Her bp is now 177/65 and she has a bad headache
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
## 2016-10-25 NOTE — ED Triage Notes (Signed)
Pt reports recently taken off clonidine and put on doxazosin and hydralazine. Pt reports she thinks she went into cardiac arrest this weekend because she broke out in a cold sweat, had nausea, palpitations and felt like she was going to pass out. Pt reports feeling very shaky and having palpitations.

## 2016-10-25 NOTE — Telephone Encounter (Signed)
Pt was seen in ED on 10/25/16 for Palpations   She was just seen in office on Friday by Dr Rockey Situ  Please advise if patient needs to be seen in office.

## 2016-10-26 ENCOUNTER — Encounter: Payer: Self-pay | Admitting: Physician Assistant

## 2016-10-26 ENCOUNTER — Ambulatory Visit (INDEPENDENT_AMBULATORY_CARE_PROVIDER_SITE_OTHER): Payer: PPO | Admitting: Physician Assistant

## 2016-10-26 VITALS — BP 122/64 | HR 59 | Ht 65.0 in | Wt 145.0 lb

## 2016-10-26 DIAGNOSIS — R011 Cardiac murmur, unspecified: Secondary | ICD-10-CM | POA: Diagnosis not present

## 2016-10-26 DIAGNOSIS — I6523 Occlusion and stenosis of bilateral carotid arteries: Secondary | ICD-10-CM

## 2016-10-26 DIAGNOSIS — I739 Peripheral vascular disease, unspecified: Secondary | ICD-10-CM | POA: Diagnosis not present

## 2016-10-26 DIAGNOSIS — G4733 Obstructive sleep apnea (adult) (pediatric): Secondary | ICD-10-CM

## 2016-10-26 DIAGNOSIS — I272 Pulmonary hypertension, unspecified: Secondary | ICD-10-CM

## 2016-10-26 DIAGNOSIS — R7989 Other specified abnormal findings of blood chemistry: Secondary | ICD-10-CM | POA: Diagnosis not present

## 2016-10-26 DIAGNOSIS — Z789 Other specified health status: Secondary | ICD-10-CM | POA: Diagnosis not present

## 2016-10-26 DIAGNOSIS — I10 Essential (primary) hypertension: Secondary | ICD-10-CM

## 2016-10-26 DIAGNOSIS — Z9989 Dependence on other enabling machines and devices: Secondary | ICD-10-CM | POA: Diagnosis not present

## 2016-10-26 MED ORDER — METOPROLOL TARTRATE 25 MG PO TABS: 25.0000 mg | ORAL_TABLET | ORAL | 6 refills | Status: DC | PRN

## 2016-10-26 MED ORDER — LOSARTAN POTASSIUM 100 MG PO TABS: 100.0000 mg | ORAL_TABLET | Freq: Every day | ORAL | 6 refills | Status: DC

## 2016-10-26 NOTE — Patient Instructions (Addendum)
Medication Instructions:  Please restart losartan 100 mg once daily Please take metoprolol as needed for systolic blood pressure > 140  Labwork: Please proceed to the [facility] for  Cortisol Aldosterone-renin activity ratio Magnesium  Testing/Procedures: Your physician has requested that you have a renal artery duplex. During this test, an ultrasound is used to evaluate blood flow to the kidneys. Allow one hour for this exam. Do not eat after midnight the day before and avoid carbonated beverages. Take your medications as you usually do.  Your physician has requested that you have an echocardiogram. Echocardiography is a painless test that uses sound waves to create images of your heart. It provides your doctor with information about the size and shape of your heart and how well your heart's chambers and valves are working. This procedure takes approximately one hour. There are no restrictions for this procedure.  Follow-Up: 3 months with Dr. Rockey Situ  If you need a refill on your cardiac medications before your next appointment, please call your pharmacy.  Echocardiogram An echocardiogram, or echocardiography, uses sound waves (ultrasound) to produce an image of your heart. The echocardiogram is simple, painless, obtained within a short period of time, and offers valuable information to your health care provider. The images from an echocardiogram can provide information such as:  Evidence of coronary artery disease (CAD).  Heart size.  Heart muscle function.  Heart valve function.  Aneurysm detection.  Evidence of a past heart attack.  Fluid buildup around the heart.  Heart muscle thickening.  Assess heart valve function.  Tell a health care provider about:  Any allergies you have.  All medicines you are taking, including vitamins, herbs, eye drops, creams, and over-the-counter medicines.  Any problems you or family members have had with anesthetic medicines.  Any  blood disorders you have.  Any surgeries you have had.  Any medical conditions you have.  Whether you are pregnant or may be pregnant. What happens before the procedure? No special preparation is needed. Eat and drink normally. What happens during the procedure?  In order to produce an image of your heart, gel will be applied to your chest and a wand-like tool (transducer) will be moved over your chest. The gel will help transmit the sound waves from the transducer. The sound waves will harmlessly bounce off your heart to allow the heart images to be captured in real-time motion. These images will then be recorded.  You may need an IV to receive a medicine that improves the quality of the pictures. What happens after the procedure? You may return to your normal schedule including diet, activities, and medicines, unless your health care provider tells you otherwise. This information is not intended to replace advice given to you by your health care provider. Make sure you discuss any questions you have with your health care provider. Document Released: 01/09/2000 Document Revised: 08/30/2015 Document Reviewed: 09/18/2012 Elsevier Interactive Patient Education  2017 [employer].

## 2016-10-26 NOTE — Progress Notes (Signed)
Cardiology Office Note Date:  10/26/2016  Patient ID:  Yvonne Davidson, Yvonne Davidson July 20, 1940, MRN [id number] PCP:  Dion Body, MD  Cardiologist:  Dr. Rockey Situ, MD    Chief Complaint: ED follow up for HTN/palpitations  History of Present Illness: Yvonne Davidson is a 76 y.o. female with history of HTN, multiple medication intolerances (at least 38), severe anxiety, carotid artery disease, OSA followed at [facility], chronic leg pain, and GERD who presents for ED follow up of HTN/palpitations.   Most recent echo from 05/2011 showed EF 60%, trace AI, trace MR, mild TR.   Patient with numerous medication intolerances including lisinopril (reaction unclear), HCTZ (pancreatitis), amlodipine (leg swelling), Cardura (palpitations), and hydralazine (palpitations). She is under increased stress and anxiety at home. BP on a regular basis has been 150 or higher, systolic. She had been taking losartan for several years, along with clonidine. She self discontinued losartan in August 2018 (because it was not working). This led to increased BP, mainly at nighttime. She was setting an alarm clock for the middle of the night to take an extra clonidine upon stopping losartan. Patient was seen recently in the ED on 9/27 for elevated BP readings with SBP 190. EKG was not acute. Troponin negative. CXR not acute. BP self improved to 726 systolic and she was discharged to outpatient follow up. She saw Dr. Rockey Situ on 9/28 and was started on Cardura and hydralazine. She was continued on clonidine and losartan (though patient was not taking losartan). She started Cardura and hydralazine on 9/29, along with self discontinuing clonidine without a taper. She presented to the [facility] ED on 10/1 with palpitations, and stated she went into "cardiac arrest." There was denial of LOC at any time. Labs showed a mildly elevated TSH, unremarkable CBC, K+ 3.6, EKG not in Epic for cardiology review, though ED MD read out is not acute. No imaging  was performed. BP was noted to be 134/58. ED MD noted frequent PVCs on telemetry. She was given IV metoprolol in the ED and started on PO metoprolol along with discontinuation of Cardura and hydralazine. Patient called our office to schedule a follow up visit and was noted to be somewhat anxious. Upon my chart review on 10/1, it was noted the patient had apparently self discontinued clonidine. She was advised to take 0.1 mg bid for SBP > 140 mmHg. She was also advised to contact her PCP for discussion of possible SSRI and to follow up on her mildly elevated TSH.  She comes in today feeling much better than she was feeling the prior day. She reports waking up this morning and "felt great." She has tolerated metoprolol without issues. She did resume clonidine on the evening of 10/1. She has not yet resumed losartan. No chest pain, headache, slurred speech, or focal deficit. Asks for "testing for my blood pressure." She denies any alcohol, tobacco, or illegal drugs. She drinks one cup of coffee in the morning. She eats a diet high in vegetables including beets and omega-3 fatty acids. She does not apply salt to her food and does not eat out at restaurants.     Past Medical History:  Diagnosis Date  . Allergy   . Anemia   . Anxiety   . Arthritis   . Bilateral cataracts    immature  . Chronic sinusitis    takes Cetirizine daily  . Depression    doesn't take any meds  . Diabetes mellitus without complication (HCC)    borderline  .  Diverticulosis   . Dry eyes   . GERD (gastroesophageal reflux disease)    TAKES OMEPRAZOLE DAILY  . Hashimoto's thyroiditis   . History of blood transfusion    in the 60's no abnormal reaction noted  . History of echocardiogram    a. 05/2011: EF 60%, trace AI, trace MR, mild TR  . Hyperlipidemia    a. self discontinued statin  . Hypertension   . Insomnia    takes Trazodone nightly  . Medication intolerance   . OSA (obstructive sleep apnea)    doesn't use a cpap    . Osteoporosis    takes Vit D as needed  . PONV (postoperative nausea and vomiting)    hard to wake up    Past Surgical History:  Procedure Laterality Date  . CHOLECYSTECTOMY  12/07/2012   Dr Lucia Gaskins  . CHOLECYSTECTOMY N/A 12/07/2012   Procedure: LAPAROSCOPIC CHOLECYSTECTOMY WITH INTRAOPERATIVE CHOLANGIOGRAM;  Surgeon: Shann Medal, MD;  Location: [facility];  Service: General;  Laterality: N/A;  . LIPOSUCTION    . NASAL SINUS SURGERY  2006   x 2  . right arm surgery  1960  . TCS      Current Meds  Medication Sig  . Artificial Tear Ointment (REFRESH LACRI-LUBE) OINT Apply 1 application to eye at bedtime.  . B-D ULTRA-FINE 33 LANCETS MISC Use as directed. Dx 250.0  . Blood Glucose Monitoring Suppl (FIFTY50 GLUCOSE METER 2.0) W/DEVICE KIT USE AS DIRECTED  . Blood Glucose Monitoring Suppl (ONE TOUCH ULTRA MINI) W/DEVICE KIT USE AS DIRECTED  . calcium carbonate (OS-CAL - DOSED IN MG OF ELEMENTAL CALCIUM) 1250 (500 CA) MG tablet Take by mouth.  . carboxymethylcellulose (REFRESH PLUS) 0.5 % SOLN Place 1 drop into both eyes 3 (three) times daily as needed (for dry eyes).   . Cholecalciferol (VITAMIN D3) 1000 UNITS CAPS Take by mouth.  . cloNIDine (CATAPRES) 0.1 MG tablet TAKE 1 TABLET BY MOUTH 3 TIMES DAILY.  Marland Kitchen Cyanocobalamin 1000 MCG TBCR Take by mouth.  Marland Kitchen glucose blood (CHOICE DM FORA G20 TEST STRIPS) test strip Use as instructed  . metFORMIN (GLUCOPHAGE) 500 MG tablet Take 1 tablet (500 mg total) by mouth 2 (two) times daily with a meal. (Patient taking differently: Take 500 mg by mouth daily. )  . metoprolol tartrate (LOPRESSOR) 25 MG tablet Take 1 tablet (25 mg total) by mouth as needed. For systolic blood pressure > 140  . ONETOUCH DELICA LANCETS 54M MISC Use as directed. Dx 250.0  . rosuvastatin (CRESTOR) 5 MG tablet Take 1 tablet (5 mg total) by mouth daily.  . [DISCONTINUED] metoprolol tartrate (LOPRESSOR) 25 MG tablet Take 1 tablet (25 mg total) by mouth 2 (two) times daily.     Allergies:   Buprenorphine hcl; Codeine; Mold extract [trichophyton mentagrophyte]; Morphine and related; Oxycodone; Oxycodone hcl; Oxycodone hcl; Sumycin [tetracycline hcl]; Cardura [doxazosin mesylate]; Chlorphen-phenyleph-asa; Decongest-aid [pseudoephedrine]; Esomeprazole magnesium; Esomeprazole magnesium; Hydralazine; Hydrochlorothiazide; Indomethacin; Procaine hcl; Keflex [cephalexin]; Latex; Neomycin-bacitracin zn-polymyx; Pseudoephedrine hcl er; Sudafed pe cold & cough child  [phenylephrine-dm]; Telithromycin; and Tetracycline   Social History:  The patient  reports that she has never smoked. She has never used smokeless tobacco. She reports that she does not drink alcohol or use drugs.   Family History:  The patient's family history includes Alcohol abuse in her father; Arthritis in her mother; Cancer in her mother; Early death in her father; Heart disease in her mother; Hyperlipidemia in her mother; Hypertension in her mother; Stroke in her  mother.  ROS:   Review of Systems  Constitutional: Positive for malaise/fatigue. Negative for chills, diaphoresis, fever and weight loss.  HENT: Negative for congestion.   Eyes: Negative for discharge and redness.  Respiratory: Negative for cough, hemoptysis, sputum production, shortness of breath and wheezing.   Cardiovascular: Negative for chest pain, palpitations, orthopnea, claudication, leg swelling and PND.  Gastrointestinal: Negative for abdominal pain, blood in stool, heartburn, melena, nausea and vomiting.  Genitourinary: Negative for hematuria.  Musculoskeletal: Negative for falls and myalgias.  Skin: Negative for rash.  Neurological: Negative for dizziness, tingling, tremors, sensory change, speech change, focal weakness, loss of consciousness and weakness.  Endo/Heme/Allergies: Does not bruise/bleed easily.  Psychiatric/Behavioral: Negative for substance abuse. The patient is nervous/anxious.   All other systems reviewed and are  negative.    PHYSICAL EXAM:  VS:  BP 122/64 (BP Location: Left Arm, Patient Position: Sitting, Cuff Size: Normal)   Pulse (!) 59   Ht '5\' 5"'  (1.651 m)   Wt 145 lb (65.8 kg)   BMI 24.13 kg/m  BMI: Body mass index is 24.13 kg/m.  Physical Exam  Constitutional: She is oriented to person, place, and time. She appears well-developed and well-nourished.  HENT:  Head: Normocephalic and atraumatic.  Eyes: Right eye exhibits no discharge. Left eye exhibits no discharge.  Neck: Normal range of motion. No JVD present.  Cardiovascular: Normal rate, regular rhythm, S1 normal and S2 normal.  Exam reveals no distant heart sounds, no friction rub, no midsystolic click and no opening snap.   Murmur heard.  Harsh midsystolic murmur is present with a grade of 1/6  at the upper right sternal border radiating to the neck Pulmonary/Chest: Effort normal and breath sounds normal. No respiratory distress. She has no decreased breath sounds. She has no wheezes. She has no rales. She exhibits no tenderness.  Abdominal: Soft. She exhibits no distension. There is no tenderness.  Musculoskeletal: She exhibits no edema.  Neurological: She is alert and oriented to person, place, and time.  Skin: Skin is warm and dry. No cyanosis. Nails show no clubbing.  Psychiatric: She has a normal mood and affect. Her speech is normal and behavior is normal. Judgment and thought content normal.     EKG:  Was ordered and interpreted by me today. Shows sinus bradycardia, 59 bpm, no acute st/t changes   Recent Labs: 10/25/2016: BUN 15; Creatinine, Ser 0.77; Hemoglobin 14.2; Platelets 237; Potassium 3.6; Sodium 139; TSH 6.756  No results found for requested labs within last 8760 hours.   Estimated Creatinine Clearance: 53.8 mL/min (by C-G formula based on SCr of 0.77 mg/dL).   Wt Readings from Last 3 Encounters:  10/26/16 145 lb (65.8 kg)  10/25/16 145 lb (65.8 kg)  10/22/16 145 lb (65.8 kg)     Other studies  reviewed: Additional studies/records reviewed today include: summarized above  ASSESSMENT AND PLAN:  1. Essential hypertension:  -Patient with numerous medication intolerances and noncompliance (self titration and self discontinuation of medications)  -Her hypertension will be difficult to manage in this setting  -Her elevated readings are likely multifactorial including self treatment/titration of medications, severe anxiety with sleep interruptions to check BP in the middle of the night, and sleep apnea -Discussed multiple treatment options -She would like to continue clonidine (restarted on the PM of 10/1) and restart losartan 100 mg (she refused a smaller starting dose) -She will take metoprolol 25 mg bid prn for SBP > 140 mmHg and HR > 65 bpm  -Stop  Cardura and hydralazine, these will be added as intolerances  -Recommend she follow up with her sleep apnea MD  -Needs to follow up with PCP regarding her mildly elevated TSH  -Check renin/aldosterone level and cortisol level -Check renal artery ultrasound  -Check magnesium   2. Multiple medication intolerances:  -Overall, this is a difficult situation   3. Severe anxiety:  -Needs to follow up with PCP for further treatment   4. Elevated TSH:  -Needs follow up with PCP   5. Sleep apnea:  -Reports compliance with CPAP  6. Cardiac murmur: -Check echo  7. Carotid artery disease:  -Self discontinued Crestor  -Dr. Rockey Situ planning for carotid artery ultrasound at a later date, defer to him   8. HLD:  -Has previously been advised to restart Crestor    Disposition: F/u with Dr. Rockey Situ, MD in 3 months.   Current medicines are reviewed at length with the patient today.  The patient did not have any concerns regarding medicines.  Melvern Banker PA-C 10/26/2016 4:36 PM     [address] [place], [place] [address] [phone]

## 2016-10-27 ENCOUNTER — Telehealth: Payer: Self-pay | Admitting: Cardiovascular Disease

## 2016-10-27 NOTE — Telephone Encounter (Signed)
Patient calling to let us know she is worried her continued high B   Last night it was 209/70 and this morning it is 140/60   Please call to discuss care plan

## 2016-10-27 NOTE — Telephone Encounter (Signed)
Patient with multiple medication intolerances including lisinopril, HCTZ, Norvasc, hydralazine, and Cardura. Currently on clonidine 0.1 mg tid and restarted losartan 100 mg on 10/2. Has a history of self titration and self discontinuing medications which further complicates her management. There is a significant component of anxiety in play which needs to be addressed by the patient's PCP. She would greatly benefit from an SSRI. Dr. Rockey Situ has also felt the patient would benefit from an SSRI. The patient's BP was well controlled in the ED when she was seen on 10/1 and in our office on 10/2. I would like for the patient to bring in her BP cuff for comparison to ours.   If patient feels like losartan "does not work" this further limits our ability to effectively manage her hypertension. I am not going to advise she discontinue losartan as there is no objective information stating she is not tolerating this medication (awaiting labs). If she is truly noting BP elevations in the evening, she can increase clonidine to 0.2 mg for her afternoon and evening doses. I am hesitant to add Imdur given her numerous medication intolerances.   Recommend patient follow up with PCP for possible SSRI. She may also benefit from CPAP titration.   Lastly, there is mixed data regarding renal denervation for resistant hypertension.

## 2016-10-27 NOTE — Telephone Encounter (Signed)
Spoke with patient and she states that when she got home last night her blood pressure was 209/70 and took clonidine which didn't help much. Patient took toprol and it continued to be high. She states that what she is on is just not working for her blood pressures. Spoke with her at length and she was asking about lisinopril instead of the losartan. Reviewed at length that when starting medications it will take time to see if helps with controlling her pressures. She just saw the physician assistant yesterday and he had her resume her losartan and then take metoprolol as well. She states that the losartan does not do anything for her and she wants something else. Let her know that I would route message for them to review and we would give her a call back with any recommendations. She verbalized understanding with no further questions at this time.

## 2016-10-28 ENCOUNTER — Other Ambulatory Visit: Payer: Self-pay | Admitting: Physician Assistant

## 2016-10-28 DIAGNOSIS — I10 Essential (primary) hypertension: Secondary | ICD-10-CM

## 2016-10-28 DIAGNOSIS — I272 Pulmonary hypertension, unspecified: Secondary | ICD-10-CM

## 2016-10-28 NOTE — Telephone Encounter (Signed)
Left voicemail message to call back  

## 2016-10-28 NOTE — Telephone Encounter (Signed)
Spoke with patient and reviewed recommendations that she should check with her PCP for possible anxiety medication. Also reviewed due to her numerous medication intolerances we need to use caution. Instructed her to increase clonidine to 0.2 mg for her evening and bedtime doses. She read back all instructions and verbalized understanding. Advised her to continue monitoring and call back if she continues to have problems. She was appreciative for the call with no further questions.

## 2016-11-02 ENCOUNTER — Ambulatory Visit (INDEPENDENT_AMBULATORY_CARE_PROVIDER_SITE_OTHER): Payer: PPO

## 2016-11-02 ENCOUNTER — Other Ambulatory Visit: Payer: Self-pay

## 2016-11-02 DIAGNOSIS — I272 Pulmonary hypertension, unspecified: Secondary | ICD-10-CM

## 2016-11-02 DIAGNOSIS — R011 Cardiac murmur, unspecified: Secondary | ICD-10-CM

## 2016-11-02 DIAGNOSIS — I10 Essential (primary) hypertension: Secondary | ICD-10-CM

## 2016-11-03 NOTE — Progress Notes (Signed)
Its because we changed the reading system, don't call her yet until its finalized

## 2016-11-05 ENCOUNTER — Other Ambulatory Visit: Payer: Self-pay

## 2016-11-05 DIAGNOSIS — E7800 Pure hypercholesterolemia, unspecified: Secondary | ICD-10-CM

## 2016-11-09 ENCOUNTER — Ambulatory Visit (INDEPENDENT_AMBULATORY_CARE_PROVIDER_SITE_OTHER): Payer: PPO | Admitting: Neurology

## 2016-11-09 ENCOUNTER — Encounter: Payer: Self-pay | Admitting: Neurology

## 2016-11-09 VITALS — BP 126/64 | HR 65 | Ht 62.0 in | Wt 145.0 lb

## 2016-11-09 DIAGNOSIS — F518 Other sleep disorders not due to a substance or known physiological condition: Secondary | ICD-10-CM | POA: Diagnosis not present

## 2016-11-09 DIAGNOSIS — G4733 Obstructive sleep apnea (adult) (pediatric): Secondary | ICD-10-CM | POA: Diagnosis not present

## 2016-11-09 DIAGNOSIS — Z9989 Dependence on other enabling machines and devices: Secondary | ICD-10-CM

## 2016-11-09 DIAGNOSIS — F5104 Psychophysiologic insomnia: Secondary | ICD-10-CM | POA: Diagnosis not present

## 2016-11-09 NOTE — Addendum Note (Signed)
Addended by: Larey Seat on: 11/09/2016 11:55 AM   Modules accepted: Orders

## 2016-11-09 NOTE — Patient Instructions (Signed)
Please remember to try to maintain good sleep hygiene, which means: Keep a regular sleep and wake schedule, try not to exercise or have a meal within 2 hours of your bedtime, try to keep your bedroom conducive for sleep, that is, cool and dark, without light distractors such as an illuminated alarm clock, and refrain from watching TV right before sleep or in the middle of the night and do not keep the TV or radio on during the night. Also, try not to use or play on electronic devices at bedtime, such as your cell phone, tablet PC or laptop. If you like to read at bedtime on an electronic device, try to dim the background light as much as possible. Do not eat in the middle of the night.   We have discussed your  sleep study, wich was normal .    For chronic insomnia, you are best followed by a psychiatrist and/or sleep psychologist.       Insomnia Insomnia is a sleep disorder that makes it difficult to fall asleep or to stay asleep. Insomnia can cause tiredness (fatigue), low energy, difficulty concentrating, mood swings, and poor performance at work or school. There are three different ways to classify insomnia:  Difficulty falling asleep.  Difficulty staying asleep.  Waking up too early in the morning.  Any type of insomnia can be long-term (chronic) or short-term (acute). Both are common. Short-term insomnia usually lasts for three months or less. Chronic insomnia occurs at least three times a week for longer than three months. What are the causes? Insomnia may be caused by another condition, situation, or substance, such as:  Anxiety.  Certain medicines.  Gastroesophageal reflux disease (GERD) or other gastrointestinal conditions.  Asthma or other breathing conditions.  Restless legs syndrome, sleep apnea, or other sleep disorders.  Chronic pain.  Menopause. This may include hot flashes.  Stroke.  Abuse of alcohol, tobacco, or illegal  drugs.  Depression.  Caffeine.  Neurological disorders, such as Alzheimer disease.  An overactive thyroid (hyperthyroidism).  The cause of insomnia may not be known. What increases the risk? Risk factors for insomnia include:  Gender. Women are more commonly affected than men.  Age. Insomnia is more common as you get older.  Stress. This may involve your professional or personal life.  Income. Insomnia is more common in people with lower income.  Lack of exercise.  Irregular work schedule or night shifts.  Traveling between different time zones.  What are the signs or symptoms? If you have insomnia, trouble falling asleep or trouble staying asleep is the main symptom. This may lead to other symptoms, such as:  Feeling fatigued.  Feeling nervous about going to sleep.  Not feeling rested in the morning.  Having trouble concentrating.  Feeling irritable, anxious, or depressed.  How is this treated? Treatment for insomnia depends on the cause. If your insomnia is caused by an underlying condition, treatment will focus on addressing the condition. Treatment may also include:  Medicines to help you sleep.  Counseling or therapy.  Lifestyle adjustments.  Follow these instructions at home:  Take medicines only as directed by your health care provider.  Keep regular sleeping and waking hours. Avoid naps.  Keep a sleep diary to help you and your health care provider figure out what could be causing your insomnia. Include: ? When you sleep. ? When you wake up during the night. ? How well you sleep. ? How rested you feel the next day. ? Any side  effects of medicines you are taking. ? What you eat and drink.  Make your bedroom a comfortable place where it is easy to fall asleep: ? Put up shades or special blackout curtains to block light from outside. ? Use a white noise machine to block noise. ? Keep the temperature cool.  Exercise regularly as directed by  your health care provider. Avoid exercising right before bedtime.  Use relaxation techniques to manage stress. Ask your health care provider to suggest some techniques that may work well for you. These may include: ? Breathing exercises. ? Routines to release muscle tension. ? Visualizing peaceful scenes.  Cut back on alcohol, caffeinated beverages, and cigarettes, especially close to bedtime. These can disrupt your sleep.  Do not overeat or eat spicy foods right before bedtime. This can lead to digestive discomfort that can make it hard for you to sleep.  Limit screen use before bedtime. This includes: ? Watching TV. ? Using your smartphone, tablet, and computer.  Stick to a routine. This can help you fall asleep faster. Try to do a quiet activity, brush your teeth, and go to bed at the same time each night.  Get out of bed if you are still awake after 15 minutes of trying to sleep. Keep the lights down, but try reading or doing a quiet activity. When you feel sleepy, go back to bed.  Make sure that you drive carefully. Avoid driving if you feel very sleepy.  Keep all follow-up appointments as directed by your health care provider. This is important. Contact a health care provider if:  You are tired throughout the day or have trouble in your daily routine due to sleepiness.  You continue to have sleep problems or your sleep problems get worse. Get help right away if:  You have serious thoughts about hurting yourself or someone else. This information is not intended to replace advice given to you by your health care provider. Make sure you discuss any questions you have with your health care provider. Document Released: 01/09/2000 Document Revised: 06/13/2015 Document Reviewed: 10/12/2013 Elsevier Interactive Patient Education  [employer].

## 2016-11-09 NOTE — Progress Notes (Addendum)
SLEEP MEDICINE CLINIC   Provider:  Larey Seat, [place] D  Primary Care Physician:  Dion Body, MD   Referring Provider: Dion Body, MD    Chief Complaint  Patient presents with  . Follow-up    pt alone, rm 10. pt is stating that she is struggling with sleep night and she is not understanding why her sleep reports are normal when her BP is elevated. pt BP is elevated at night .     HPI:  Yvonne Davidson is a 76 y.o. female , seen here as in a revisit  from Dr. Netty Starring for a re-evaluation of her sleep .  11-09-2016, Patient telling me all about radioactive emissions from her smart electricity meter. She feels it interferes with her sleep. I will review today the patient's polysomnography from 08/18/2016, the patient had a role foggy on CPAP, but tolerated during the sleep study a CPAP pressure of 9 cm very well with no residual apneas being found. The study was valid for M SLT to follow. The preceding polysomnography had a total sleep time of 418 recorded minutes. She did not have REM sleep onset, her mean sleep latency was well over 10 minutes, and 2 nap opportunities of the 5 nap MSLT did not contain any sleep at all.  Narcolepsy could not be diagnosed by the gold standard test.  I also reviewed her cardiologist's note, Dr. Ida Rogue, indicated that she is followed for her OSA at [facility], has chronic pain in her legs GERD, hyperlipidemia hypertension and diabetes mellitus and is followed by him for blood pressure and carotid disease. She had seen the emergency room for hypertension on 10/21/2016. She has just below 80% carotid disease on the left and below 60% disease on the right. She is taking clonidine or 0.1 mg up to 4 times daily. She indicated that she wears a mouth piece design but [facility] dentistry for chronic sleep apnea but continues to wear CPAP with a nasal pillow and a chinstrap.  States: She is "crippled" in her life by poor sleep.  I  reviewed her list of medications. I cannot find an explanation for her repeated describes sleep attacks, dream intrusions, vivid and lucid dreams, and have to resort to psychological- psychiatric explanation.      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 She struggles with significant fatigue and excessive daytime sleepiness endorsing the Epworth sleepiness score at 20 points. She had undergone an evaluation with Dr. 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 She endorsed insomnia, sleepiness, snoring, restless legs, fatigue, palpitations, hearing loss, allergic rhinitis, decreased energy and the feeling of never getting enough sleep. She sleeps with her head of bed elevated- she tries to sleep actually in a seated position to allow her to breathe deeper. She would describes that she gulps air swallows air, she was evaluated for sleep apnea and has been compliant with CPAP use. She was diagnosed with sleep apnea first in the year 2000 after she had been told that she was a loud snorer. She was referred to Dr. Annamaria Boots he did a polysomnography at the time and she was told that her obstructive sleep apnea may be in moderate range. She started on CPAP but she had aerophagia. This continued to be a problem in spite of trying CPAP repeatedly and was different interfaces.  She has tried an oral appliance - actually two. None of them treated the  snoring sufficiently and none of them gave her energy or refreshing restorative sleep.  She no longer sleeps with her husband due to her snoring. Whenever she is physically inactive and mentally not stimulated, she will go to sleep. She can recall being asleep within seconds even when she was young, very slender and physically very active.  Her sleep at the time was of good quality and she would usually wake up refreshed but she craves naps even than.She would have dream hallucinations and dream intrusions  hypnagogic or hypnopompic, sleep paralysis. 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 Her dreams are vivid and may continue and several installments after she wakes up. The most scary dreams all those when she feels the presence of another being in the room but cannot move to get away. These have affected her all her life.  She struggles with depression, hypertension and diabetes she is status post sinus surgery in 1991 and 2006 and had an abdominal cholecystectomy in 2015.  Sleep habits are as follows: Usually she is in bed by 10 PM the latest at 11. She sleeps in a recliner not in bed. She has not been gulping air while on CPAP when seated. She can usually sleep 4-5 hours. She feels that her breathing stops not just at night but also in the middle of the day. She has been scared to sleep. She tends to hold her breath when she concentrates on something. She can go to sleep fast but she does not stay asleep she keeps waking up in intervals is rarely asleep on block for longer than 2 hours, she wakes up about every 90 minutes or so. She has 2 bathroom breaks on average each night she rises in the morning at 8 AM and she keeps this is a regular rise time in spite of not having had the same sleep at night. Naps every day 30 minutes.   Sleep medical history and family sleep history:  She is not aware of any sleep disorders affecting her siblings or parents, her father drank and snored-  . She underwent 2 sinus surgeries, she did not undergo tonsillectomy. She has macroglossia. She was a sleep walker.  She has been diagnosed with obstructive sleep apnea for the first time in the year 2000 and then has been worked up by Dr. Nehemiah Settle again. She was for a while followed at [facility]. 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 The overall AHI was 14.6 but there was loud snoring associated. There was no Cheyne-Stokes breathing noted she had an equal amount of obstructive and central apneas. Oxygen nadir SPO2 was 87% her respiratory events were clearly worse in supine sleep and she did have frequent periodic limb movements she had a normal sleep efficiency and normal REM sleep but sleep was fragmented with respiratory arousals. Her EKG remained in normal sinus rhythm. Dr. Radene Knee brought that the patient had previously failed to tolerate CPAP and was at that time not able to try CPAP again he referred for the dental appliance. Region used for oral appliance for a while while sleeping in a recliner she also used a chin strap and CPAP at the time, truly uncomfortable. She reached a compliance of 97% on CPAP with  7 hours and 17 minutes nocturnal use, the pressure was set at 7.5 cm with 2 cm EPR and the residual AHI was 0.8 which is a good result but the patient did not sleep. Dr. Maxwell Caul then changed her to an AutoSet between 4 and 16 cm water with 2 cm EPR, the 95th percentile was 14 cm water, residual AHI was 1.7 without treatment emergent central apnea. She did not have major air leaks. 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   Social history:  Married, retired, non smoker- quit 45 years ago. ETOH - one beer a month. Caffeine -  Hot tea 2 cups a day.   Review of Systems: Out of a complete 14 system review, the patient complains of only the following symptoms, and all other reviewed systems are negative. Snoring, dreams, cataplexy, sleep attacks, OSA. Palpitations.    Epworth score 20 , Fatigue severity score 59 , depression score 6/15   Social History   Social History  . Marital status: Married      Spouse name: N/A  . Number of children: 4  . Years of education: N/A   Occupational History  . EM [employer]    Social History Main Topics  . Smoking status: Never Smoker  . Smokeless tobacco: Never Used     Comment: quit in 1978  . Alcohol use No  . Drug use: No  . Sexual activity: Not on file   Other Topics Concern  . Not on file   Social History Narrative   Lives with friend. Has 4 children.    Family History  Problem Relation Age of Onset  . Arthritis Mother   . Heart disease Mother   . Hyperlipidemia Mother   . Hypertension Mother   . Stroke Mother   . Cancer Mother        Breast & Uterine - 20'-30's  . Early death Father        Fire  . Alcohol abuse Father     Past Medical History:  Diagnosis Date  . Allergy   . Anemia   . Anxiety   . Arthritis   . Bilateral cataracts    immature  . Chronic sinusitis    takes Cetirizine daily  . Depression    doesn't take any meds  . Diabetes mellitus without complication (HCC)    borderline  . Diverticulosis   . Dry eyes   . GERD (gastroesophageal reflux disease)    TAKES OMEPRAZOLE DAILY  . Hashimoto's thyroiditis   . History of blood transfusion    in the 60's no abnormal reaction noted  . History of echocardiogram    a. 05/2011: EF 60%, trace AI, trace MR, mild TR  . Hyperlipidemia    a. self discontinued statin  . Hypertension   . Insomnia    takes Trazodone nightly  . Medication intolerance   . OSA (obstructive sleep apnea)    doesn't use a cpap  . Osteoporosis    takes Vit D as needed  . PONV (postoperative nausea and vomiting)    hard to wake up    Past Surgical History:  Procedure Laterality Date  . CHOLECYSTECTOMY  12/07/2012   Dr Lucia Gaskins  . CHOLECYSTECTOMY N/A 12/07/2012   Procedure: LAPAROSCOPIC CHOLECYSTECTOMY WITH INTRAOPERATIVE CHOLANGIOGRAM;  Surgeon: Shann Medal, MD;  Location: [facility] OR;  Service: General;  Laterality: N/A;  . LIPOSUCTION    . NASAL SINUS SURGERY  2006    x 2  . right arm surgery  1960  . TCS      Current Outpatient Prescriptions  Medication Sig Dispense Refill  . Artificial Tear Ointment (REFRESH LACRI-LUBE) OINT Apply 1 application to eye at bedtime.    . B-D ULTRA-FINE 33 LANCETS MISC Use as directed. Dx 250.0    . Blood Glucose Monitoring Suppl (FIFTY50 GLUCOSE METER 2.0) W/DEVICE KIT USE AS DIRECTED    . Blood Glucose Monitoring Suppl (ONE TOUCH ULTRA MINI) W/DEVICE KIT USE AS DIRECTED 1 each 0  . calcium carbonate (OS-CAL - DOSED IN MG OF ELEMENTAL CALCIUM) 1250 (500 CA) MG tablet Take by mouth.    . carboxymethylcellulose (REFRESH PLUS) 0.5 % SOLN Place 1 drop into both eyes 3 (three) times daily as needed (for dry eyes).     . Cholecalciferol (VITAMIN D3) 1000 UNITS CAPS Take by mouth.    . cloNIDine (CATAPRES) 0.1 MG tablet TAKE 1 TABLET BY MOUTH 3 TIMES DAILY. 270 tablet 3  . Cyanocobalamin 1000 MCG TBCR Take by mouth.    Marland Kitchen glucose blood (CHOICE DM FORA G20 TEST STRIPS) test strip Use as instructed 100 each 12  . losartan (COZAAR) 100 MG tablet Take 1 tablet (100 mg total) by mouth daily. 30 tablet 6  . metFORMIN (GLUCOPHAGE) 500 MG tablet Take 1 tablet (500 mg total) by mouth 2 (two) times daily with a meal. (Patient taking differently: Take 500 mg by mouth daily. ) 60 tablet 3  . metoprolol tartrate (LOPRESSOR) 25 MG tablet Take 1 tablet (25 mg total) by mouth as needed. For systolic blood pressure > 140 30 tablet 6  . ONETOUCH DELICA LANCETS 20B MISC Use as directed. Dx 250.0 100 each 3  . rosuvastatin (CRESTOR) 5 MG tablet Take 1 tablet (5 mg total) by mouth daily. 30 tablet 11   No current facility-administered medications for this visit.     Allergies as of 11/09/2016 - Review Complete 11/09/2016  Allergen Reaction Noted  . Buprenorphine hcl Anaphylaxis 07/01/2014  . Codeine Shortness Of Breath   . Mold extract [trichophyton mentagrophyte] Shortness Of Breath 04/28/2011  . Morphine and related Anaphylaxis 12/12/2012   . Oxycodone Shortness Of Breath 07/01/2014  . Oxycodone hcl Anaphylaxis   . Oxycodone hcl Anaphylaxis 07/01/2014  . Sumycin [tetracycline hcl] Anaphylaxis 04/28/2011  . Cardura [doxazosin mesylate]  10/26/2016  . Chlorphen-phenyleph-asa  07/01/2014  . Decongest-aid [pseudoephedrine]  03/14/2013  . Esomeprazole magnesium Other (See Comments)   . Esomeprazole magnesium  07/01/2014  . Hydralazine  10/26/2016  . Hydrochlorothiazide Other (See Comments) 04/28/2011  . Indomethacin Other (See Comments)   . Procaine hcl Other (See Comments) 04/28/2011  . Keflex [cephalexin] Rash 05/17/2012  . Latex Rash 11/29/2012  . Neomycin-bacitracin zn-polymyx Rash 04/28/2011  . Pseudoephedrine hcl er Palpitations 04/28/2011  . Sudafed pe cold & cough child  [phenylephrine-dm] Palpitations 07/01/2014  . Telithromycin Rash   . Tetracycline Rash     Vitals: BP 126/64   Pulse 65   Ht '5\' 2"'  (1.575 m)   Wt 145 lb (65.8 kg)   BMI 26.52 kg/m  Last Weight:  Wt Readings from Last 1 Encounters:  11/09/16 145 lb (65.8 kg)   TDH:RCBU mass index is 26.52 kg/m.     Last Height:   Ht Readings from Last 1 Encounters:  11/09/16 '5\' 2"'  (1.575 m)  Physical exam:  General: The patient is awake, alert and appears not in acute distress. The patient is well groomed. Head: Normocephalic, atraumatic. Neck is supple. Mallampati 5 with macroglossia. ,  neck circumference:15. 25 . Nasal airflow patent , TMJ-right click still  evident. Retrognathia is not seen.  Cardiovascular:  Regular rate and rhythm, without  murmurs or carotid bruit, and without distended neck veins. Respiratory: Lungs are clear to auscultation. Skin:  Without evidence of edema, or rash Trunk: BMI is 27 .  Neurologic exam :The patient is awake and alert, oriented to place and time.   Memory subjective described as intact. Attention span & concentration ability appears normal.  Speech is fluent,  without  dysarthria, dysphonia or aphasia.   Mood and affect are appropriate.  Cranial nerves: Pupils are equal and briskly reactive to light.   Facial sensation intact to fine touch. Facial motor strength is symmetric and tongue and uvula move midline. Shoulder shrug was symmetrical.   Motor exam:   Normal tone, muscle bulk and symmetric strength in all extremities. Deep tendon reflexes: in the  upper and lower extremities are symmetric and intact. Babinski maneuver response is downgoing.  Assessment:  After physical and neurologic examination, review of laboratory studies,  Personal review of imaging studies, reports of other /same  Imaging studies, results of polysomnography and / or neurophysiology testing and pre-existing records as far as provided in visit., my assessment is   1) OSA: the patient has been diagnosed with sleep apnea a few years ago, had a painful [place] to finally get the apnea under control and learn to accept CPAP. She is doing well  numerically on the current CPAP 9 cm water.   Dental device from [facility] alone didn't work, she needed a chinstrap for while she is now using a full face mask and her apnea is alleviated.   She still has problems not sleeping well at night ( subjective) , described vivid dreams that are sometimes so intrusive that she has difficulties differentiating reality from dream. Some of her dream hallucinations before paralyzed, and when she takes a deep loud laughter she may find herself weak and  her knees will buckle. This is why we ordered a narcolepsy evaluation by HLA and MSLT.  All work up returned negative!   I cannot find an explanation for her hypertension in her sleep apnea- but it may be her insomnia - psychological state. She should see a cognitive behavioral therapist for insomnia.   I spent more than 30 minutes of face to face time with the patient. Greater than 50% of time was spent in counseling and coordination of care. We have discussed the diagnosis and differential  and I answered the patient's questions.    Plan:  Treatment plan and additional workup : referral back to PCP and recommendation to see psychologist/ behavioral therapist. I will follow for OSA only, she wants a full face mask by dream wear , not sure how much this will costs. 169 USD estimate.       Larey Seat, MD 03/19/3610, 24:49 AM  Certified in Neurology by ABPN Certified in [place] by [facility] [address] [place], [place] [address]

## 2016-11-11 ENCOUNTER — Telehealth: Payer: Self-pay | Admitting: Cardiovascular Disease

## 2016-11-11 MED ORDER — LOSARTAN POTASSIUM 100 MG PO TABS: 100.0000 mg | ORAL_TABLET | Freq: Every day | ORAL | 6 refills | Status: DC

## 2016-11-11 MED ORDER — METOPROLOL TARTRATE 25 MG PO TABS: 25.0000 mg | ORAL_TABLET | ORAL | 6 refills | Status: DC | PRN

## 2016-11-11 NOTE — Telephone Encounter (Signed)
Pt calling stating she now uses [facility] [address]  It was her Losartan and Propanolol   She states we sent them to [place] by accident   Please advise.

## 2016-11-11 NOTE — Telephone Encounter (Signed)
Spoke with patient she verified that the [facility] was [facility] [address] here in [place]. She stated she needed Losartan and Metprolol that she needed refilled.

## 2016-11-11 NOTE — Telephone Encounter (Signed)
Requested Prescriptions   Signed Prescriptions Disp Refills  . losartan (COZAAR) 100 MG tablet 30 tablet 6    Sig: Take 1 tablet (100 mg total) by mouth daily.    Authorizing Provider: [name]    Ordering User: Janan Ridge metoprolol tartrate (LOPRESSOR) 25 MG tablet 30 tablet 6    Sig: Take 1 tablet (25 mg total) by mouth as needed. For systolic blood pressure > 140    Authorizing Provider: [name]    Ordering User: Janan Ridge

## 2016-11-15 ENCOUNTER — Telehealth: Payer: Self-pay

## 2016-11-15 NOTE — Telephone Encounter (Signed)
S/w pt. Offered 10/23, 2:40 appt w/Dr. Fletcher Anon. She is agreeable though she states she has repairment coming to her home at 11am tomorrow regarding mold and heating unit. States "I will do my best to make it". Explained no show policy to which she replies that she would like this appointment and will be here. Added to schedule.

## 2016-11-15 NOTE — Telephone Encounter (Signed)
Based on results of renal artery duplex, pt needs PV appointment with Dr. Fletcher Anon. Opening tomorrow at 3:40pm. Left message on pt's cell VM to contact the office.

## 2016-11-16 ENCOUNTER — Encounter: Payer: Self-pay | Admitting: Cardiovascular Disease

## 2016-11-16 ENCOUNTER — Ambulatory Visit (INDEPENDENT_AMBULATORY_CARE_PROVIDER_SITE_OTHER): Payer: PPO | Admitting: Cardiovascular Disease

## 2016-11-16 VITALS — BP 150/70 | HR 64 | Ht 62.0 in | Wt 146.0 lb

## 2016-11-16 DIAGNOSIS — E785 Hyperlipidemia, unspecified: Secondary | ICD-10-CM

## 2016-11-16 DIAGNOSIS — I701 Atherosclerosis of renal artery: Secondary | ICD-10-CM | POA: Diagnosis not present

## 2016-11-16 DIAGNOSIS — I10 Essential (primary) hypertension: Secondary | ICD-10-CM | POA: Diagnosis not present

## 2016-11-16 MED ORDER — NEBIVOLOL HCL 5 MG PO TABS: 5.0000 mg | ORAL_TABLET | Freq: Every day | ORAL | 5 refills | Status: DC

## 2016-11-16 NOTE — Progress Notes (Signed)
Cardiology Office Note   Date:  11/16/2016   ID:  Yvonne Davidson, DOB 1940-11-01, MRN [id number]  PCP:  Dion Body, MD  Cardiologist:  Dr. Rockey Situ   Chief Complaint  Patient presents with  . other    New Patient. Based on results of renal artery duplex. Meds reviewed verbally with patient.       History of Present Illness: Yvonne Davidson is a 76 y.o. female who was referred by Dr. Rockey Situ for evaluation of renal artery stenosis.  She has known history of hypertension with intolerance to multiple medications, severe anxiety, carotid artery disease, obstructive sleep apnea, chronic leg pain and GERD. Recent echocardiogram showed normal LV systolic function with mild mitral regurgitation. She had an emergency room visit in September for elevated blood pressure reading of 952 systolic. She underwent renal artery duplex which showed greater than 60% right renal artery stenosis with significant stenosis affecting the celiac artery. She denies any abdominal pain or weight loss.  She currently takes clonidine and losartan for hypertension.  She is supposed to be taking metoprolol as needed but she has not required that medication. She takes red yeast rice for hyperlipidemia.  Past Medical History:  Diagnosis Date  . Allergy   . Anemia   . Anxiety   . Arthritis   . Bilateral cataracts    immature  . Chronic sinusitis    takes Cetirizine daily  . Depression    doesn't take any meds  . Diabetes mellitus without complication (HCC)    borderline  . Diverticulosis   . Dry eyes   . GERD (gastroesophageal reflux disease)    TAKES OMEPRAZOLE DAILY  . Hashimoto's thyroiditis   . History of blood transfusion    in the 60's no abnormal reaction noted  . History of echocardiogram    a. 05/2011: EF 60%, trace AI, trace MR, mild TR  . Hyperlipidemia    a. self discontinued statin  . Hypertension   . Insomnia    takes Trazodone nightly  . Medication intolerance   . OSA  (obstructive sleep apnea)    doesn't use a cpap  . Osteoporosis    takes Vit D as needed  . PONV (postoperative nausea and vomiting)    hard to wake up    Past Surgical History:  Procedure Laterality Date  . CHOLECYSTECTOMY  12/07/2012   Dr Lucia Gaskins  . CHOLECYSTECTOMY N/A 12/07/2012   Procedure: LAPAROSCOPIC CHOLECYSTECTOMY WITH INTRAOPERATIVE CHOLANGIOGRAM;  Surgeon: Shann Medal, MD;  Location: [place];  Service: General;  Laterality: N/A;  . LIPOSUCTION    . NASAL SINUS SURGERY  2006   x 2  . right arm surgery  1960  . TCS       Current Outpatient Prescriptions  Medication Sig Dispense Refill  . Artificial Tear Ointment (REFRESH LACRI-LUBE) OINT Apply 1 application to eye at bedtime.    . B-D ULTRA-FINE 33 LANCETS MISC Use as directed. Dx 250.0    . Blood Glucose Monitoring Suppl (FIFTY50 GLUCOSE METER 2.0) W/DEVICE KIT USE AS DIRECTED    . Blood Glucose Monitoring Suppl (ONE TOUCH ULTRA MINI) W/DEVICE KIT USE AS DIRECTED 1 each 0  . calcium carbonate (OS-CAL - DOSED IN MG OF ELEMENTAL CALCIUM) 1250 (500 CA) MG tablet Take by mouth.    . carboxymethylcellulose (REFRESH PLUS) 0.5 % SOLN Place 1 drop into both eyes 3 (three) times daily as needed (for dry eyes).     . Cholecalciferol (VITAMIN D3)  1000 UNITS CAPS Take by mouth.    . cloNIDine (CATAPRES) 0.1 MG tablet TAKE 1 TABLET BY MOUTH 3 TIMES DAILY. 270 tablet 3  . Cyanocobalamin 1000 MCG TBCR Take by mouth.    Marland Kitchen glucose blood (CHOICE DM FORA G20 TEST STRIPS) test strip Use as instructed 100 each 12  . losartan (COZAAR) 100 MG tablet Take 1 tablet (100 mg total) by mouth daily. 30 tablet 6  . metFORMIN (GLUCOPHAGE) 500 MG tablet Take 1 tablet (500 mg total) by mouth 2 (two) times daily with a meal. (Patient taking differently: Take 500 mg by mouth daily. Patent takes 1/2 tablet by mouth BID) 60 tablet 3  . ONETOUCH DELICA LANCETS 35T MISC Use as directed. Dx 250.0 100 each 3  . nebivolol (BYSTOLIC) 5 MG tablet Take 1 tablet (5  mg total) by mouth daily. 30 tablet 5  . rosuvastatin (CRESTOR) 5 MG tablet Take 1 tablet (5 mg total) by mouth daily. (Patient not taking: Reported on 11/16/2016) 30 tablet 11   No current facility-administered medications for this visit.     Allergies:   Buprenorphine hcl; Codeine; Mold extract [trichophyton mentagrophyte]; Morphine and related; Oxycodone; Oxycodone hcl; Oxycodone hcl; Sumycin [tetracycline hcl]; Cardura [doxazosin mesylate]; Chlorphen-phenyleph-asa; Decongest-aid [pseudoephedrine]; Esomeprazole magnesium; Esomeprazole magnesium; Hydralazine; Hydrochlorothiazide; Indomethacin; Procaine hcl; Keflex [cephalexin]; Latex; Neomycin-bacitracin zn-polymyx; Pseudoephedrine hcl er; Sudafed pe cold & cough child  [phenylephrine-dm]; Telithromycin; and Tetracycline    Social History:  The patient  reports that she has never smoked. She has never used smokeless tobacco. She reports that she does not drink alcohol or use drugs.   Family History:  The patient's family history includes Alcohol abuse in her father; Arthritis in her mother; Cancer in her mother; Early death in her father; Heart disease in her mother; Hyperlipidemia in her mother; Hypertension in her mother; Stroke in her mother.    ROS:  Please see the history of present illness.   Otherwise, review of systems are positive for none.   All other systems are reviewed and negative.    PHYSICAL EXAM: VS:  BP (!) 150/70 (BP Location: Left Arm, Patient Position: Sitting, Cuff Size: Normal)   Pulse 64   Ht 5' 2" (1.575 m)   Wt 146 lb (66.2 kg)   BMI 26.70 kg/m  , BMI Body mass index is 26.7 kg/m. GEN: Well nourished, well developed, in no acute distress  HEENT: normal  Neck: no JVD, carotid bruits, or masses Cardiac: RRR; no murmurs, rubs, or gallops,no edema  Respiratory:  clear to auscultation bilaterally, normal work of breathing GI: soft, nontender, nondistended, + BS MS: no deformity or atrophy  Skin: warm and dry, no  rash Neuro:  Strength and sensation are intact Psych: euthymic mood, full affect   EKG:  EKG is not ordered today.   Recent Labs: 10/25/2016: BUN 15; Creatinine, Ser 0.77; Hemoglobin 14.2; Platelets 237; Potassium 3.6; Sodium 139; TSH 6.756    Lipid Panel    Component Value Date/Time   CHOL 126 03/03/2015 0839   TRIG 62 03/03/2015 0839   HDL 47 03/03/2015 0839   CHOLHDL 2.7 03/03/2015 0839   CHOLHDL 6 01/15/2014 0817   VLDL 39.6 01/15/2014 0817   LDLCALC 67 03/03/2015 0839   LDLDIRECT 156.6 05/17/2012 1544      Wt Readings from Last 3 Encounters:  11/16/16 146 lb (66.2 kg)  11/09/16 145 lb (65.8 kg)  10/26/16 145 lb (65.8 kg)       No flowsheet data found.  ASSESSMENT AND PLAN:  1.  Unilateral renal artery stenosis: I discussed with her that the current indications for renal artery revascularization include recurrent nonischemic pulmonary edema, progressive chronic kidney disease and refractory hypertension in spite of 3 different blood pressure medications including a diuretic. She does not meet any of the above criteria. she is currently on only 2 different blood pressure medications.  2.  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  Continue medical therapy.  3.  Essential hypertension: Blood pressure has been difficult to control mostly due to poor intolerance to multiple medications.  I am going to add Bystolic 5 mg once daily and discontinue metoprolol which she has not been using anyway.  Continue Clonidine and losartan. Another option is spironolactone.  4.  Hyperlipidemia: She is not taking rosuvastatin but does take red yeast rice.    Disposition:   FU with Ryan in 1 month.  Signed,  Kathlyn Sacramento, MD  11/16/2016 3:23 PM    [place]

## 2016-11-16 NOTE — Patient Instructions (Signed)
Medication Instructions:  Your physician has recommended you make the following change in your medication:  STOP taking metoprolol START taking bystolic 5mg  once daily   Labwork: none  Testing/Procedures: none  Follow-Up: Your physician recommends that you schedule a follow-up appointment in: 1 month with Christell Faith, PA-C   Any Other Special Instructions Will Be Listed Below (If Applicable).     If you need a refill on your cardiac medications before your next appointment, please call your pharmacy.

## 2016-11-18 ENCOUNTER — Ambulatory Visit: Payer: Self-pay | Admitting: Neurology

## 2016-11-23 DIAGNOSIS — G4733 Obstructive sleep apnea (adult) (pediatric): Secondary | ICD-10-CM | POA: Diagnosis not present

## 2016-12-02 DIAGNOSIS — H43813 Vitreous degeneration, bilateral: Secondary | ICD-10-CM | POA: Diagnosis not present

## 2016-12-18 ENCOUNTER — Other Ambulatory Visit: Payer: Self-pay | Admitting: Cardiovascular Disease

## 2016-12-21 ENCOUNTER — Encounter: Payer: Self-pay | Admitting: Physician Assistant

## 2016-12-21 ENCOUNTER — Ambulatory Visit: Payer: PPO | Admitting: Physician Assistant

## 2016-12-21 VITALS — BP 128/70 | HR 66 | Ht 62.5 in | Wt 146.0 lb

## 2016-12-21 DIAGNOSIS — I10 Essential (primary) hypertension: Secondary | ICD-10-CM | POA: Diagnosis not present

## 2016-12-21 DIAGNOSIS — R7989 Other specified abnormal findings of blood chemistry: Secondary | ICD-10-CM | POA: Diagnosis not present

## 2016-12-21 DIAGNOSIS — Z789 Other specified health status: Secondary | ICD-10-CM | POA: Diagnosis not present

## 2016-12-21 DIAGNOSIS — I739 Peripheral vascular disease, unspecified: Secondary | ICD-10-CM | POA: Diagnosis not present

## 2016-12-21 DIAGNOSIS — E119 Type 2 diabetes mellitus without complications: Secondary | ICD-10-CM | POA: Diagnosis not present

## 2016-12-21 DIAGNOSIS — F419 Anxiety disorder, unspecified: Secondary | ICD-10-CM | POA: Diagnosis not present

## 2016-12-21 DIAGNOSIS — I701 Atherosclerosis of renal artery: Secondary | ICD-10-CM

## 2016-12-21 DIAGNOSIS — G4733 Obstructive sleep apnea (adult) (pediatric): Secondary | ICD-10-CM | POA: Diagnosis not present

## 2016-12-21 MED ORDER — CLONIDINE HCL 0.1 MG PO TABS: ORAL_TABLET | ORAL | 3 refills | Status: DC

## 2016-12-21 NOTE — Progress Notes (Signed)
Cardiology Office Note Date:  12/21/2016  Patient ID:  Yvonne Davidson, Yvonne Davidson [birth year], MRN [id number] PCP:  Dion Body, MD  Cardiologist:  Dr. Rockey Situ, MD    Chief Complaint: Follow up HTN  History of Present Illness: Yvonne Davidson is a 76 y.o. female with history of DM2, HTN, HLD, multiple medication intolerances (at least 24), severe anxiety, depression, carotid artery disease, OSA on CPAP, chronic leg pain, insomnia, and GERD who presents for follow up of HTN.   Prior echo from 05/2011 showed EF 60%, trace AI, trace MR, mild TR.   Patient with numerous medication intolerances including lisinopril (reaction unclear), HCTZ (pancreatitis), amlodipine (leg swelling), Cardura (palpitations), and hydralazine (palpitations). She is under increased stress and anxiety at home. BP on a regular basis has been 150 or higher, systolic. She had been taking losartan for several years, along with clonidine. She self discontinued losartan in August 2018 (because it was not working). This led to increased BP, mainly at nighttime. She was setting an alarm clock for the middle of the night to take an extra clonidine upon stopping losartan. Patient was seen in the ED on 9/27 for elevated BP readings with SBP 190. EKG was not acute. Troponin negative. CXR not acute. BP self improved to 425 systolic and she was discharged to outpatient follow up. She saw Dr. Rockey Situ on 9/28 and was started on Cardura and hydralazine. She was continued on clonidine and losartan (though patient was not taking losartan). She started Cardura and hydralazine on 9/29, along with self discontinuing clonidine without a taper. She presented to the [facility] ED on 10/1 with palpitations, and stated she went into "cardiac arrest." There was denial of LOC at any time. Labs showed a mildly elevated TSH, unremarkable CBC, K+ 3.6, EKG not in Epic for cardiology review, though ED MD read out is not acute. No imaging was performed. BP was  noted to be 134/58. ED MD noted frequent PVCs on telemetry. She was given IV metoprolol in the ED and started on PO metoprolol along with discontinuation of Cardura and hydralazine. Patient called our office to schedule a follow up visit and was noted to be somewhat anxious. Upon my chart review on 10/1, it was noted the patient had apparently self discontinued clonidine. She was advised to take 0.1 mg bid for SBP > 140 mmHg. She was also advised to contact her PCP for discussion of possible SSRI and to follow up on her mildly elevated TSH. She was seen on 10/2 for ED follow up of HTN and palpitations and was feeling much better at that time. She was tolerating metoprolol without issues and she had resumed clonidine. It was recommended she have a cortisol, aldosterone/renin ratio, along with magnesium. These tests have not been completed for unclear reasons. She echo on 11/02/2016 that showed EF 60-65%, no RWMA, Gr1DD, trivial AI, mild MR, mildly dilated left atrium, PASP could not be estimated, and a trivial pericardial effusion was noted. She underwent renal artery ultrasound that showed > 60% right renal artery stenosis with significant stenosis affecting the celiac artery. Medical management was advised of her unilateral RAS. She was changed from metoprolol to Bystolic. She was advised to continue clonidine and losartan.   She comes in doing well today. Her BP readings are significantly improved in the 956L systolic in the daytime and in the 875I systolic overnight. She is currently taking losartan 100 mg at breakfast followed by clonidine 0.1 mg at 5 PM, clonidine 0.1 mg  at bedtime, and clonidine 0.1 mg at 2 AM. She feels like her DASH diet is working well. She would like to come off clonidine if able. No chest pain.    Past Medical History:  Diagnosis Date  . Allergy   . Anemia   . Anxiety   . Arthritis   . Bilateral cataracts    immature  . Chronic sinusitis    takes Cetirizine daily  .  Depression    doesn't take any meds  . Diabetes mellitus without complication (HCC)    borderline  . Diverticulosis   . Dry eyes   . GERD (gastroesophageal reflux disease)    TAKES OMEPRAZOLE DAILY  . Hashimoto's thyroiditis   . History of blood transfusion    in the 60's no abnormal reaction noted  . History of echocardiogram    a. 05/2011: EF 60%, trace AI, trace MR, mild TR  . Hyperlipidemia    a. self discontinued statin  . Hypertension   . Insomnia    takes Trazodone nightly  . Medication intolerance   . OSA (obstructive sleep apnea)    doesn't use a cpap  . Osteoporosis    takes Vit D as needed  . PONV (postoperative nausea and vomiting)    hard to wake up    Past Surgical History:  Procedure Laterality Date  . CHOLECYSTECTOMY  12/07/2012   Dr Lucia Gaskins  . CHOLECYSTECTOMY N/A 12/07/2012   Procedure: LAPAROSCOPIC CHOLECYSTECTOMY WITH INTRAOPERATIVE CHOLANGIOGRAM;  Surgeon: Shann Medal, MD;  Location: [place];  Service: General;  Laterality: N/A;  . LIPOSUCTION    . NASAL SINUS SURGERY  2006   x 2  . right arm surgery  1960  . TCS      Current Meds  Medication Sig  . Artificial Tear Ointment (REFRESH LACRI-LUBE) OINT Apply 1 application to eye at bedtime.  . B-D ULTRA-FINE 33 LANCETS MISC Use as directed. Dx 250.0  . Blood Glucose Monitoring Suppl (FIFTY50 GLUCOSE METER 2.0) W/DEVICE KIT USE AS DIRECTED  . Blood Glucose Monitoring Suppl (ONE TOUCH ULTRA MINI) W/DEVICE KIT USE AS DIRECTED  . calcium carbonate (OS-CAL - DOSED IN MG OF ELEMENTAL CALCIUM) 1250 (500 CA) MG tablet Take by mouth.  . carboxymethylcellulose (REFRESH PLUS) 0.5 % SOLN Place 1 drop into both eyes 3 (three) times daily as needed (for dry eyes).   . Cholecalciferol (VITAMIN D3) 1000 UNITS CAPS Take by mouth.  . cloNIDine (CATAPRES) 0.1 MG tablet TAKE 1 TABLET BY MOUTH 3 TIMES DAILY.  . cloNIDine (CATAPRES) 0.1 MG tablet TAKE 1 TABLET BY MOUTH 3 TIMES DAILY.  Marland Kitchen Cyanocobalamin 1000 MCG TBCR Take  by mouth.  Marland Kitchen glucose blood (CHOICE DM FORA G20 TEST STRIPS) test strip Use as instructed  . metFORMIN (GLUCOPHAGE) 500 MG tablet Take 1 tablet (500 mg total) by mouth 2 (two) times daily with a meal. (Patient taking differently: Take 500 mg by mouth daily. Patent takes 1/2 tablet by mouth BID)  . metoprolol tartrate (LOPRESSOR) 25 MG tablet TAKE 1 TABLET (25 MG TOTAL) BY MOUTH AS NEEDED. FOR SYSTOLIC BLOOD PRESSURE > 140  . nebivolol (BYSTOLIC) 5 MG tablet Take 1 tablet (5 mg total) by mouth daily.  Glory Rosebush DELICA LANCETS 28A MISC Use as directed. Dx 250.0  . Red Yeast Rice Extract (RED YEAST RICE PO) Take 1,200 mg by mouth daily.    Allergies:   Buprenorphine hcl; Codeine; Mold extract [trichophyton mentagrophyte]; Morphine and related; Oxycodone; Oxycodone hcl; Oxycodone hcl; Sumycin [  tetracycline hcl]; Cardura [doxazosin mesylate]; Chlorphen-phenyleph-asa; Decongest-aid [pseudoephedrine]; Esomeprazole magnesium; Esomeprazole magnesium; Hydralazine; Hydrochlorothiazide; Indomethacin; Procaine hcl; Keflex [cephalexin]; Latex; Neomycin-bacitracin zn-polymyx; Pseudoephedrine hcl er; Sudafed pe cold & cough child  [phenylephrine-dm]; Telithromycin; and Tetracycline   Social History:  The patient  reports that  has never smoked. she has never used smokeless tobacco. She reports that she does not drink alcohol or use drugs.   Family History:  The patient's family history includes Alcohol abuse in her father; Arthritis in her mother; Cancer in her mother; Early death in her father; Heart disease in her mother; Hyperlipidemia in her mother; Hypertension in her mother; Stroke in her mother.  ROS:   Review of Systems  Constitutional: Positive for malaise/fatigue. Negative for chills, diaphoresis, fever and weight loss.  HENT: Negative for congestion.   Eyes: Negative for discharge and redness.  Respiratory: Negative for cough, hemoptysis, sputum production, shortness of breath and wheezing.     Cardiovascular: Negative for chest pain, palpitations, orthopnea, claudication, leg swelling and PND.  Gastrointestinal: Negative for abdominal pain, blood in stool, heartburn, melena, nausea and vomiting.  Genitourinary: Negative for hematuria.  Musculoskeletal: Negative for falls and myalgias.  Skin: Negative for rash.  Neurological: Negative for dizziness, tingling, tremors, sensory change, speech change, focal weakness, loss of consciousness and weakness.  Endo/Heme/Allergies: Does not bruise/bleed easily.  Psychiatric/Behavioral: Negative for substance abuse. The patient is nervous/anxious and has insomnia.   All other systems reviewed and are negative.    PHYSICAL EXAM:  VS:  BP 128/70 (BP Location: Left Arm, Patient Position: Sitting, Cuff Size: Normal)   Pulse 66   Ht 5' 2.5" (1.588 m)   Wt 146 lb (66.2 kg)   BMI 26.28 kg/m  BMI: Body mass index is 26.28 kg/m.  Physical Exam  Constitutional: She is oriented to person, place, and time. She appears well-developed and well-nourished.  HENT:  Head: Normocephalic and atraumatic.  Eyes: Right eye exhibits no discharge. Left eye exhibits no discharge.  Neck: Normal range of motion. No JVD present.  Cardiovascular: Normal rate, regular rhythm, S1 normal and S2 normal. Exam reveals no distant heart sounds, no friction rub, no midsystolic click and no opening snap.  Murmur heard. High-pitched blowing holosystolic murmur is present with a grade of 1/6 at the apex. Pulses:      Posterior tibial pulses are 2+ on the right side, and 2+ on the left side.  Pulmonary/Chest: Effort normal and breath sounds normal. No respiratory distress. She has no decreased breath sounds. She has no wheezes. She has no rales. She exhibits no tenderness.  Abdominal: Soft. She exhibits no distension. There is no tenderness.  Musculoskeletal: She exhibits no edema.  Neurological: She is alert and oriented to person, place, and time.  Skin: Skin is warm and  dry. No cyanosis. Nails show no clubbing.  Psychiatric: She has a normal mood and affect. Her speech is normal and behavior is normal. Judgment and thought content normal.     EKG:  Was ordered and interpreted by me today. Shows NSR, 66 bpm, no acute st/t changes  Recent Labs: 10/25/2016: BUN 15; Creatinine, Ser 0.77; Hemoglobin 14.2; Platelets 237; Potassium 3.6; Sodium 139; TSH 6.756  No results found for requested labs within last 8760 hours.   CrCl cannot be calculated (Patient's most recent lab result is older than the maximum 21 days allowed.).   Wt Readings from Last 3 Encounters:  12/21/16 146 lb (66.2 kg)  11/16/16 146 lb (66.2 kg)  11/09/16 145  lb (65.8 kg)     Other studies reviewed: Additional studies/records reviewed today include: summarized above  ASSESSMENT AND PLAN:  1. Essential HTN: Blood pressure is well controlled today as above. She indicates much improved blood pressure readings since she was last seen as well. She would like to taper off clonidine. She has not started Bystolic yet. She has not needed to take any prn metoprolol. Her current medication regimen is as follows: losartan at breakfast, clonidine at 5 PM, clonidine at bedtime, clonidine at 2 AM. We have agreed for her to stop taking the 5 PM clonidine and to replace this with Bystolic 5 mg daily. The hope is that she will no longer need the evening/overnight doses of clonidine. I did instruct her today that she cannot just stop clonidine and that this medication must be tapered gradually. She understands this. She will continue losartan 100 mg daily and clonidine 0.1 mg bid. Start Bystolic 5 mg daily as above. No further metoprolol. She will need an RN visit in one week to assess her tolerance and response to Bystolic.   2. Multiple medication intolerances: Overall, difficult situation.   3. Severe anxiety: I suspect this is the patient's predominant driving force in her multiple non-cardiac complaints. I  again recommend she follow up with her PCP for referral to psychiatry/behavorial health.    4. RAS: Medically managed.   5. Carotid artery disease: Stable disease noted on ultrasound 07/2016. Follow up with primary cardiologist.   6. Valvular heart disease: Noted to have trivial AI and mild MR on TTE 10/2016. Asymptomatic. Continue to monitor clinically and with periodic echo.   7. Elevated TSH: Followed by endocrinology.   8. HLD: She self discontinued statin. Most recent LDL of 129 from 09/2016. Defer to PCP.   Disposition: F/u with Dr. Rockey Situ, MD in 1 month. .  Current medicines are reviewed at length with the patient today.  The patient did not have any concerns regarding medicines.  Melvern Banker PA-C 12/21/2016 2:38 PM     [address] [place],  [address] [phone]

## 2016-12-21 NOTE — Patient Instructions (Addendum)
Medication Instructions:  Your physician has recommended you make the following change in your medication:  1- Take Bystolic 5 mg by mouth once a day at 5 pm in place of Clonidine. 2- Take Clonidine 1 tablet by mouth two times a day as discussed with Ryan.   Labwork: none  Testing/Procedures: none  Follow-Up: Your physician recommends that you schedule a follow-up appointment in: [place].  Your physician recommends that you schedule a follow-up appointment in: [place].   If you need a refill on your cardiac medications before your next appointment, please call your pharmacy.

## 2016-12-22 NOTE — Addendum Note (Signed)
Addended by: Britt Bottom on: 12/22/2016 08:42 AM   Modules accepted: Orders

## 2016-12-27 ENCOUNTER — Ambulatory Visit (INDEPENDENT_AMBULATORY_CARE_PROVIDER_SITE_OTHER): Payer: PPO

## 2016-12-27 VITALS — BP 130/68 | HR 67 | Resp 16

## 2016-12-27 DIAGNOSIS — I10 Essential (primary) hypertension: Secondary | ICD-10-CM

## 2016-12-27 NOTE — Patient Instructions (Signed)
1.) Reason for visit: BP check  2.) Name of MD requesting visit: Christell Faith, PA-C  3.) H&P:   4.) ROS related to problem: Pt w/history of essential htn, intolerance to several BP medications. Bystolic 5mg  was added at 11/27 in efforts to work towards tapering off clonidine. She has been taking losartan 100mg  in the AM, bystolic 5mg  at 5pm, clonidine 0.1mg  at bedtime and at 2am. She has been following a very strict low sodium diet and rarely eating out. She reports tolerating bystolic with no negative side effects but she does report SBP 160-170s after taking bystolic and before bedtime. Improved with clonidine.  We discussed possibility of increasing bystolic to 10mg  but will await review and recommendations from Christell Faith, PA-C.   5.) Assessment and plan per MD: Today's VS: BP 130/68, HR 67. She took losartan 1/2 hour before BP check. Routed to [employer].

## 2016-12-28 ENCOUNTER — Telehealth: Payer: Self-pay | Admitting: Physician Assistant

## 2016-12-28 ENCOUNTER — Other Ambulatory Visit: Payer: Self-pay

## 2016-12-28 DIAGNOSIS — E119 Type 2 diabetes mellitus without complications: Secondary | ICD-10-CM | POA: Diagnosis not present

## 2016-12-28 MED ORDER — NEBIVOLOL HCL 10 MG PO TABS: 10.0000 mg | ORAL_TABLET | Freq: Every day | ORAL | 3 refills | Status: DC

## 2016-12-28 NOTE — Progress Notes (Signed)
Agree with increasing PM Bystolic to 10 mg daily.

## 2016-12-28 NOTE — Telephone Encounter (Signed)
Rise Mu, PA-C at 12/27/2016 9:45 AM   Status: Sign at close encounter    Agree with increasing PM Bystolic to 10 mg daily.     Reviewed recommendations w/pt who is agreeable w/plan. New prescription sent to [facility].

## 2016-12-30 DIAGNOSIS — G4733 Obstructive sleep apnea (adult) (pediatric): Secondary | ICD-10-CM | POA: Diagnosis not present

## 2016-12-31 ENCOUNTER — Other Ambulatory Visit: Payer: Self-pay | Admitting: *Deleted

## 2016-12-31 MED ORDER — NEBIVOLOL HCL 10 MG PO TABS: 10.0000 mg | ORAL_TABLET | Freq: Every day | ORAL | 3 refills | Status: DC

## 2016-12-31 NOTE — Telephone Encounter (Signed)
Requested Prescriptions   Signed Prescriptions Disp Refills  . nebivolol (BYSTOLIC) 10 MG tablet 30 tablet 3    Sig: Take 1 tablet (10 mg total) by mouth daily. Take at 5pm.    Authorizing Provider: Rise Mu    Ordering User: Britt Bottom

## 2016-12-31 NOTE — Telephone Encounter (Signed)
Pt is asking if can please send prescription to [facility] on [address]

## 2017-01-23 NOTE — Progress Notes (Signed)
Cardiology Office Note  Date:  01/24/2017   ID:  [name], DOB [birth year], MRN [id number]  PCP:  Dion Body, MD   Chief Complaint  Patient presents with  . Other    1 month follow up. Patient denies chest pain and SOB.  Meds reviewed verbally with patient.     HPI:  Yvonne Davidson is a 76 year old woman with a history of  60-79% carotid disease on the left, 40-59% disease on the right,  Hyperlipidemia DM hypertension,  obstructive sleep apnea who is followed at [facility],  chronic pain in her legs,  GERD,  She presents for follow-up of her blood pressure and carotid disease  Overall she is relatively happy with her medication regimen Monitor his blood pressure daily up stairs and down stairs in her house Blood pressure running 140 up to 841 diastolic pressures 66A-63K  Regimen includes Losartan in the Am, bystolic 10 mg at 5 to 6 pm Bedtime takes clonidine Sometimes additional 1/2 pill of the clonidine   off amlodipine secondary to leg swelling    previous evaluation in the emergency room for systolic pressure 160 No recent problems Previously Not sleeping well, nervous Significant stress at home, poor relationship with her husband who she married 3 years ago  Now reports she is sleeping better Took the "smart meter" off her house from [employer] it was causing a problem  Allergy to numerous medications including lisinopril (reaction unclear), HCTZ (pancreatitis), amlodipine (swelling)  She stopped Crestor on her last clinic visit  She has not had her cholesterol tested since that time   troubled by  mold in her attic  Denies  significant shortness of breath or chest pain No regular exercise program hemoglobin A1c 6.6, managed by Dr. Gabriel Carina  sleep apnea, wears nasal pillow with a chin strap, mouthguard  EKG personally reviewed by myself on todays visit Shows sinus bradycardia rate 50 bpm no significant ST or T wave changes  Other  past medical history For obstructive sleep apnea, she wears a mouth piece designed by [facility] dentistry .  she has chronic aching in her legs. She attributes this to sleep apnea and poor blood supply to her legs  She  retired at the age of 61. She was working in [employer]. She was unable to maintain the pace with such poor sleep on a chronic basis  PMH:   has a past medical history of Allergy, Anemia, Anxiety, Arthritis, Bilateral cataracts, Chronic sinusitis, Depression, Diabetes mellitus without complication ([place]), Diverticulosis, Dry eyes, GERD (gastroesophageal reflux disease), Hashimoto's thyroiditis, History of blood transfusion, History of echocardiogram, Hyperlipidemia, Hypertension, Insomnia, Medication intolerance, OSA (obstructive sleep apnea), Osteoporosis, and PONV (postoperative nausea and vomiting).  PSH:    Past Surgical History:  Procedure Laterality Date  . CHOLECYSTECTOMY  12/07/2012   Dr Lucia Gaskins  . CHOLECYSTECTOMY N/A 12/07/2012   Procedure: LAPAROSCOPIC CHOLECYSTECTOMY WITH INTRAOPERATIVE CHOLANGIOGRAM;  Surgeon: Shann Medal, MD;  Location: [place];  Service: General;  Laterality: N/A;  . LIPOSUCTION    . NASAL SINUS SURGERY  2006   x 2  . right arm surgery  1960  . TCS      Current Outpatient Medications  Medication Sig Dispense Refill  . Artificial Tear Ointment (REFRESH LACRI-LUBE) OINT Apply 1 application to eye at bedtime.    . B-D ULTRA-FINE 33 LANCETS MISC Use as directed. Dx 250.0    . Blood Glucose Monitoring Suppl (FIFTY50 GLUCOSE METER 2.0) W/DEVICE KIT USE AS DIRECTED    .  Blood Glucose Monitoring Suppl (ONE TOUCH ULTRA MINI) W/DEVICE KIT USE AS DIRECTED 1 each 0  . calcium carbonate (OS-CAL - DOSED IN MG OF ELEMENTAL CALCIUM) 1250 (500 CA) MG tablet Take by mouth.    . carboxymethylcellulose (REFRESH PLUS) 0.5 % SOLN Place 1 drop into both eyes 3 (three) times daily as needed (for dry eyes).     . Cholecalciferol (VITAMIN D3) 1000 UNITS CAPS Take  by mouth.    . cloNIDine (CATAPRES) 0.1 MG tablet Take 1 tablet (0.1 mg) by mouth two times a day. 180 tablet 3  . Cyanocobalamin 1000 MCG TBCR Take by mouth.    Marland Kitchen glucose blood (CHOICE DM FORA G20 TEST STRIPS) test strip Use as instructed 100 each 12  . metFORMIN (GLUCOPHAGE) 500 MG tablet Take 1 tablet (500 mg total) by mouth 2 (two) times daily with a meal. (Patient taking differently: Take 500 mg by mouth daily. Patent takes 1/2 tablet by mouth BID) 60 tablet 3  . nebivolol (BYSTOLIC) 10 MG tablet Take 1 tablet (10 mg total) by mouth daily. Take at 5pm. 30 tablet 3  . ONETOUCH DELICA LANCETS 33I MISC Use as directed. Dx 250.0 100 each 3  . Red Yeast Rice Extract (RED YEAST RICE PO) Take 1,200 mg by mouth daily.    Marland Kitchen losartan (COZAAR) 100 MG tablet Take 1 tablet (100 mg total) by mouth daily. 30 tablet 6   No current facility-administered medications for this visit.      Allergies:   Buprenorphine hcl; Codeine; Mold extract [trichophyton mentagrophyte]; Morphine and related; Oxycodone; Oxycodone hcl; Oxycodone hcl; Sumycin [tetracycline hcl]; Cardura [doxazosin mesylate]; Chlorphen-phenyleph-asa; Decongest-aid [pseudoephedrine]; Esomeprazole magnesium; Esomeprazole magnesium; Hydralazine; Hydrochlorothiazide; Indomethacin; Procaine hcl; Keflex [cephalexin]; Latex; Neomycin-bacitracin zn-polymyx; Pseudoephedrine hcl er; Sudafed pe cold & cough child  [phenylephrine-dm]; Telithromycin; and Tetracycline   Social History:  The patient  reports that  has never smoked. she has never used smokeless tobacco. She reports that she does not drink alcohol or use drugs.   Family History:   family history includes Alcohol abuse in her father; Arthritis in her mother; Cancer in her mother; Early death in her father; Heart disease in her mother; Hyperlipidemia in her mother; Hypertension in her mother; Stroke in her mother.    Review of Systems: Review of Systems  Constitutional: Negative.   Respiratory:  Negative.   Cardiovascular: Negative.   Gastrointestinal: Negative.   Musculoskeletal: Negative.   Psychiatric/Behavioral: Positive for depression. The patient is nervous/anxious and has insomnia.   All other systems reviewed and are negative.    PHYSICAL EXAM: VS:  BP (!) 142/60 (BP Location: Left Arm, Patient Position: Sitting, Cuff Size: Normal)   Pulse (!) 50   Ht 5' 2.5" (1.588 m)   Wt 147 lb 12 oz (67 kg)   BMI 26.59 kg/m  , BMI Body mass index is 26.59 kg/m.  GEN: Well nourished, well developed, in no acute distress  HEENT: normal , Itchy red eyes, coughing  Neck: no JVD, + 1 b/l carotid bruits, no masses Cardiac: RRR; no murmurs, rubs, or gallops,no ede recommend she stay on Crestor ma  Respiratory:  clear to auscultation bilaterally, normal work of breathing GI: soft, nontender, nondistended, + BS MS: no deformity or atrophy  Skin: warm and dry, no rash Neuro:  Strength and sensation are intact Psych: euthymic mood, full affect    Recent Labs: 10/25/2016: BUN 15; Creatinine, Ser 0.77; Hemoglobin 14.2; Platelets 237; Potassium 3.6; Sodium 139; TSH 6.756  Lipid Panel Lab Results  Component Value Date   CHOL 126 03/03/2015   HDL 47 03/03/2015   LDLCALC 67 03/03/2015   TRIG 62 03/03/2015      Wt Readings from Last 3 Encounters:  01/24/17 147 lb 12 oz (67 kg)  12/21/16 146 lb (66.2 kg)  11/16/16 146 lb (66.2 kg)       ASSESSMENT AND PLAN:   Bilateral carotid artery stenosis - Plan: EKG 12-Lead Recommended she stay on Crestor but it appears she is on red yeast rice Goal LDL less than 70  Hypertension goal BP (blood pressure) < 140/90 - Plan: EKG 12-Lead Continue current medications as detailed above including losartan, clonidine, Bystolic Blood pressure within a reasonable range likely exacerbated at times by stress or anxiety  Hyperlipidemia LDL goal <100 - Plan: EKG 12-Lead Stay on Crestor 5 daily Reports she has difficulty with statins  concerned about side effects.   OSA on CPAP - Plan: EKG 12-Lead Managed by [facility]   Total encounter time more than 25 minutes  Greater than 50% was spent in counseling and coordination of care with the patient   Disposition:   F/U  12 months   Orders Placed This Encounter  Procedures  . EKG 12-Lead     Signed, Esmond Plants, M.D., Ph.D. 01/24/2017  [place], [place]

## 2017-01-24 ENCOUNTER — Ambulatory Visit: Payer: PPO | Admitting: Cardiovascular Disease

## 2017-01-24 VITALS — BP 142/60 | HR 50 | Ht 62.5 in | Wt 147.8 lb

## 2017-01-24 DIAGNOSIS — I272 Pulmonary hypertension, unspecified: Secondary | ICD-10-CM | POA: Diagnosis not present

## 2017-01-24 DIAGNOSIS — I739 Peripheral vascular disease, unspecified: Secondary | ICD-10-CM

## 2017-01-24 DIAGNOSIS — I10 Essential (primary) hypertension: Secondary | ICD-10-CM | POA: Diagnosis not present

## 2017-01-24 DIAGNOSIS — I6523 Occlusion and stenosis of bilateral carotid arteries: Secondary | ICD-10-CM | POA: Diagnosis not present

## 2017-01-24 DIAGNOSIS — E119 Type 2 diabetes mellitus without complications: Secondary | ICD-10-CM

## 2017-01-24 DIAGNOSIS — E785 Hyperlipidemia, unspecified: Secondary | ICD-10-CM

## 2017-01-24 NOTE — Patient Instructions (Addendum)
Medication Instructions:   No medication changes made  Labwork:  No new labs needed  Testing/Procedures:  Carotid u/s in 07/2017   Follow-Up: It was a pleasure seeing you in the office today. Please call us if you have new issues that need to be addressed before your next appt.  [phone]  Your physician wants you to follow-up in: 12 months.  You will receive a reminder letter in the mail two months in advance. If you don't receive a letter, please call our office to schedule the follow-up appointment.  If you need a refill on your cardiac medications before your next appointment, please call your pharmacy.

## 2017-02-17 DIAGNOSIS — H1132 Conjunctival hemorrhage, left eye: Secondary | ICD-10-CM | POA: Diagnosis not present

## 2017-03-29 DIAGNOSIS — Z9989 Dependence on other enabling machines and devices: Secondary | ICD-10-CM | POA: Diagnosis not present

## 2017-03-29 DIAGNOSIS — R001 Bradycardia, unspecified: Secondary | ICD-10-CM | POA: Insufficient documentation

## 2017-03-29 DIAGNOSIS — I10 Essential (primary) hypertension: Secondary | ICD-10-CM | POA: Diagnosis not present

## 2017-03-29 DIAGNOSIS — Z0000 Encounter for general adult medical examination without abnormal findings: Secondary | ICD-10-CM | POA: Diagnosis not present

## 2017-03-29 DIAGNOSIS — E7800 Pure hypercholesterolemia, unspecified: Secondary | ICD-10-CM | POA: Diagnosis not present

## 2017-03-29 DIAGNOSIS — G4733 Obstructive sleep apnea (adult) (pediatric): Secondary | ICD-10-CM | POA: Diagnosis not present

## 2017-04-13 DIAGNOSIS — Z08 Encounter for follow-up examination after completed treatment for malignant neoplasm: Secondary | ICD-10-CM | POA: Diagnosis not present

## 2017-04-13 DIAGNOSIS — Z85828 Personal history of other malignant neoplasm of skin: Secondary | ICD-10-CM | POA: Diagnosis not present

## 2017-04-13 DIAGNOSIS — D1801 Hemangioma of skin and subcutaneous tissue: Secondary | ICD-10-CM | POA: Diagnosis not present

## 2017-04-13 DIAGNOSIS — L821 Other seborrheic keratosis: Secondary | ICD-10-CM | POA: Diagnosis not present

## 2017-04-25 DIAGNOSIS — G4733 Obstructive sleep apnea (adult) (pediatric): Secondary | ICD-10-CM | POA: Diagnosis not present

## 2017-04-29 ENCOUNTER — Other Ambulatory Visit: Payer: Self-pay | Admitting: Physician Assistant

## 2017-04-29 ENCOUNTER — Telehealth: Payer: Self-pay | Admitting: *Deleted

## 2017-04-29 NOTE — Telephone Encounter (Signed)
BYSTOLIC 10 MG tablet 30 tablet 3 04/29/2017    Sig: TAKE 1 TABLET (10 MG TOTAL) BY MOUTH DAILY. TAKE AT 5PM   Sent to pharmacy as: BYSTOLIC 10 MG tablet   E-Prescribing Status: Receipt confirmed by pharmacy (04/29/2017 12:48 PM EDT)   Pharmacy   [id number] - [place], [place]

## 2017-04-29 NOTE — Telephone Encounter (Signed)
Patient called and states that she needs a refill of her Bystolic. [facility] is [facility] [address] . Please advise. Thank you

## 2017-05-02 DIAGNOSIS — G4733 Obstructive sleep apnea (adult) (pediatric): Secondary | ICD-10-CM | POA: Diagnosis not present

## 2017-05-04 DIAGNOSIS — N816 Rectocele: Secondary | ICD-10-CM | POA: Diagnosis not present

## 2017-05-04 DIAGNOSIS — R3915 Urgency of urination: Secondary | ICD-10-CM | POA: Diagnosis not present

## 2017-05-04 DIAGNOSIS — N8111 Cystocele, midline: Secondary | ICD-10-CM | POA: Diagnosis not present

## 2017-05-18 ENCOUNTER — Other Ambulatory Visit: Payer: Self-pay | Admitting: Family Medicine

## 2017-05-18 DIAGNOSIS — Z1231 Encounter for screening mammogram for malignant neoplasm of breast: Secondary | ICD-10-CM

## 2017-05-22 ENCOUNTER — Other Ambulatory Visit: Payer: Self-pay

## 2017-05-22 ENCOUNTER — Emergency Department
Admission: EM | Admit: 2017-05-22 | Discharge: 2017-05-22 | Disposition: A | Discharge status: Home / Self Care | Payer: PPO | Attending: Emergency Medicine | Admitting: Emergency Medicine

## 2017-05-22 ENCOUNTER — Encounter: Payer: Self-pay | Admitting: Emergency Medicine

## 2017-05-22 DIAGNOSIS — I10 Essential (primary) hypertension: Secondary | ICD-10-CM | POA: Insufficient documentation

## 2017-05-22 DIAGNOSIS — E119 Type 2 diabetes mellitus without complications: Secondary | ICD-10-CM | POA: Insufficient documentation

## 2017-05-22 DIAGNOSIS — R222 Localized swelling, mass and lump, trunk: Secondary | ICD-10-CM | POA: Diagnosis not present

## 2017-05-22 DIAGNOSIS — Z7984 Long term (current) use of oral hypoglycemic drugs: Secondary | ICD-10-CM | POA: Insufficient documentation

## 2017-05-22 DIAGNOSIS — Z91040 Latex allergy status: Secondary | ICD-10-CM | POA: Insufficient documentation

## 2017-05-22 DIAGNOSIS — J45909 Unspecified asthma, uncomplicated: Secondary | ICD-10-CM | POA: Diagnosis not present

## 2017-05-22 DIAGNOSIS — W57XXXA Bitten or stung by nonvenomous insect and other nonvenomous arthropods, initial encounter: Secondary | ICD-10-CM | POA: Insufficient documentation

## 2017-05-22 DIAGNOSIS — Z79899 Other long term (current) drug therapy: Secondary | ICD-10-CM | POA: Insufficient documentation

## 2017-05-22 DIAGNOSIS — S30860A Insect bite (nonvenomous) of lower back and pelvis, initial encounter: Secondary | ICD-10-CM | POA: Diagnosis not present

## 2017-05-22 MED ORDER — HYDROCORTISONE 0.5 % EX CREA: 1.0000 "application " | TOPICAL_CREAM | Freq: Two times a day (BID) | CUTANEOUS | 0 refills | Status: DC

## 2017-05-22 MED ORDER — AMOXICILLIN 500 MG PO CAPS: 500.0000 mg | ORAL_CAPSULE | Freq: Three times a day (TID) | ORAL | 0 refills | Status: DC

## 2017-05-22 NOTE — ED Triage Notes (Signed)
Pt to ED via POV c/o tick bite on her upper back. Pt states that she got the tick off. Pt in NAD at this time.
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
## 2017-05-22 NOTE — ED Notes (Addendum)
Tick bite to L middle back. Red around bite site. Pt states she put clorox on cuetip and placed on bite and on tick. Pt has tick in a baggie. States she did research to find out what tick it is. States it's a lone star tick. Wants to have it looked at under a microscope. Alert, oriented, ambulatory. No distress noted. Bite is red and somewhat swollen.

## 2017-05-25 ENCOUNTER — Other Ambulatory Visit: Payer: Self-pay | Admitting: Pharmacist

## 2017-05-25 NOTE — Patient Outreach (Signed)
[place] [facility]) Care Management  05/25/2017  Yvonne Davidson 1940/04/12 [id number]  77 year old female referred to [place] [facility] by [facility] for medication assistance with Bystolic and an eye drop medication. PMHx includes, but not limited to, GERD, IBS, osteoporosis, Hashimoto's disease, anxiety, depression, asthma, diabetes mellitus type 2, HTN, HLD and PAD.   Unsuccessful call placed to [name] today.  I left a HIPAA compliant voicemail on patient's home phone.   Plan: I will send patient a letter describing [facility] services and follow-up with her again within 3-4 business days.   Ralene Bathe, PharmD, [place] [phone]

## 2017-05-31 ENCOUNTER — Other Ambulatory Visit: Payer: Self-pay | Admitting: Pharmacist

## 2017-05-31 ENCOUNTER — Ambulatory Visit: Payer: Self-pay | Admitting: Pharmacist

## 2017-05-31 NOTE — Patient Outreach (Signed)
[facility] [facility]) Care Management  05/31/2017  Yvonne Davidson [birth year] [id number]   Unsuccessful call attempt #2 to [name] today.  I left a HIPAA compliant voicemail on patient's home phone.    Plan: I will call patient again in the next 3-4 business days regarding medication assistance.   Ralene Bathe, PharmD, [place] [phone]

## 2017-06-01 DIAGNOSIS — W57XXXD Bitten or stung by nonvenomous insect and other nonvenomous arthropods, subsequent encounter: Secondary | ICD-10-CM | POA: Diagnosis not present

## 2017-06-01 DIAGNOSIS — S30860A Insect bite (nonvenomous) of lower back and pelvis, initial encounter: Secondary | ICD-10-CM | POA: Diagnosis not present

## 2017-06-06 ENCOUNTER — Other Ambulatory Visit: Payer: Self-pay | Admitting: Pharmacist

## 2017-06-06 ENCOUNTER — Ambulatory Visit: Payer: Self-pay | Admitting: Pharmacist

## 2017-06-06 NOTE — Patient Outreach (Signed)
Yvonne Davidson [facility]) Care Management  06/06/2017  Yvonne Davidson Aug 31, 1940 [id number]  3rd unsuccessful call attempt to [name].  I left a HIPAA compliant voicemail on the home / mobile phone.   Plan: I will close patient case on Thursday, May 16th if I have not heard back from patient at this time.   Ralene Bathe, PharmD, [place] [phone]

## 2017-06-07 ENCOUNTER — Ambulatory Visit
Admission: RE | Admit: 2017-06-07 | Discharge: 2017-06-07 | Disposition: A | Discharge status: Home / Self Care | Payer: PPO | Source: Ambulatory Visit | Attending: Family Medicine | Admitting: Family Medicine

## 2017-06-07 DIAGNOSIS — Z1231 Encounter for screening mammogram for malignant neoplasm of breast: Secondary | ICD-10-CM | POA: Diagnosis not present

## 2017-06-08 DIAGNOSIS — T148XXA Other injury of unspecified body region, initial encounter: Secondary | ICD-10-CM | POA: Diagnosis not present

## 2017-06-08 DIAGNOSIS — N8111 Cystocele, midline: Secondary | ICD-10-CM | POA: Diagnosis not present

## 2017-06-09 ENCOUNTER — Other Ambulatory Visit: Payer: Self-pay | Admitting: Pharmacist

## 2017-06-09 ENCOUNTER — Ambulatory Visit: Payer: Self-pay | Admitting: Pharmacist

## 2017-06-09 NOTE — Patient Outreach (Signed)
[place] [facility]) Care Management  06/09/2017  Yvonne Davidson [birth year] [id number]  [facility] pharmacy case is being closed due to the following reasons:  We have been unable to establish and/or maintain contact with the patient.   Ralene Bathe, PharmD, [place] [phone]

## 2017-06-13 DIAGNOSIS — N8110 Cystocele, unspecified: Secondary | ICD-10-CM | POA: Diagnosis not present

## 2017-06-24 DIAGNOSIS — W57XXXD Bitten or stung by nonvenomous insect and other nonvenomous arthropods, subsequent encounter: Secondary | ICD-10-CM | POA: Diagnosis not present

## 2017-06-24 DIAGNOSIS — L910 Hypertrophic scar: Secondary | ICD-10-CM | POA: Diagnosis not present

## 2017-06-27 ENCOUNTER — Other Ambulatory Visit: Payer: Self-pay | Admitting: Cardiovascular Disease

## 2017-07-01 DIAGNOSIS — E119 Type 2 diabetes mellitus without complications: Secondary | ICD-10-CM | POA: Diagnosis not present

## 2017-07-06 DIAGNOSIS — E119 Type 2 diabetes mellitus without complications: Secondary | ICD-10-CM | POA: Diagnosis not present

## 2017-07-06 DIAGNOSIS — E663 Overweight: Secondary | ICD-10-CM | POA: Diagnosis not present

## 2017-07-06 DIAGNOSIS — R7989 Other specified abnormal findings of blood chemistry: Secondary | ICD-10-CM | POA: Diagnosis not present

## 2017-07-06 DIAGNOSIS — I10 Essential (primary) hypertension: Secondary | ICD-10-CM | POA: Diagnosis not present

## 2017-08-10 DIAGNOSIS — H26492 Other secondary cataract, left eye: Secondary | ICD-10-CM | POA: Diagnosis not present

## 2017-08-17 ENCOUNTER — Ambulatory Visit: Payer: PPO | Admitting: Dietician

## 2017-08-18 ENCOUNTER — Encounter: Payer: PPO | Attending: Internal Medicine | Admitting: Dietician

## 2017-08-18 VITALS — Ht 62.0 in | Wt 148.4 lb

## 2017-08-18 DIAGNOSIS — E119 Type 2 diabetes mellitus without complications: Secondary | ICD-10-CM

## 2017-08-18 DIAGNOSIS — Z713 Dietary counseling and surveillance: Secondary | ICD-10-CM | POA: Insufficient documentation

## 2017-08-18 DIAGNOSIS — Z6827 Body mass index (BMI) 27.0-27.9, adult: Secondary | ICD-10-CM | POA: Insufficient documentation

## 2017-08-18 NOTE — Patient Instructions (Signed)
   Include a protein source with each meal.   Eat something every 4-5 hours during the day, to prevent excessive hunger at mealtimes.   Keep a record/ diary of food intake and compare to meal plan to pinpoint any other changes.   Allow for occasional treats. Remember that nutrients including sodium, fat, etc. will average out over time.

## 2017-08-18 NOTE — Progress Notes (Signed)
Medical Nutrition Therapy: Visit start time: 1600  end time: 1730  Assessment:  Diagnosis: Type 2 Diabetes, weight gain Past medical history: HTN, HLD, sleep apnea Psychosocial issues/ stress concerns: patient reports high stress level, feels she is not dealing well with stress.  Preferred learning method:  . Auditory . Visual . Hands-on  Current weight: 148.4lbs Height: 5'2" Medications, supplements: reconciled list in medical record  Progress and evaluation: Patient reports struggling to lose weight and wants help with a structured meal plan. She does not eat much meat, mostly vegetarian eating pattern; avoids gluten due to itching reaction when eating large amounts. She reports high stress due to health concerns, caring for ex-husband in declining health, friend who is critically ill.    Physical activity: walking 40-50 minutes 7 days a week  Dietary Intake:  Usual eating pattern includes 2 meals and 1-2 snacks per day. Dining out frequency: 2-3 meals per week.  Breakfast: often none Snack: none Lunch: oatmeal; low-carb, gluten free, high fiber homemade bread with light cream cheese with fresh herbs, today biscuit with egg, tomato, and bacon 2 strips, 1-2 tsp mayo Snack: fruit or occ treat of corn chips with salsa; guacamole; popcorn air popped with NoSalt and olive oil Supper: 4-6pm Snack: none or same as pm  Beverages: tea and coffee with Stevia; sugar free soda with Stevia  Nutrition Care Education: Topics covered: weight control, diabetes Basic nutrition: basic food groups, appropriate nutrient balance, appropriate meal and snack schedule, general nutrition guidelines    Weight control: calculated energy needs for weight loss at about 1300kcal daily, provided written meal plan and menus. Discussed role of protein in promoting fulness, as well as benefits of eating at regular intervals; discussed role of stress on hunger and appetite and weight. Discussed benefits of tracking  food intake. Diabetes: appropriate meal and snack schedule, appropriate carb intake and balance  Nutritional Diagnosis:  [place]-2.2 Altered nutrition-related laboratory As related to diabetes.  As evidenced by patient with HbA1C of 6.8%. [place]-3.4 Unintentional weight gain As related to stress, excess calories.  As evidenced by patient with BMI of 27 and patient report of dietary intake.  Intervention: Instruction as noted above.   Set goals with input from patient.    Encouraged ongoing effort to manage stress and effects on health.    Commended patient for healthy food choices and regular exercise.    No follow-up at this time; patient will schedule later if needed.   Education Materials given:  . Plate Planner . Daily Meal Plan (Lilly) with 1300kcal meal plan . Sample meal pattern/ menus . Vegetarian Proteins . Goals/ instructions   Learner/ who was taught:  . Patient    Level of understanding: Marland Kitchen Verbalizes/ demonstrates competency   Demonstrated degree of understanding via:   Teach back Learning barriers: . None  Willingness to learn/ readiness for change: . Eager, change in progress  Monitoring and Evaluation:  Dietary intake, exercise, BG control, and body weight      follow up: prn

## 2017-08-26 ENCOUNTER — Other Ambulatory Visit: Payer: Self-pay

## 2017-08-26 NOTE — Telephone Encounter (Signed)
Faxed prior authorization form with office notes for Bystolic 10 mg to [facility].  Waiting for approval.

## 2017-08-29 NOTE — Telephone Encounter (Signed)
LMOM for patient to contact the office regarding a PA denial for Bystolic 10 mg.

## 2017-08-30 DIAGNOSIS — G4733 Obstructive sleep apnea (adult) (pediatric): Secondary | ICD-10-CM | POA: Diagnosis not present

## 2017-08-30 NOTE — Telephone Encounter (Signed)
Patient returning our call ° °Please call back ° °

## 2017-08-31 NOTE — Telephone Encounter (Signed)
Left message on machine to have patient call back regarding the denial for the tier exception for Bystolic.

## 2017-08-31 NOTE — Telephone Encounter (Signed)
Spoke with Ms. Yvonne Davidson regarding the denial for Bystolic tier exception.  She is aware that the Bystolic is already at it's lowest tier and she will just continue paying the $30.00 every month.

## 2017-09-22 DIAGNOSIS — H26492 Other secondary cataract, left eye: Secondary | ICD-10-CM | POA: Diagnosis not present

## 2017-09-22 DIAGNOSIS — E7800 Pure hypercholesterolemia, unspecified: Secondary | ICD-10-CM | POA: Diagnosis not present

## 2017-09-22 DIAGNOSIS — I10 Essential (primary) hypertension: Secondary | ICD-10-CM | POA: Diagnosis not present

## 2017-09-22 DIAGNOSIS — H26491 Other secondary cataract, right eye: Secondary | ICD-10-CM | POA: Diagnosis not present

## 2017-09-22 DIAGNOSIS — E119 Type 2 diabetes mellitus without complications: Secondary | ICD-10-CM | POA: Diagnosis not present

## 2017-09-29 DIAGNOSIS — I10 Essential (primary) hypertension: Secondary | ICD-10-CM | POA: Diagnosis not present

## 2017-09-29 DIAGNOSIS — Z0000 Encounter for general adult medical examination without abnormal findings: Secondary | ICD-10-CM | POA: Diagnosis not present

## 2017-09-29 DIAGNOSIS — E119 Type 2 diabetes mellitus without complications: Secondary | ICD-10-CM | POA: Diagnosis not present

## 2017-09-29 DIAGNOSIS — E7800 Pure hypercholesterolemia, unspecified: Secondary | ICD-10-CM | POA: Diagnosis not present

## 2017-10-25 ENCOUNTER — Telehealth: Payer: Self-pay | Admitting: Cardiovascular Disease

## 2017-10-25 NOTE — Telephone Encounter (Signed)
Pt c/o BP issue: STAT if pt c/o blurred vision, one-sided weakness or slurred speech  1. What are your last 5 BP readings? Today -191/68 9/30-168/68   2. Are you having any other symptoms (ex. Dizziness, headache, blurred vision, passed out)? Feels "horrible", not feeling well  3. What is your BP issue? Elevated, thinks it is her medication

## 2017-10-25 NOTE — Telephone Encounter (Signed)
Left voicemail message to call back  

## 2017-10-26 NOTE — Telephone Encounter (Signed)
Patient returning call.

## 2017-10-26 NOTE — Telephone Encounter (Signed)
I left a message for the patient to call back 

## 2017-10-26 NOTE — Telephone Encounter (Signed)
Patient returning our call ° °Please call back ° °

## 2017-10-26 NOTE — Telephone Encounter (Signed)
Returned the call to the patient. She stated that her blood pressure has been elevated for the past few days. She stated that it has always been sporadic. She denies any symptoms: chest pain, headache or dizziness.  She currently takes: Bystolic 10 mg at 5pm  Clonidine 0.1 mg once daily at 10pm (not bid as prescribed) Losartan: 100 mg daily in the morning.  She stated that her blood pressure this morning was 179/70 10/1 179/68 9/30 191/70 9/29 167/68  Her heart rates have been staying in the 50's. These blood pressures were taken prior to her taking the Losartan in the morning.  She has been advised to check her blood pressures 1-2 hours after she has taken her medication and to keep a log of this. She will call back tomorrow or Friday with this information. She has been advised to call sooner if she becomes symptomatic.

## 2017-11-10 NOTE — Telephone Encounter (Signed)
Spoke with patient and she states that her blood pressure is still running high and mostly at night. She doesn't want to change anything at this time. She states her blood pressures have been as high as 192/40-57 and yesterday 172/68 and at night it is worse. She reports systolic numbers of 358-251 during the day and diastolic 89-84'K. She has been using doterra oils internal to help with all kinds of other remedies, along with magnesium and beat powder with oxide. She is not taking the clonidine 0.1 mg twice a day. She states that she takes it only once daily and she does not want to take it twice a day and states that it caused a mess. She wanted to know about trying something like lisinopril to see if that will help. Advised that I would send this to the provider for his review and would be in touch if he would like to make any changes. She verbalized understanding of our conversation, agreement with plan, and had no further questions at this time.

## 2017-11-12 NOTE — Telephone Encounter (Signed)
dont mix losartan and lisinopril, and we have her with intolerance to lisinopril She will have labile pressure when taking clonidine once a day, rebound HTN She could try hydralazine 25 ng as needed (typically lasts 6 hours), written TID PRN

## 2017-11-14 MED ORDER — HYDRALAZINE HCL 25 MG PO TABS: 25.0000 mg | ORAL_TABLET | Freq: Three times a day (TID) | ORAL | 3 refills | Status: DC | PRN

## 2017-11-14 NOTE — Telephone Encounter (Signed)
Patient returning call.

## 2017-11-14 NOTE — Telephone Encounter (Signed)
Spoke with patient and reviewed Dr. Donivan Scull recommendations. Sent in prescription for hydralazine and discussed instructions that she can take it up to 3 times a day for high blood pressures. She was very apologetic about not tolerating medications. Expressed to her that is what we are here for and that we will keep trying to help. She was very appreciative for the call back. Instructed her to please continue monitoring her blood pressures and to please let us know if this does not help. She verbalized understanding with no further questions at this time. Prescription sent into her pharmacy of choice and she had no further concerns at this time.

## 2017-11-14 NOTE — Telephone Encounter (Signed)
Left voicemail message to call back  

## 2017-11-19 ENCOUNTER — Other Ambulatory Visit: Payer: Self-pay | Admitting: Cardiovascular Disease

## 2017-11-21 NOTE — Telephone Encounter (Signed)
Patient called back to say she no longer needed a call back She spoke with Pharmacy about her concern

## 2017-11-21 NOTE — Telephone Encounter (Signed)
Patient calling  Hasn't started taking medication - patient is allergic to diuretics and medication is a diuretic Patient would like to speak with Pam, aware she is off 10/28 and 10/29, will wait to speak with her Please call to discuss

## 2017-12-02 DIAGNOSIS — G4733 Obstructive sleep apnea (adult) (pediatric): Secondary | ICD-10-CM | POA: Diagnosis not present

## 2017-12-09 DIAGNOSIS — M2241 Chondromalacia patellae, right knee: Secondary | ICD-10-CM | POA: Diagnosis not present

## 2017-12-09 DIAGNOSIS — M2242 Chondromalacia patellae, left knee: Secondary | ICD-10-CM | POA: Diagnosis not present

## 2017-12-13 DIAGNOSIS — M25551 Pain in right hip: Secondary | ICD-10-CM | POA: Diagnosis not present

## 2017-12-13 DIAGNOSIS — M1611 Unilateral primary osteoarthritis, right hip: Secondary | ICD-10-CM | POA: Diagnosis not present

## 2017-12-13 DIAGNOSIS — G8929 Other chronic pain: Secondary | ICD-10-CM | POA: Diagnosis not present

## 2017-12-15 ENCOUNTER — Other Ambulatory Visit: Payer: Self-pay | Admitting: Cardiovascular Disease

## 2017-12-19 ENCOUNTER — Other Ambulatory Visit: Payer: Self-pay | Admitting: Cardiovascular Disease

## 2017-12-28 DIAGNOSIS — M8588 Other specified disorders of bone density and structure, other site: Secondary | ICD-10-CM | POA: Diagnosis not present

## 2017-12-30 DIAGNOSIS — E119 Type 2 diabetes mellitus without complications: Secondary | ICD-10-CM | POA: Diagnosis not present

## 2018-01-04 DIAGNOSIS — M25551 Pain in right hip: Secondary | ICD-10-CM | POA: Diagnosis not present

## 2018-01-04 DIAGNOSIS — M25561 Pain in right knee: Secondary | ICD-10-CM | POA: Diagnosis not present

## 2018-01-06 DIAGNOSIS — E038 Other specified hypothyroidism: Secondary | ICD-10-CM | POA: Insufficient documentation

## 2018-01-06 DIAGNOSIS — I10 Essential (primary) hypertension: Secondary | ICD-10-CM | POA: Diagnosis not present

## 2018-01-06 DIAGNOSIS — E039 Hypothyroidism, unspecified: Secondary | ICD-10-CM | POA: Diagnosis not present

## 2018-01-06 DIAGNOSIS — E1159 Type 2 diabetes mellitus with other circulatory complications: Secondary | ICD-10-CM | POA: Diagnosis not present

## 2018-01-06 DIAGNOSIS — E119 Type 2 diabetes mellitus without complications: Secondary | ICD-10-CM | POA: Diagnosis not present

## 2018-01-12 ENCOUNTER — Other Ambulatory Visit: Payer: Self-pay | Admitting: Cardiovascular Disease

## 2018-01-13 DIAGNOSIS — I10 Essential (primary) hypertension: Secondary | ICD-10-CM | POA: Diagnosis not present

## 2018-01-13 DIAGNOSIS — M25551 Pain in right hip: Secondary | ICD-10-CM | POA: Diagnosis not present

## 2018-01-13 DIAGNOSIS — M25561 Pain in right knee: Secondary | ICD-10-CM | POA: Diagnosis not present

## 2018-01-20 DIAGNOSIS — M25561 Pain in right knee: Secondary | ICD-10-CM | POA: Diagnosis not present

## 2018-01-20 DIAGNOSIS — M25551 Pain in right hip: Secondary | ICD-10-CM | POA: Diagnosis not present

## 2018-01-25 ENCOUNTER — Telehealth: Payer: Self-pay | Admitting: Cardiovascular Disease

## 2018-01-27 DIAGNOSIS — M25561 Pain in right knee: Secondary | ICD-10-CM | POA: Diagnosis not present

## 2018-01-27 DIAGNOSIS — M25551 Pain in right hip: Secondary | ICD-10-CM | POA: Diagnosis not present

## 2018-01-30 DIAGNOSIS — I272 Pulmonary hypertension, unspecified: Secondary | ICD-10-CM | POA: Diagnosis not present

## 2018-01-30 DIAGNOSIS — Z9989 Dependence on other enabling machines and devices: Secondary | ICD-10-CM | POA: Diagnosis not present

## 2018-01-30 DIAGNOSIS — G4733 Obstructive sleep apnea (adult) (pediatric): Secondary | ICD-10-CM | POA: Diagnosis not present

## 2018-01-31 NOTE — Telephone Encounter (Signed)
°*  STAT* If patient is at the pharmacy, call can be transferred to refill team.   1. Which medications need to be refilled? (please list name of each medication and dose if known) losartan    2. Which pharmacy/location (including street and city if local pharmacy) [facility] [place]  3. Do they need a 30 day or 90 day supply? 90 day

## 2018-02-01 NOTE — Progress Notes (Signed)
Cardiology Office Note  Date:  02/02/2018   ID:  Yvonne Davidson, DOB November 29, 1940, MRN [id number]  PCP:  Yvonne Body, MD   Chief Complaint  Patient presents with  . other    12 mo follow up.SOB with exertion is improving. Medications reviewed verbally.     HPI:  Yvonne Davidson is a 78 year-old woman with a history of  60-79% carotid disease on the left, 40-59% disease on the right,  Hyperlipidemia DM hypertension,  obstructive sleep apnea who is followed at [facility],  chronic pain in her legs,  GERD,  She presents for follow-up of her blood pressure and carotid disease  In follow-up today she reports that she continues to have difficulty with tolerating medications She is able to tolerate medications listed below This is been her regiment for quite some time   Losartan in the Am, bystolic 10 mg at dinner Bedtime takes clonidine Sometimes additional 1/2 pill of the clonidine   off amlodipine secondary to leg swelling  Unable to tolerate any diuretics per the patient including HCTZ, Lasix, Aldactone She is cautious about taking extra doses of Bystolic or clonidine concerned about bradycardia but has never had symptoms  Denies any other chest pain or shortness of breath symptoms History of anxiety Previously Not sleeping well, nervous Previously reported significant stress at home, poor relationship with her husband who she married 3 years ago  Now reports she is sleeping better Took the "smart meter" off her house from Yvonne Davidson it was causing a problem with her sleep  She stopped Crestor on her own Reports having mold in her attic in the past, felt it was affecting her breathing Had testing done on her house  She is trying to follow a regular exercise program  sleep apnea, wears nasal pillow with a chin strap, mouthguard  EKG personally reviewed by myself on todays visit Shows sinus bradycardia rate 56 bpm no significant ST or T wave  changes  Other past medical history For obstructive sleep apnea, she wears a mouth piece designed by [facility] .  she has chronic aching in her legs. She attributes this to sleep apnea and poor blood supply to her legs  She  retired at the age of 3. She was working in [employer]. She was unable to maintain the pace with such poor sleep on a chronic basis  PMH:   has a past medical history of Allergy, Anemia, Anxiety, Arthritis, Bilateral cataracts, Chronic sinusitis, Depression, Diabetes mellitus without complication ([place]), Diverticulosis, Dry eyes, GERD (gastroesophageal reflux disease), Hashimoto's thyroiditis, History of blood transfusion, History of echocardiogram, Hyperlipidemia, Hypertension, Insomnia, Medication intolerance, OSA (obstructive sleep apnea), Osteoporosis, and PONV (postoperative nausea and vomiting).  PSH:    Past Surgical History:  Procedure Laterality Date  . CHOLECYSTECTOMY  12/07/2012   Dr Lucia Gaskins  . CHOLECYSTECTOMY N/A 12/07/2012   Procedure: LAPAROSCOPIC CHOLECYSTECTOMY WITH INTRAOPERATIVE CHOLANGIOGRAM;  Surgeon: Shann Medal, MD;  Location: [facility];  Service: General;  Laterality: N/A;  . LIPOSUCTION    . NASAL SINUS SURGERY  2006   x 2  . right arm surgery  1960  . TCS      Current Outpatient Medications  Medication Sig Dispense Refill  . amoxicillin (AMOXIL) 500 MG capsule Take 1 capsule (500 mg total) by mouth 3 (three) times daily. 30 capsule 0  . Artificial Tear Ointment (REFRESH LACRI-LUBE) OINT Apply 1 application to eye at bedtime.    . Ascorbic Acid (VITAMIN C  PO) Take by mouth.    . B-D ULTRA-FINE 33 LANCETS MISC Use as directed. Dx 250.0    . Barberry-Oreg Grape-Goldenseal (BERBERINE COMPLEX PO) Take by mouth.    . Blood Glucose Monitoring Suppl (ONE TOUCH ULTRA MINI) W/DEVICE KIT USE AS DIRECTED 1 each 0  . BYSTOLIC 10 MG tablet TAKE 1 TABLET (10 MG TOTAL) BY MOUTH DAILY. TAKE AT 5PM 90 tablet 1  . calcium carbonate (OS-CAL -  DOSED IN MG OF ELEMENTAL CALCIUM) 1250 (500 CA) MG tablet Take by mouth.    . carboxymethylcellulose (REFRESH PLUS) 0.5 % SOLN Place 1 drop into both eyes 3 (three) times daily as needed (for dry eyes).     . Cholecalciferol (VITAMIN D3) 1000 UNITS CAPS Take by mouth.    . cloNIDine (CATAPRES) 0.1 MG tablet Take 1 tablet (0.1 mg) by mouth two times a day. (Patient taking differently: 0.1 mg daily. Take 1 tablet (0.1 mg) by mouth two times a day.) 180 tablet 3  . Cyanocobalamin 1000 MCG TBCR Take by mouth.    . ESOMEPRAZOLE STRONTIUM PO Take by mouth.    . losartan (COZAAR) 100 MG tablet TAKE 1 TABLET BY MOUTH EVERY DAY 90 tablet 0  . metFORMIN (GLUCOPHAGE) 500 MG tablet Take 1 tablet (500 mg total) by mouth 2 (two) times daily with a meal. (Patient taking differently: Take 500 mg by mouth daily. Patent takes 1/2 tablet by mouth BID) 60 tablet 3  . METHYLSULFONYLMETHANE PO Take by mouth.    . Multiple Vitamins-Minerals (SENIOR MULTIVITAMIN PLUS PO) Take by mouth.    . Omega-3 Fatty Acids (FISH OIL) 1000 MG CAPS Take by mouth.    Glory Rosebush DELICA LANCETS 19J MISC Use as directed. Dx 250.0 100 each 3  . PROBIOTIC PRODUCT PO Take by mouth.    . Red Yeast Rice Extract (RED YEAST RICE PO) Take 1,200 mg by mouth daily.    . TURMERIC PO Take by mouth.    . Ubiquinol (QUNOL COQ10/UBIQUINOL/MEGA) 100 MG CAPS Take by mouth.     No current facility-administered medications for this visit.      Allergies:   Buprenorphine hcl; Chlorphen-diphenhyd-pe-apap; Codeine; Mold extract [trichophyton mentagrophyte]; Morphine and related; Oxycodone; Oxycodone hcl; Oxycodone hcl; Sumycin [tetracycline hcl]; Cardura [doxazosin mesylate]; Chlorphen-phenyleph-asa; Decongest-aid [pseudoephedrine]; Esomeprazole magnesium; Esomeprazole magnesium; Hydralazine; Hydrochlorothiazide; Indomethacin; Procaine hcl; Keflex [cephalexin]; Latex; Neomycin-bacitracin zn-polymyx; Pseudoephedrine hcl er; Sudafed pe cold & cough child   [phenylephrine-dm]; Telithromycin; and Tetracycline   Social History:  The patient  reports that she has never smoked. She has never used smokeless tobacco. She reports that she does not drink alcohol or use drugs.   Family History:   family history includes Alcohol abuse in her father; Arthritis in her mother; Cancer in her mother; Early death in her father; Heart disease in her mother; Hyperlipidemia in her mother; Hypertension in her mother; Stroke in her mother.    Review of Systems: Review of Systems  Constitutional: Negative.   Respiratory: Negative.   Cardiovascular: Negative.   Gastrointestinal: Negative.   Musculoskeletal: Negative.   Psychiatric/Behavioral: Positive for depression. The patient is nervous/anxious and has insomnia.   All other systems reviewed and are negative.   PHYSICAL EXAM: VS:  BP 138/66 (BP Location: Left Arm, Patient Position: Sitting, Cuff Size: Normal)   Pulse (!) 56   Ht '5\' 1"'  (1.549 m)   Wt 150 lb (68 kg)   BMI 28.34 kg/m  , BMI Davidson mass index is 28.34 kg/m.  GEN: Well nourished, well developed, in no acute distress  HEENT: normal , Itchy red eyes, coughing  Neck: no JVD, + 1 b/l carotid bruits, no masses Cardiac: RRR; no murmurs, rubs, or gallops,no ede recommend she stay on Crestor ma  Respiratory:  clear to auscultation bilaterally, normal work of breathing GI: soft, nontender, nondistended, + BS MS: no deformity or atrophy  Skin: warm and dry, no rash Neuro:  Strength and sensation are intact Psych: euthymic mood, full affect   Recent Labs: No results found for requested labs within last 8760 hours.    Lipid Panel Lab Results  Component Value Date   CHOL 126 03/03/2015   HDL 47 03/03/2015   LDLCALC 67 03/03/2015   TRIG 62 03/03/2015      Wt Readings from Last 3 Encounters:  02/02/18 150 lb (68 kg)  08/18/17 148 lb 6.4 oz (67.3 kg)  05/22/17 147 lb (66.7 kg)       ASSESSMENT AND PLAN:   Bilateral carotid artery  stenosis - Plan: EKG 12-Lead  on red yeast rice Recommended she have periodic lab work with primary care  Hypertension goal BP (blood pressure) < 140/90 - Suggested she continue losartan, clonidine, Bystolic If blood pressure runs high could take extra dose of clonidine or Bystolic Labile numbers exacerbated by anxiety Significant medication intolerances  Hyperlipidemia LDL goal <100 - Plan: EKG 12-Lead Previously took herself off Crestor, on red yeast rice  OSA on CPAP - Plan: EKG 12-Lead Managed by [facility] Chronic poor sleep hygiene   Total encounter time more than 25 minutes  Greater than 50% was spent in counseling and coordination of care with the patient   Disposition:   F/U as needed   Orders Placed This Encounter  Procedures  . EKG 12-Lead     Signed, Esmond Plants, M.D., Ph.D. 02/02/2018  [facility] [place], [place] [phone]

## 2018-02-01 NOTE — Telephone Encounter (Signed)
Pt requesting 90 day refill for Losartan pt hasn't been seen since 12/2016. Pt has upcoming appointment with Dr. Rockey Situ 02/02/2018.  Please advise if ok to refill for 90 day and not until future appointment.

## 2018-02-01 NOTE — Telephone Encounter (Signed)
Refill ok to send in with message that Must keep appointment for further refills.

## 2018-02-02 ENCOUNTER — Ambulatory Visit: Payer: PPO | Admitting: Cardiovascular Disease

## 2018-02-02 ENCOUNTER — Encounter: Payer: Self-pay | Admitting: Cardiovascular Disease

## 2018-02-02 VITALS — BP 138/66 | HR 56 | Ht 61.0 in | Wt 150.0 lb

## 2018-02-02 DIAGNOSIS — I6523 Occlusion and stenosis of bilateral carotid arteries: Secondary | ICD-10-CM

## 2018-02-02 DIAGNOSIS — I272 Pulmonary hypertension, unspecified: Secondary | ICD-10-CM

## 2018-02-02 DIAGNOSIS — E119 Type 2 diabetes mellitus without complications: Secondary | ICD-10-CM

## 2018-02-02 DIAGNOSIS — E785 Hyperlipidemia, unspecified: Secondary | ICD-10-CM | POA: Diagnosis not present

## 2018-02-02 DIAGNOSIS — I10 Essential (primary) hypertension: Secondary | ICD-10-CM | POA: Diagnosis not present

## 2018-02-02 DIAGNOSIS — F419 Anxiety disorder, unspecified: Secondary | ICD-10-CM

## 2018-02-02 DIAGNOSIS — I701 Atherosclerosis of renal artery: Secondary | ICD-10-CM

## 2018-02-02 MED ORDER — CLONIDINE HCL 0.1 MG PO TABS: ORAL_TABLET | ORAL | 3 refills | Status: DC

## 2018-02-02 MED ORDER — LOSARTAN POTASSIUM 100 MG PO TABS: 100.0000 mg | ORAL_TABLET | Freq: Every day | ORAL | 4 refills | Status: DC

## 2018-02-02 MED ORDER — NEBIVOLOL HCL 10 MG PO TABS: 10.0000 mg | ORAL_TABLET | Freq: Every day | ORAL | 4 refills | Status: DC

## 2018-02-02 NOTE — Patient Instructions (Addendum)
Medication Instructions:  No changes  If you need a refill on your cardiac medications before your next appointment, please call your pharmacy.  Medication Samples have been provided to the patient.  Drug name: Bystolic       Strength: 10 mg        Qty: 4 boxes  LOT: [id number]  Exp.Date: 6/21    Lab work: No new labs needed   If you have labs (blood work) drawn today and your tests are completely normal, you will receive your results only by: Marland Kitchen MyChart Message (if you have MyChart) OR . A paper copy in the mail If you have any lab test that is abnormal or we need to change your treatment, we will call you to review the results.   Testing/Procedures: No new testing needed   Follow-Up: At [facility], you and your health needs are our priority.  As part of our continuing mission to provide you with exceptional heart care, we have created designated Provider Care Teams.  These Care Teams include your primary Cardiologist (physician) and Advanced Practice Providers (APPs -  Physician Assistants and Nurse Practitioners) who all work together to provide you with the care you need, when you need it.  . You will need a follow up appointment as needed  . Providers on your designated Care Team:   . Murray Hodgkins, NP . Christell Faith, PA-C . Marrianne Mood, PA-C  Any Other Special Instructions Will Be Listed Below (If Applicable).  For educational health videos Log in to : [email or web address] Or : [email or web address], password : [email or web address]

## 2018-02-21 DIAGNOSIS — Z961 Presence of intraocular lens: Secondary | ICD-10-CM | POA: Diagnosis not present

## 2018-02-21 DIAGNOSIS — H26492 Other secondary cataract, left eye: Secondary | ICD-10-CM | POA: Diagnosis not present

## 2018-02-21 DIAGNOSIS — H18413 Arcus senilis, bilateral: Secondary | ICD-10-CM | POA: Diagnosis not present

## 2018-02-21 DIAGNOSIS — H02831 Dermatochalasis of right upper eyelid: Secondary | ICD-10-CM | POA: Diagnosis not present

## 2018-02-27 ENCOUNTER — Other Ambulatory Visit: Payer: Self-pay

## 2018-02-27 NOTE — Telephone Encounter (Signed)
*  STAT* If patient is at the pharmacy, call can be transferred to refill team.   1. Which medications need to be refilled? (please list name of each medication and dose if known) Losartan  2. Which pharmacy/location (including street and city if local pharmacy) is medication to be sent to? [facility] [place]  3. Do they need a 30 day or 90 day supply? 90  

## 2018-02-27 NOTE — Telephone Encounter (Signed)
Medication has already been sent in.   losartan (COZAAR) 100 MG tablet 90 tablet 4 02/02/2018    Sig - Route: Take 1 tablet (100 mg total) by mouth daily. - Oral   Sent to pharmacy as: losartan (COZAAR) 100 MG tablet   E-Prescribing Status: Receipt confirmed by pharmacy (02/02/2018 3:33 PM EST)   Pharmacy   [id number] [place], [place]

## 2018-02-28 DIAGNOSIS — H26491 Other secondary cataract, right eye: Secondary | ICD-10-CM | POA: Diagnosis not present

## 2018-02-28 DIAGNOSIS — Z961 Presence of intraocular lens: Secondary | ICD-10-CM | POA: Diagnosis not present

## 2018-03-06 DIAGNOSIS — G2581 Restless legs syndrome: Secondary | ICD-10-CM | POA: Diagnosis not present

## 2018-03-21 DIAGNOSIS — I10 Essential (primary) hypertension: Secondary | ICD-10-CM | POA: Diagnosis not present

## 2018-03-21 DIAGNOSIS — E7800 Pure hypercholesterolemia, unspecified: Secondary | ICD-10-CM | POA: Diagnosis not present

## 2018-03-27 ENCOUNTER — Telehealth: Payer: Self-pay | Admitting: Cardiovascular Disease

## 2018-03-27 NOTE — Telephone Encounter (Signed)
Patient calling  States that her pharmacist stated that the losartan is coming from [place] and they do not know when they will be getting anymore Patient would like to discuss other options available  Please call to discuss

## 2018-03-27 NOTE — Telephone Encounter (Signed)
Spoke with the pt. [facility] and are not able to refill it at this time. She does have quite a few pills on hand, but she was wanting to be cautious and not run out. Advised patient to contact another [facility] than [facility], she can rqst to have her prescription transferred. She has questions relating to the pharmacies medication supplier. I adv her that she will need to contact the pharmacy for that information. She expressed concern taking any medication that came from [place]. Adv her to contact the pharmacy first, if there is an issue she can give [place] a call back for alternative. Patient is agreeable with the plan and voiced appreciation for the call.

## 2018-04-17 DIAGNOSIS — G4733 Obstructive sleep apnea (adult) (pediatric): Secondary | ICD-10-CM | POA: Diagnosis not present

## 2018-04-19 DIAGNOSIS — I10 Essential (primary) hypertension: Secondary | ICD-10-CM | POA: Diagnosis not present

## 2018-05-09 DIAGNOSIS — R51 Headache: Secondary | ICD-10-CM | POA: Diagnosis not present

## 2018-05-15 ENCOUNTER — Telehealth: Payer: Self-pay | Admitting: Cardiovascular Disease

## 2018-05-15 NOTE — Telephone Encounter (Signed)
Pt c/o BP issue: STAT if pt c/o blurred vision, one-sided weakness or slurred speech  1. What are your last 5 BP readings? TRENDING 170/54   2. Are you having any other symptoms (ex. Dizziness, headache, blurred vision, passed out)? Sharpe pain in head patient concerned for TIA vs Aneurysm   3. What is your BP issue? Elevated    Scheduled for virtual with gollan 4/21

## 2018-05-15 NOTE — Telephone Encounter (Signed)
Spoke with the pt. Pt sts that she developed a sharp pain in the left temple. Over a week ago it was short in duration, but was like nothing she has experienced before. She denies change in vision, speech, or weakness. She has had 2-3 reoccurrences that were not as intense. She notes that her BP has been elevated with those events. Pt sts that she took her medication yesterday late her BP after medication 170/54. Pt sts that her BP this morning was also elevated.   I asked the pt to check her BP while I held the line. Pt BP 146/64 49bpm. Pt is currently asymptomatic. She does report being more fatigued over the last 2 weeks. Pt is scheduled for a telemedicine visit tomorrow 05/16/18 with Dr.Gollan. adv the pt that I will fwd an update to him. Adv her on TIA symptoms and adv her to seek emergent care of symptoms of TIA develop. Pt verbalized understanding and voiced appreciation for the call.

## 2018-05-15 NOTE — Telephone Encounter (Signed)
Virtual Visit Pre-Appointment Phone Call  Steps For Call:  1. Confirm consent - "In the setting of the current Covid19 crisis, you are scheduled for a (phone or video) visit with your provider on (date) at (time).  Just as we do with many in-office visits, in order for you to participate in this visit, we must obtain consent.  If you'd like, I can send this to your mychart (if signed up) or email for you to review.  Otherwise, I can obtain your verbal consent now.  All virtual visits are billed to your insurance company just like a normal visit would be.  By agreeing to a virtual visit, we'd like you to understand that the technology does not allow for your provider to perform an examination, and thus may limit your provider's ability to fully assess your condition. If your provider identifies any concerns that need to be evaluated in person, we will make arrangements to do so.  Finally, though the technology is pretty good, we cannot assure that it will always work on either your or our end, and in the setting of a video visit, we may have to convert it to a phone-only visit.  In either situation, we cannot ensure that we have a secure connection.  Are you willing to proceed?" STAFF: Did the patient verbally acknowledge consent to telehealth visit? Document YES/NO here: YES  2. Confirm the BEST phone number to call the day of the visit by including in appointment notes  3. Give patient instructions for WebEx/MyChart download to smartphone as below or Doximity/Doxy.me if video visit (depending on what platform provider is using)  4. Advise patient to be prepared with their blood pressure, heart rate, weight, any heart rhythm information, their current medicines, and a piece of paper and pen handy for any instructions they may receive the day of their visit  5. Inform patient they will receive a phone call 15 minutes prior to their appointment time (may be from unknown caller ID) so they should be  prepared to answer  6. Confirm that appointment type is correct in Epic appointment notes (VIDEO vs PHONE)     TELEPHONE CALL NOTE  Yvonne Davidson has been deemed a candidate for a follow-up tele-health visit to limit community exposure during the Covid-19 pandemic. I spoke with the patient via phone to ensure availability of phone/video source, confirm preferred email & phone number, and discuss instructions and expectations.  I reminded Yvonne Davidson to be prepared with any vital sign and/or heart rhythm information that could potentially be obtained via home monitoring, at the time of her visit. I reminded Yvonne Davidson to expect a phone call at the time of her visit if her visit.  Clarisse Gouge 05/15/2018 2:27 PM   INSTRUCTIONS FOR DOWNLOADING THE [facility] APP TO SMARTPHONE  - If Apple, ask patient to go to App Store and type in WebEx in the search bar. [place], the blue/green circle. If Android, go to [employer] and type in [employer] in the search bar. The app is free but as with any other app downloads, their phone may require them to verify saved payment information or Apple/Android password.  - The patient does NOT have to create an account. - On the day of the visit, the assist will walk the patient through joining the meeting with the meeting number/password.  INSTRUCTIONS FOR DOWNLOADING THE MYCHART APP TO SMARTPHONE  - The patient must first make  sure to have activated MyChart and know their login information - If Apple, go to [employer] and type in MyChart in the search bar and download the app. If Android, ask patient to go to [employer] and type in [place] in the search bar and download the app. The app is free but as with any other app downloads, their phone may require them to verify saved payment information or Apple/Android password.  - The patient will need to then log into the app with their MyChart username and password,  and select [facility] as their healthcare provider to link the account. When it is time for your visit, go to the MyChart app, find appointments, and click Begin Video Visit. Be sure to Select Allow for your device to access the Microphone and Camera for your visit. You will then be connected, and your provider will be with you shortly.  **If they have any issues connecting, or need assistance please contact MyChart service desk [phone])**  **If using a computer, in order to ensure the best quality for their visit they will need to use either of the following Internet Browsers: [employer], or Google Chrome**  IF USING DOXIMITY or DOXY.ME - The patient will receive a link just prior to their visit, either by text or email (to be determined day of appointment depending on if it's doxy.me or Doximity).     FULL LENGTH CONSENT FOR TELE-HEALTH VISIT   I hereby voluntarily request, consent and authorize [facility] and its employed or contracted physicians, physician assistants, nurse practitioners or other licensed health care professionals (the Practitioner), to provide me with telemedicine health care services (the Services") as deemed necessary by the treating Practitioner. I acknowledge and consent to receive the Services by the Practitioner via telemedicine. I understand that the telemedicine visit will involve communicating with the Practitioner through live audiovisual communication technology and the disclosure of certain medical information by electronic transmission. I acknowledge that I have been given the opportunity to request an in-person assessment or other available alternative prior to the telemedicine visit and am voluntarily participating in the telemedicine visit.  I understand that I have the right to withhold or withdraw my consent to the use of telemedicine in the course of my care at any time, without affecting my right to future care or treatment, and  that the Practitioner or I may terminate the telemedicine visit at any time. I understand that I have the right to inspect all information obtained and/or recorded in the course of the telemedicine visit and may receive copies of available information for a reasonable fee.  I understand that some of the potential risks of receiving the Services via telemedicine include:   Delay or interruption in medical evaluation due to technological equipment failure or disruption;  Information transmitted may not be sufficient (e.g. poor resolution of images) to allow for appropriate medical decision making by the Practitioner; and/or   In rare instances, security protocols could fail, causing a breach of personal health information.  Furthermore, I acknowledge that it is my responsibility to provide information about my medical history, conditions and care that is complete and accurate to the best of my ability. I acknowledge that Practitioner's advice, recommendations, and/or decision may be based on factors not within their control, such as incomplete or inaccurate data provided by me or distortions of diagnostic images or specimens that may result from electronic transmissions. I understand that the practice of medicine is not an exact  science and that Practitioner makes no warranties or guarantees regarding treatment outcomes. I acknowledge that I will receive a copy of this consent concurrently upon execution via email to the email address I last provided but may also request a printed copy by calling the office of [facility].    I understand that my insurance will be billed for this visit.   I have read or had this consent read to me.  I understand the contents of this consent, which adequately explains the benefits and risks of the Services being provided via telemedicine.   I have been provided ample opportunity to ask questions regarding this consent and the Services and have had my questions  answered to my satisfaction.  I give my informed consent for the services to be provided through the use of telemedicine in my medical care  By participating in this telemedicine visit I agree to the above.

## 2018-05-15 NOTE — Progress Notes (Signed)
Virtual Visit via Video Note   This visit type was conducted due to national recommendations for restrictions regarding the COVID-19 Pandemic (e.g. social distancing) in an effort to limit this patient's exposure and mitigate transmission in our community.  Due to her co-morbid illnesses, this patient is at least at moderate risk for complications without adequate follow up.  This format is felt to be most appropriate for this patient at this time.  All issues noted in this document were discussed and addressed.  A limited physical exam was performed with this format.  Please refer to the patient's chart for her consent to telehealth for [facility].   I connected with  Yvonne Davidson on 05/15/18 by a video enabled telemedicine application and verified that I am speaking with the correct person using two identifiers. I discussed the limitations of evaluation and management by telemedicine. The patient expressed understanding and agreed to proceed.   Evaluation Performed:  Follow-up visit  Date:  05/15/2018   ID:  Yvonne Davidson, DOB 09-23-1940, MRN [id number]  Patient Location:  [place] [address]   Provider location:   [facility], [place] office  PCP:  Dion Body, MD  Cardiologist:  Patsy Baltimore   Chief Complaint:  Temporal pain on left    History of Present Illness:    Yvonne Davidson is a 78 y.o. female who presents via audio/video conferencing for a telehealth visit today.   The patient does not symptoms concerning for COVID-19 infection (fever, chills, cough, or new SHORTNESS OF BREATH).   Patient has a past medical history of 60-79% carotid disease on the left, 40-59% disease on the right,  Hyperlipidemia DM hypertension,  obstructive sleep apnea who is followed at [facility],  chronic pain in her legs,  GERD,  She presents for follow-up of her blood pressure and carotid disease  Pain up left side  of her head Then "horizontal pain" off and on for 2 days Started 2 weeks ago That side of head feels tender  sleep apnea, wears nasal pillow with a chin strap, mouthguard Wears it very tight  Seen by PMD, for temporal pain Labs ordered She declined at the time, nervous about cross contamination  Denies any other chest pain or shortness of breath symptoms History of anxiety Previously Not sleeping well, nervous  Blood pressure stable Losartan in the Am, bystolic 10 mg at dinner Bedtime takes clonidine Sometimes additional 1/2 pill of the clonidine  Intolerances:  off amlodipine secondary to leg swelling  Unable to tolerate any diuretics per the patient including HCTZ, Lasix, Aldactone She is cautious about taking extra doses of Bystolic or clonidine concerned about bradycardia but has never had symptoms  Took the "smart meter" off her house from [employer] it was causing a problem with her sleep  She stopped Crestor on her own Reports having mold in her attic in the past, felt it was affecting her breathing Had testing done on her house  Other past medical history For obstructive sleep apnea, she wears a mouth piece designed by [facility] dent[facility] . she has chronic aching in her legs. She attributes this to sleep apnea and poor blood supply to her legs  She retired at the age of 39. She was working in [employer]. She was unable to maintain the pace with such poor sleep on a chronic basis   Prior CV studies:   The following studies were reviewed  today:    Past Medical History:  Diagnosis Date  . Allergy   . Anemia   . Anxiety   . Arthritis   . Bilateral cataracts    immature  . Chronic sinusitis    takes Cetirizine daily  . Depression    doesn't take any meds  . Diabetes mellitus without complication (HCC)    borderline  . Diverticulosis   . Dry eyes   . GERD (gastroesophageal reflux disease)    TAKES OMEPRAZOLE DAILY  . Hashimoto's  thyroiditis   . History of blood transfusion    in the 60's no abnormal reaction noted  . History of echocardiogram    a. 05/2011: EF 60%, trace AI, trace MR, mild TR  . Hyperlipidemia    a. self discontinued statin  . Hypertension   . Insomnia    takes Trazodone nightly  . Medication intolerance   . OSA (obstructive sleep apnea)    doesn't use a cpap  . Osteoporosis    takes Vit D as needed  . PONV (postoperative nausea and vomiting)    hard to wake up   Past Surgical History:  Procedure Laterality Date  . CHOLECYSTECTOMY  12/07/2012   Dr Lucia Gaskins  . CHOLECYSTECTOMY N/A 12/07/2012   Procedure: LAPAROSCOPIC CHOLECYSTECTOMY WITH INTRAOPERATIVE CHOLANGIOGRAM;  Surgeon: Shann Medal, MD;  Location: [place];  Service: General;  Laterality: N/A;  . LIPOSUCTION    . NASAL SINUS SURGERY  2006   x 2  . right arm surgery  1960  . TCS       No outpatient medications have been marked as taking for the 05/16/18 encounter (Appointment) with [name], MD.     Allergies:   Buprenorphine hcl; Chlorphen-diphenhyd-pe-apap; Codeine; Mold extract [trichophyton mentagrophyte]; Morphine and related; Oxycodone; Oxycodone hcl; Oxycodone hcl; Sumycin [tetracycline hcl]; Cardura [doxazosin mesylate]; Chlorphen-phenyleph-asa; Decongest-aid [pseudoephedrine]; Esomeprazole magnesium; Esomeprazole magnesium; Hydralazine; Hydrochlorothiazide; Indomethacin; Procaine hcl; Keflex [cephalexin]; Latex; Neomycin-bacitracin zn-polymyx; Pseudoephedrine hcl er; Sudafed pe cold & cough child  [phenylephrine-dm]; Telithromycin; and Tetracycline   Social History   Tobacco Use  . Smoking status: Never Smoker  . Smokeless tobacco: Never Used  . Tobacco comment: quit in 1978  Substance Use Topics  . Alcohol use: No    Alcohol/week: 0.0 standard drinks  . Drug use: No     Current Outpatient Medications on File Prior to Visit  Medication Sig Dispense Refill  . amoxicillin (AMOXIL) 500 MG capsule Take 1  capsule (500 mg total) by mouth 3 (three) times daily. 30 capsule 0  . Artificial Tear Ointment (REFRESH LACRI-LUBE) OINT Apply 1 application to eye at bedtime.    . Ascorbic Acid (VITAMIN C PO) Take by mouth.    . B-D ULTRA-FINE 33 LANCETS MISC Use as directed. Dx 250.0    . Barberry-Oreg Grape-Goldenseal (BERBERINE COMPLEX PO) Take by mouth.    . Blood Glucose Monitoring Suppl (ONE TOUCH ULTRA MINI) W/DEVICE KIT USE AS DIRECTED 1 each 0  . calcium carbonate (OS-CAL - DOSED IN MG OF ELEMENTAL CALCIUM) 1250 (500 CA) MG tablet Take by mouth.    . carboxymethylcellulose (REFRESH PLUS) 0.5 % SOLN Place 1 drop into both eyes 3 (three) times daily as needed (for dry eyes).     . Cholecalciferol (VITAMIN D3) 1000 UNITS CAPS Take by mouth.    . cloNIDine (CATAPRES) 0.1 MG tablet Take 1 tablet (0.1 mg) by mouth two times a day. 180 tablet 3  . Cyanocobalamin 1000 MCG TBCR Take by  mouth.    . ESOMEPRAZOLE STRONTIUM PO Take by mouth.    . losartan (COZAAR) 100 MG tablet Take 1 tablet (100 mg total) by mouth daily. 90 tablet 4  . metFORMIN (GLUCOPHAGE) 500 MG tablet Take 1 tablet (500 mg total) by mouth 2 (two) times daily with a meal. (Patient taking differently: Take 500 mg by mouth daily. Patent takes 1/2 tablet by mouth BID) 60 tablet 3  . METHYLSULFONYLMETHANE PO Take by mouth.    . Multiple Vitamins-Minerals (SENIOR MULTIVITAMIN PLUS PO) Take by mouth.    . nebivolol (BYSTOLIC) 10 MG tablet Take 1 tablet (10 mg total) by mouth daily. 90 tablet 4  . Omega-3 Fatty Acids (FISH OIL) 1000 MG CAPS Take by mouth.    Glory Rosebush DELICA LANCETS 08M MISC Use as directed. Dx 250.0 100 each 3  . PROBIOTIC PRODUCT PO Take by mouth.    . Red Yeast Rice Extract (RED YEAST RICE PO) Take 1,200 mg by mouth daily.    . TURMERIC PO Take by mouth.    . Ubiquinol (QUNOL COQ10/UBIQUINOL/MEGA) 100 MG CAPS Take by mouth.     No current facility-administered medications on file prior to visit.      Family Hx: The  patient's family history includes Alcohol abuse in her father; Arthritis in her mother; Cancer in her mother; Early death in her father; Heart disease in her mother; Hyperlipidemia in her mother; Hypertension in her mother; Stroke in her mother. There is no history of Breast cancer.  ROS:   Please see the history of present illness.    Review of Systems  Constitutional: Negative.   HENT:       Temporal pain  Respiratory: Negative.   Cardiovascular: Negative.   Gastrointestinal: Negative.   Musculoskeletal: Negative.   Neurological: Negative.   Psychiatric/Behavioral: Negative.   All other systems reviewed and are negative.     Labs/Other Tests and Data Reviewed:    Recent Labs: No results found for requested labs within last 8760 hours.   Recent Lipid Panel Lab Results  Component Value Date/Time   CHOL 126 03/03/2015 08:39 AM   TRIG 62 03/03/2015 08:39 AM   HDL 47 03/03/2015 08:39 AM   CHOLHDL 2.7 03/03/2015 08:39 AM   CHOLHDL 6 01/15/2014 08:17 AM   LDLCALC 67 03/03/2015 08:39 AM   LDLDIRECT 156.6 05/17/2012 03:44 PM    Wt Readings from Last 3 Encounters:  02/02/18 150 lb (68 kg)  08/18/17 148 lb 6.4 oz (67.3 kg)  05/22/17 147 lb (66.7 kg)     Exam:    Vital Signs: Vital signs may also be detailed in the HPI There were no vitals taken for this visit.  Wt Readings from Last 3 Encounters:  02/02/18 150 lb (68 kg)  08/18/17 148 lb 6.4 oz (67.3 kg)  05/22/17 147 lb (66.7 kg)   Temp Readings from Last 3 Encounters:  05/22/17 97.9 F (36.6 C) (Oral)  10/25/16 97.7 F (36.5 C) (Oral)  10/21/16 97.7 F (36.5 C) (Oral)   BP Readings from Last 3 Encounters:  02/02/18 138/66  05/22/17 (!) 159/76  01/24/17 (!) 142/60   Pulse Readings from Last 3 Encounters:  02/02/18 (!) 56  05/22/17 (!) 57  01/24/17 (!) 50    Vitals: 130s/70, pulse 50s, resp 16  Well nourished, well developed female in no acute distress. Constitutional:  oriented to person, place, and  time. No distress.  Head: Normocephalic and atraumatic.  Eyes:  no discharge. No scleral  icterus.  Neck: Normal range of motion. Neck supple.  Pulmonary/Chest: No audible wheezing, no distress, appears comfortable Musculoskeletal: Normal range of motion.  no  tenderness or deformity.  Neurological:   Coordination normal. Full exam not performed Skin:  No rash Psychiatric:  normal mood and affect. behavior is normal. Thought content normal.    ASSESSMENT & PLAN:    Bilateral carotid artery stenosis - Plan: EKG 12-Lead Does not want a statin, was doing red yeast rice Would consider repeat carotid ultrasound on next clinic visit  Hypertension goal BP (blood pressure) < 140/90 -  continue losartan, clonidine, Bystolic If blood pressure runs high could take extra dose of clonidine or Bystolic Labile numbers exacerbated by anxiety Significant medication intolerances Discussed with her in detail  Hyperlipidemia LDL goal <100 - Plan: EKG 12-Lead Previously took herself off Crestor, on red yeast rice  OSA on CPAP - Plan: EKG 12-Lead Managed by [facility] Chronic poor sleep hygiene Wears a mask  Temporal pain Long discussion, must consider temporal arteritis She was recently seen by primary care, lab work ordered but she did not complete this Recommend she consider having this lab work done which she feels most comfortable  Anxiety Prior history of anxiety concerning smart meter on the side of her house, mold in her attic amongst other things   COVID-19 Education: The signs and symptoms of COVID-19 were discussed with the patient and how to seek care for testing (follow up with PCP or arrange E-visit).  The importance of social distancing was discussed today.  Patient Risk:   After full review of this patients clinical status, I feel that they are at least moderate risk at this time.  Time:   Today, I have spent 25 minutes with the patient with telehealth technology  discussing the cardiac and medical problems/diagnoses detailed above   10 min spent reviewing the chart prior to patient visit today   Medication Adjustments/Labs and Tests Ordered: Current medicines are reviewed at length with the patient today.  Concerns regarding medicines are outlined above.   Tests Ordered: No tests ordered   Medication Changes: No changes made   Disposition: Follow-up in 12 months   Signed, Ida Rogue, MD  05/15/2018 6:00 PM    [place] Office [address], [place], [place] [address]

## 2018-05-16 ENCOUNTER — Other Ambulatory Visit: Payer: Self-pay

## 2018-05-16 ENCOUNTER — Telehealth (INDEPENDENT_AMBULATORY_CARE_PROVIDER_SITE_OTHER): Payer: PPO | Admitting: Cardiovascular Disease

## 2018-05-16 DIAGNOSIS — E785 Hyperlipidemia, unspecified: Secondary | ICD-10-CM | POA: Diagnosis not present

## 2018-05-16 DIAGNOSIS — I739 Peripheral vascular disease, unspecified: Secondary | ICD-10-CM

## 2018-05-16 DIAGNOSIS — I272 Pulmonary hypertension, unspecified: Secondary | ICD-10-CM | POA: Diagnosis not present

## 2018-05-16 DIAGNOSIS — I10 Essential (primary) hypertension: Secondary | ICD-10-CM

## 2018-05-16 DIAGNOSIS — E119 Type 2 diabetes mellitus without complications: Secondary | ICD-10-CM

## 2018-05-16 DIAGNOSIS — I701 Atherosclerosis of renal artery: Secondary | ICD-10-CM

## 2018-05-16 DIAGNOSIS — I6523 Occlusion and stenosis of bilateral carotid arteries: Secondary | ICD-10-CM | POA: Diagnosis not present

## 2018-05-16 NOTE — Patient Instructions (Addendum)

## 2018-05-18 DIAGNOSIS — R51 Headache: Secondary | ICD-10-CM | POA: Diagnosis not present

## 2018-06-30 ENCOUNTER — Telehealth: Payer: Self-pay

## 2018-06-30 MED ORDER — LOSARTAN POTASSIUM 100 MG PO TABS: 100.0000 mg | ORAL_TABLET | Freq: Every day | ORAL | 4 refills | Status: DC

## 2018-06-30 NOTE — Telephone Encounter (Signed)
90 day supply sent to [facility]. 

## 2018-07-05 DIAGNOSIS — E119 Type 2 diabetes mellitus without complications: Secondary | ICD-10-CM | POA: Diagnosis not present

## 2018-07-05 DIAGNOSIS — I10 Essential (primary) hypertension: Secondary | ICD-10-CM | POA: Diagnosis not present

## 2018-07-05 DIAGNOSIS — E039 Hypothyroidism, unspecified: Secondary | ICD-10-CM | POA: Diagnosis not present

## 2018-07-12 DIAGNOSIS — I10 Essential (primary) hypertension: Secondary | ICD-10-CM | POA: Diagnosis not present

## 2018-07-12 DIAGNOSIS — E1159 Type 2 diabetes mellitus with other circulatory complications: Secondary | ICD-10-CM | POA: Diagnosis not present

## 2018-08-08 DIAGNOSIS — E782 Mixed hyperlipidemia: Secondary | ICD-10-CM | POA: Diagnosis not present

## 2018-08-08 DIAGNOSIS — E1169 Type 2 diabetes mellitus with other specified complication: Secondary | ICD-10-CM | POA: Diagnosis not present

## 2018-08-08 DIAGNOSIS — I10 Essential (primary) hypertension: Secondary | ICD-10-CM | POA: Diagnosis not present

## 2018-08-08 DIAGNOSIS — E785 Hyperlipidemia, unspecified: Secondary | ICD-10-CM | POA: Diagnosis not present

## 2018-08-14 DIAGNOSIS — E785 Hyperlipidemia, unspecified: Secondary | ICD-10-CM | POA: Diagnosis not present

## 2018-08-14 DIAGNOSIS — E782 Mixed hyperlipidemia: Secondary | ICD-10-CM | POA: Diagnosis not present

## 2018-08-14 DIAGNOSIS — I10 Essential (primary) hypertension: Secondary | ICD-10-CM | POA: Diagnosis not present

## 2018-08-14 DIAGNOSIS — E1169 Type 2 diabetes mellitus with other specified complication: Secondary | ICD-10-CM | POA: Diagnosis not present

## 2018-08-28 DIAGNOSIS — D2261 Melanocytic nevi of right upper limb, including shoulder: Secondary | ICD-10-CM | POA: Diagnosis not present

## 2018-08-28 DIAGNOSIS — Z08 Encounter for follow-up examination after completed treatment for malignant neoplasm: Secondary | ICD-10-CM | POA: Diagnosis not present

## 2018-08-28 DIAGNOSIS — D225 Melanocytic nevi of trunk: Secondary | ICD-10-CM | POA: Diagnosis not present

## 2018-08-28 DIAGNOSIS — L821 Other seborrheic keratosis: Secondary | ICD-10-CM | POA: Diagnosis not present

## 2018-08-28 DIAGNOSIS — L538 Other specified erythematous conditions: Secondary | ICD-10-CM | POA: Diagnosis not present

## 2018-08-28 DIAGNOSIS — D2272 Melanocytic nevi of left lower limb, including hip: Secondary | ICD-10-CM | POA: Diagnosis not present

## 2018-08-28 DIAGNOSIS — Z85828 Personal history of other malignant neoplasm of skin: Secondary | ICD-10-CM | POA: Diagnosis not present

## 2018-08-28 DIAGNOSIS — D2262 Melanocytic nevi of left upper limb, including shoulder: Secondary | ICD-10-CM | POA: Diagnosis not present

## 2018-08-28 DIAGNOSIS — D2271 Melanocytic nevi of right lower limb, including hip: Secondary | ICD-10-CM | POA: Diagnosis not present

## 2018-08-28 DIAGNOSIS — L820 Inflamed seborrheic keratosis: Secondary | ICD-10-CM | POA: Diagnosis not present

## 2018-09-08 DIAGNOSIS — H02831 Dermatochalasis of right upper eyelid: Secondary | ICD-10-CM | POA: Diagnosis not present

## 2018-09-08 DIAGNOSIS — H02834 Dermatochalasis of left upper eyelid: Secondary | ICD-10-CM | POA: Diagnosis not present

## 2018-09-08 DIAGNOSIS — H18413 Arcus senilis, bilateral: Secondary | ICD-10-CM | POA: Diagnosis not present

## 2018-09-08 DIAGNOSIS — Z961 Presence of intraocular lens: Secondary | ICD-10-CM | POA: Diagnosis not present

## 2018-11-09 DIAGNOSIS — K2100 Gastro-esophageal reflux disease with esophagitis, without bleeding: Secondary | ICD-10-CM | POA: Diagnosis not present

## 2018-12-04 DIAGNOSIS — G4733 Obstructive sleep apnea (adult) (pediatric): Secondary | ICD-10-CM | POA: Diagnosis not present

## 2018-12-05 DIAGNOSIS — G4733 Obstructive sleep apnea (adult) (pediatric): Secondary | ICD-10-CM | POA: Diagnosis not present

## 2019-01-22 DIAGNOSIS — Z711 Person with feared health complaint in whom no diagnosis is made: Secondary | ICD-10-CM | POA: Diagnosis not present

## 2019-01-31 DIAGNOSIS — K5730 Diverticulosis of large intestine without perforation or abscess without bleeding: Secondary | ICD-10-CM | POA: Diagnosis not present

## 2019-01-31 DIAGNOSIS — Z1211 Encounter for screening for malignant neoplasm of colon: Secondary | ICD-10-CM | POA: Diagnosis not present

## 2019-01-31 DIAGNOSIS — K219 Gastro-esophageal reflux disease without esophagitis: Secondary | ICD-10-CM | POA: Diagnosis not present

## 2019-01-31 DIAGNOSIS — K2950 Unspecified chronic gastritis without bleeding: Secondary | ICD-10-CM | POA: Diagnosis not present

## 2019-01-31 DIAGNOSIS — R1013 Epigastric pain: Secondary | ICD-10-CM | POA: Diagnosis not present

## 2019-02-02 NOTE — Progress Notes (Signed)
Date:  02/06/2019   ID:  Yvonne Davidson, DOB 03/22/1940, MRN [id number]  Patient Location:  [address] [place] [address]   Provider location:   [facility], [place] office  PCP:  Dion Body, MD  Cardiologist:  Patsy Baltimore   Chief Complaint  Patient presents with  . office visit    Discuss elevated BP and pain in left temple; Meds verbally reviewed with patient.   History of Present Illness:    Yvonne Davidson is a 79 y.o. 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 on the left, 40-59% disease on the right,  Hyperlipidemia DM hypertension,  obstructive sleep apnea who is followed at [facility],  chronic pain in her legs,  GERD,  She presents for follow-up of her blood pressure and carotid disease  Still with temporal pain Left side, tender to touch "I can live with it"  BP elevated at home Has OSA, wears "harness",  chin strap, mouthguard Wears it very tight  Denies any other chest pain or shortness of breath symptoms History of anxiety Previously Not sleeping well, nervous  Blood pressure stable Losartan in the Am, bystolic 10 mg at dinner Bedtime takes clonidine  EKG personally reviewed by myself on todays visit Shows normal sinus rhythm rate 62 bpm no significant ST-T wave changes  Intolerances:  off amlodipine secondary to leg swelling  Unable to tolerate any diuretics per the patient including HCTZ, Lasix, Aldactone She is cautious about taking extra doses of Bystolic or clonidine concerned about bradycardia but has never had symptoms  She stopped Crestor on her own Reports having mold in her attic in the past, felt it was affecting her breathing Had testing done on her house  For obstructive sleep apnea, she wears a mouth piece designed by [facility] dentistry . she has chronic aching in her legs. She attributes this to sleep apnea and poor blood supply to her legs  She  retired at the age of 21. She was working in [employer]. She was unable to maintain the pace with such poor sleep on a chronic basis   Prior CV studies:   The following studies were reviewed today:    Past Medical History:  Diagnosis Date  . Allergy   . Anemia   . Anxiety   . Arthritis   . Bilateral cataracts    immature  . Chronic sinusitis    takes Cetirizine daily  . Depression    doesn't take any meds  . Diabetes mellitus without complication (HCC)    borderline  . Diverticulosis   . Dry eyes   . GERD (gastroesophageal reflux disease)    TAKES OMEPRAZOLE DAILY  . Hashimoto's thyroiditis   . History of blood transfusion    in the 60's no abnormal reaction noted  . History of echocardiogram    a. 05/2011: EF 60%, trace AI, trace MR, mild TR  . Hyperlipidemia    a. self discontinued statin  . Hypertension   . Insomnia    takes Trazodone nightly  . Medication intolerance   . OSA (obstructive sleep apnea)    doesn't use a cpap  . Osteoporosis    takes Vit D as needed  . PONV (postoperative nausea and vomiting)    hard to wake up   Past Surgical History:  Procedure Laterality Date  . CHOLECYSTECTOMY  12/07/2012   Dr Lucia Gaskins  . CHOLECYSTECTOMY N/A 12/07/2012  Procedure: LAPAROSCOPIC CHOLECYSTECTOMY WITH INTRAOPERATIVE CHOLANGIOGRAM;  Surgeon: Shann Medal, MD;  Location: [facility];  Service: General;  Laterality: N/A;  . LIPOSUCTION    . NASAL SINUS SURGERY  2006   x 2  . right arm surgery  1960  . TCS       Current Meds  Medication Sig  . Artificial Tear Ointment (REFRESH LACRI-LUBE) OINT Apply 1 application to eye at bedtime.  . Ascorbic Acid (VITAMIN C PO) Take by mouth daily.   . B-D ULTRA-FINE 33 LANCETS MISC Use as directed. Dx 250.0  . Blood Glucose Monitoring Suppl (ONE TOUCH ULTRA MINI) W/DEVICE KIT USE AS DIRECTED  . carboxymethylcellulose (REFRESH PLUS) 0.5 % SOLN Place 1 drop into both eyes 3 (three) times daily as needed (for dry eyes).   .  Cholecalciferol (VITAMIN D3) 1000 UNITS CAPS Take by mouth daily.   . cloNIDine (CATAPRES) 0.1 MG tablet Take 1 tablet (0.1 mg) by mouth two times a day.  . losartan (COZAAR) 100 MG tablet Take 1 tablet (100 mg total) by mouth daily.  . metFORMIN (GLUCOPHAGE) 500 MG tablet Take 1 tablet (500 mg total) by mouth 2 (two) times daily with a meal. (Patient taking differently: Take 500 mg by mouth daily. Patent takes 1/2 tablet by mouth BID)  . nebivolol (BYSTOLIC) 10 MG tablet Take 1 tablet (10 mg total) by mouth daily.  Yvonne Davidson DELICA LANCETS 06C MISC Use as directed. Dx 250.0  . PROBIOTIC PRODUCT PO Take by mouth daily.   . TURMERIC PO Take by mouth daily.   Marland Kitchen Ubiquinol (QUNOL COQ10/UBIQUINOL/MEGA) 100 MG CAPS Take by mouth daily.   Marland Kitchen zinc gluconate 50 MG tablet Take 50 mg by mouth daily.  . [DISCONTINUED] losartan (COZAAR) 100 MG tablet Take 1 tablet (100 mg total) by mouth daily.     Allergies:   Buprenorphine hcl, Chlorphen-diphenhyd-pe-apap, Codeine, Mold extract [trichophyton mentagrophyte], Morphine and related, Oxycodone, Oxycodone hcl, Oxycodone hcl, Sumycin [tetracycline hcl], Cardura [doxazosin mesylate], Chlorphen-phenyleph-asa, Decongest-aid [pseudoephedrine], Esomeprazole magnesium, Esomeprazole magnesium, Hydralazine, Hydrochlorothiazide, Indomethacin, Procaine hcl, Keflex [cephalexin], Latex, Neomycin-bacitracin zn-polymyx, Pseudoephedrine hcl er, Sudafed pe cold & cough child  [phenylephrine-dm], Telithromycin, and Tetracycline   Social History   Tobacco Use  . Smoking status: Never Smoker  . Smokeless tobacco: Never Used  . Tobacco comment: quit in 1978  Substance Use Topics  . Alcohol use: No    Alcohol/week: 0.0 standard drinks  . Drug use: No     Current Outpatient Medications on File Prior to Visit  Medication Sig Dispense Refill  . Artificial Tear Ointment (REFRESH LACRI-LUBE) OINT Apply 1 application to eye at bedtime.    . Ascorbic Acid (VITAMIN C PO) Take by  mouth daily.     . B-D ULTRA-FINE 33 LANCETS MISC Use as directed. Dx 250.0    . Blood Glucose Monitoring Suppl (ONE TOUCH ULTRA MINI) W/DEVICE KIT USE AS DIRECTED 1 each 0  . carboxymethylcellulose (REFRESH PLUS) 0.5 % SOLN Place 1 drop into both eyes 3 (three) times daily as needed (for dry eyes).     . Cholecalciferol (VITAMIN D3) 1000 UNITS CAPS Take by mouth daily.     . cloNIDine (CATAPRES) 0.1 MG tablet Take 1 tablet (0.1 mg) by mouth two times a day. 180 tablet 3  . metFORMIN (GLUCOPHAGE) 500 MG tablet Take 1 tablet (500 mg total) by mouth 2 (two) times daily with a meal. (Patient taking differently: Take 500 mg by mouth daily. Patent takes 1/2 tablet by  mouth BID) 60 tablet 3  . nebivolol (BYSTOLIC) 10 MG tablet Take 1 tablet (10 mg total) by mouth daily. 90 tablet 4  . ONETOUCH DELICA LANCETS 01T MISC Use as directed. Dx 250.0 100 each 3  . PROBIOTIC PRODUCT PO Take by mouth daily.     . TURMERIC PO Take by mouth daily.     Marland Kitchen Ubiquinol (QUNOL COQ10/UBIQUINOL/MEGA) 100 MG CAPS Take by mouth daily.     Marland Kitchen zinc gluconate 50 MG tablet Take 50 mg by mouth daily.    Marland Kitchen amoxicillin (AMOXIL) 500 MG capsule Take 1 capsule (500 mg total) by mouth 3 (three) times daily. (Patient not taking: Reported on 02/06/2019) 30 capsule 0   No current facility-administered medications on file prior to visit.     Family Hx: The patient's family history includes Alcohol abuse in her father; Aneurysm in her mother; Arthritis in her mother; Cancer in her mother; Early death in her father; Heart disease in her mother; Hyperlipidemia in her mother; Hypertension in her mother; Stroke in her mother. There is no history of Breast cancer.  ROS:   Please see the history of present illness.    Review of Systems  Constitutional: Negative.   HENT:       Temporal pain  Respiratory: Negative.   Cardiovascular: Negative.   Gastrointestinal: Negative.   Musculoskeletal: Negative.   Neurological: Negative.     Psychiatric/Behavioral: Negative.   All other systems reviewed and are negative.     Labs/Other Tests and Data Reviewed:    Recent Labs: No results found for requested labs within last 8760 hours.   Recent Lipid Panel Lab Results  Component Value Date/Time   CHOL 126 03/03/2015 08:39 AM   TRIG 62 03/03/2015 08:39 AM   HDL 47 03/03/2015 08:39 AM   CHOLHDL 2.7 03/03/2015 08:39 AM   CHOLHDL 6 01/15/2014 08:17 AM   LDLCALC 67 03/03/2015 08:39 AM   LDLDIRECT 156.6 05/17/2012 03:44 PM    Wt Readings from Last 3 Encounters:  02/06/19 153 lb 4 oz (69.5 kg)  02/02/18 150 lb (68 kg)  08/18/17 148 lb 6.4 oz (67.3 kg)     Exam:    Vital Signs: Vital signs may also be detailed in the HPI BP (!) 164/82 (BP Location: Right Arm, Patient Position: Sitting, Cuff Size: Normal)   Pulse 62   Ht '5\' 3"'  (1.6 m)   Wt 153 lb 4 oz (69.5 kg)   SpO2 97%   BMI 27.15 kg/m    Constitutional:  oriented to person, place, and time. No distress.  HENT:  Head: Grossly normal Eyes:  no discharge. No scleral icterus.  Neck: No JVD, no carotid bruits  Cardiovascular: Regular rate and rhythm, no murmurs appreciated Pulmonary/Chest: Clear to auscultation bilaterally, no wheezes or rails Abdominal: Soft.  no distension.  no tenderness.  Musculoskeletal: Normal range of motion Neurological:  normal muscle tone. Coordination normal. No atrophy Skin: Skin warm and dry Psychiatric: normal affect, pleasant   ASSESSMENT & PLAN:    Bilateral carotid artery stenosis - Plan: EKG 12-Lead Does not want a statin,  doing red yeast rice periodic u/s  Hypertension goal BP (blood pressure) < 140/90 -  continue losartan, clonidine, Bystolic Will add little clonidine 0.5 to 0.1 mg in the AM Previously concerned about bradycardia but was asymptomatic  Hyperlipidemia LDL goal <100 - Plan: EKG 12-Lead Previously took herself off Crestor, on red yeast rice  OSA on CPAP - Plan: EKG 12-Lead Managed by  [facility] Chronic poor sleep hygiene Wears a mask, pushes on her face , causing tempral pain  Temporal pain Suspect pressure from OSA strap  Anxiety Stable Followed by primary care Recommended regular walking program    Total encounter time more than 25 minutes  Greater than 50% was spent in counseling and coordination of care with the patient   Disposition: Follow-up in 12 months   Signed, Ida Rogue, MD  02/06/2019 12:38 PM    [place] [facility] [address], [place], [place] [address]

## 2019-02-06 ENCOUNTER — Encounter: Payer: Self-pay | Admitting: Cardiovascular Disease

## 2019-02-06 ENCOUNTER — Ambulatory Visit: Payer: PPO | Admitting: Cardiovascular Disease

## 2019-02-06 ENCOUNTER — Other Ambulatory Visit: Payer: Self-pay

## 2019-02-06 VITALS — BP 164/82 | HR 62 | Ht 63.0 in | Wt 153.2 lb

## 2019-02-06 DIAGNOSIS — F419 Anxiety disorder, unspecified: Secondary | ICD-10-CM | POA: Diagnosis not present

## 2019-02-06 DIAGNOSIS — I6523 Occlusion and stenosis of bilateral carotid arteries: Secondary | ICD-10-CM

## 2019-02-06 DIAGNOSIS — E785 Hyperlipidemia, unspecified: Secondary | ICD-10-CM

## 2019-02-06 DIAGNOSIS — E119 Type 2 diabetes mellitus without complications: Secondary | ICD-10-CM | POA: Diagnosis not present

## 2019-02-06 DIAGNOSIS — I701 Atherosclerosis of renal artery: Secondary | ICD-10-CM | POA: Diagnosis not present

## 2019-02-06 DIAGNOSIS — I739 Peripheral vascular disease, unspecified: Secondary | ICD-10-CM

## 2019-02-06 DIAGNOSIS — I272 Pulmonary hypertension, unspecified: Secondary | ICD-10-CM | POA: Diagnosis not present

## 2019-02-06 DIAGNOSIS — I10 Essential (primary) hypertension: Secondary | ICD-10-CM | POA: Diagnosis not present

## 2019-02-06 MED ORDER — LOSARTAN POTASSIUM 100 MG PO TABS: 100.0000 mg | ORAL_TABLET | Freq: Every day | ORAL | 4 refills | Status: DC

## 2019-02-06 NOTE — Patient Instructions (Addendum)
Medication Instructions:  Clonidine up to 0.1 mg twice a day  If you need a refill on your cardiac medications before your next appointment, please call your pharmacy.    Lab work: No new labs needed   If you have labs (blood work) drawn today and your tests are completely normal, you will receive your results only by: Marland Kitchen MyChart Message (if you have MyChart) OR . A paper copy in the mail If you have any lab test that is abnormal or we need to change your treatment, we will call you to review the results.   Testing/Procedures: No new testing needed   Follow-Up: At [facility], you and your health needs are our priority.  As part of our continuing mission to provide you with exceptional heart care, we have created designated Provider Care Teams.  These Care Teams include your primary Cardiologist (physician) and Advanced Practice Providers (APPs -  Physician Assistants and Nurse Practitioners) who all work together to provide you with the care you need, when you need it.  . You will need a follow up appointment in 12 months   . Providers on your designated Care Team:   . Murray Hodgkins, NP . Christell Faith, PA-C . Marrianne Mood, PA-C  Any Other Special Instructions Will Be Listed Below (If Applicable).  For educational health videos Log in to : [email or web address] Or : [email or web address], password : [email or web address]

## 2019-02-07 ENCOUNTER — Telehealth: Payer: Self-pay | Admitting: Cardiovascular Disease

## 2019-02-07 NOTE — Telephone Encounter (Signed)
Patient calling in with a request "needs a Rx, due to PAD, for thigh highs extra strong support hose" she states she needs a Rx for insurance to pay for these since they are costly.  Please advise

## 2019-02-07 NOTE — Telephone Encounter (Signed)
Spoke with patient and reviewed that she can buy compression hose at various pharmacies and even the medical supply store. Reviewed that we have not written prescription for this in some time but if she goes to the medical supply store they can complete form and fax to her primary care provider for signature and approval. Recommended that she check with [facility] to see if they still have some in store for her to view and determine which ones would be best for her. She verbalized understanding and would check with them.

## 2019-02-12 DIAGNOSIS — I10 Essential (primary) hypertension: Secondary | ICD-10-CM | POA: Diagnosis not present

## 2019-02-12 DIAGNOSIS — E1159 Type 2 diabetes mellitus with other circulatory complications: Secondary | ICD-10-CM | POA: Diagnosis not present

## 2019-02-12 DIAGNOSIS — E785 Hyperlipidemia, unspecified: Secondary | ICD-10-CM | POA: Diagnosis not present

## 2019-02-12 DIAGNOSIS — E782 Mixed hyperlipidemia: Secondary | ICD-10-CM | POA: Diagnosis not present

## 2019-02-12 DIAGNOSIS — E1169 Type 2 diabetes mellitus with other specified complication: Secondary | ICD-10-CM | POA: Diagnosis not present

## 2019-02-14 DIAGNOSIS — E785 Hyperlipidemia, unspecified: Secondary | ICD-10-CM | POA: Diagnosis not present

## 2019-02-14 DIAGNOSIS — I10 Essential (primary) hypertension: Secondary | ICD-10-CM | POA: Diagnosis not present

## 2019-02-14 DIAGNOSIS — Z0000 Encounter for general adult medical examination without abnormal findings: Secondary | ICD-10-CM | POA: Diagnosis not present

## 2019-02-14 DIAGNOSIS — E1169 Type 2 diabetes mellitus with other specified complication: Secondary | ICD-10-CM | POA: Diagnosis not present

## 2019-02-14 DIAGNOSIS — E782 Mixed hyperlipidemia: Secondary | ICD-10-CM | POA: Diagnosis not present

## 2019-02-22 ENCOUNTER — Other Ambulatory Visit: Payer: Self-pay | Admitting: Family Medicine

## 2019-02-23 ENCOUNTER — Other Ambulatory Visit: Payer: Self-pay | Admitting: Family Medicine

## 2019-02-23 DIAGNOSIS — Z1231 Encounter for screening mammogram for malignant neoplasm of breast: Secondary | ICD-10-CM

## 2019-03-05 ENCOUNTER — Other Ambulatory Visit: Payer: Self-pay | Admitting: Family Medicine

## 2019-03-05 DIAGNOSIS — N644 Mastodynia: Secondary | ICD-10-CM | POA: Diagnosis not present

## 2019-03-09 ENCOUNTER — Ambulatory Visit
Admission: RE | Admit: 2019-03-09 | Discharge: 2019-03-09 | Disposition: A | Discharge status: Home / Self Care | Payer: PPO | Source: Ambulatory Visit | Attending: Family Medicine | Admitting: Family Medicine

## 2019-03-09 DIAGNOSIS — N644 Mastodynia: Secondary | ICD-10-CM

## 2019-03-09 DIAGNOSIS — R928 Other abnormal and inconclusive findings on diagnostic imaging of breast: Secondary | ICD-10-CM | POA: Diagnosis not present

## 2019-03-13 DIAGNOSIS — G4733 Obstructive sleep apnea (adult) (pediatric): Secondary | ICD-10-CM | POA: Diagnosis not present

## 2019-04-19 ENCOUNTER — Other Ambulatory Visit: Payer: Self-pay | Admitting: Cardiovascular Disease

## 2019-04-30 ENCOUNTER — Telehealth: Payer: Self-pay | Admitting: Cardiovascular Disease

## 2019-04-30 NOTE — Telephone Encounter (Signed)
Spoke with patient and she states that her blood pressures have been elevated. She has been having this problem going on for some time. She is terrified of having a stroke due to these elevated pressures. She takes her clonidine at night and 5 hours later her blood pressure is raging. She also reports some fatigue but is really worried due to these elevated readings. Confirmed her appointment for tomorrow and reviewed that we need her to monitor blood pressures 2 hours after medications and to avoid caffeine, exercise, eating, or smoking, and alcohol. Confirmed appointment for tomorrow with provider and she verbalized understanding with no further questions at this time.

## 2019-04-30 NOTE — Telephone Encounter (Signed)
Pt c/o BP issue: STAT if pt c/o blurred vision, one-sided weakness or slurred speech  1. What are your last 5 BP readings? Over the weekend  167/51 152/46 171/48 169/43 170/43 147/50 This morning 8 AM 152/48  2. Are you having any other symptoms (ex. Dizziness, headache, blurred vision, passed out)? Dizziness, extreme fatigue, cannot sleep  3. What is your BP issue? BP high   Patient scheduled appt with Dr Rockey Situ 05/01/19 at 3pm - would like to speak to nurse to see if appointment will be necessary

## 2019-05-01 ENCOUNTER — Encounter: Payer: Self-pay | Admitting: Cardiovascular Disease

## 2019-05-01 ENCOUNTER — Ambulatory Visit: Payer: PPO | Admitting: Cardiovascular Disease

## 2019-05-01 ENCOUNTER — Other Ambulatory Visit: Payer: Self-pay

## 2019-05-01 VITALS — BP 150/70 | HR 56 | Ht 60.0 in | Wt 154.1 lb

## 2019-05-01 DIAGNOSIS — E119 Type 2 diabetes mellitus without complications: Secondary | ICD-10-CM

## 2019-05-01 DIAGNOSIS — E785 Hyperlipidemia, unspecified: Secondary | ICD-10-CM

## 2019-05-01 DIAGNOSIS — I10 Essential (primary) hypertension: Secondary | ICD-10-CM

## 2019-05-01 DIAGNOSIS — F419 Anxiety disorder, unspecified: Secondary | ICD-10-CM

## 2019-05-01 DIAGNOSIS — I701 Atherosclerosis of renal artery: Secondary | ICD-10-CM

## 2019-05-01 DIAGNOSIS — I272 Pulmonary hypertension, unspecified: Secondary | ICD-10-CM

## 2019-05-01 DIAGNOSIS — I6523 Occlusion and stenosis of bilateral carotid arteries: Secondary | ICD-10-CM

## 2019-05-01 DIAGNOSIS — I739 Peripheral vascular disease, unspecified: Secondary | ICD-10-CM

## 2019-05-01 NOTE — Progress Notes (Signed)
Date:  05/01/2019   ID:  [name], DOB 1940/07/04, MRN [id number]  Patient Location:  [address] [place] [address]   Provider location:   [facility], [place] office  PCP:  Dion Body, MD  Cardiologist:  Patsy Baltimore   Chief Complaint  Patient presents with  . office visit    Elevated BP; Meds verbally reviewed with patient.   History of Present Illness:    Yvonne Davidson is a 79 y.o. 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 Sleeps in [place] pressure at 3 AM  possibly when she wakes up to go to the bathroom She feels that she needs additional blood pressure before she goes to bed at night as blood pressure appears to be high most checks in the middle of the morning 3 AM  BP elevated at home 376 to 283 systolic  Has OSA, wears "harness",  chin strap, mouthguard Wears it very tight  Denies any other chest pain or shortness of breath symptoms History of anxiety Previously Not sleeping well, nervous  Medications reviewed with her Losartan in the Am, bystolic 10 mg at dinner Bedtime 10 pm  takes clonidine 1/2 clonidine middle of the day  EKG personally reviewed by myself on todays visit Shows normal sinus rhythm rate 56 bpm no significant ST-T wave changes  Intolerances:  off amlodipine secondary to leg swelling  Unable to tolerate any diuretics per the patient including HCTZ, Lasix, Aldactone She is cautious about taking extra doses of Bystolic or clonidine concerned about  bradycardia but has never had symptoms  She stopped Crestor on her own Previously reported having mold in her attic in the past, felt it was affecting her breathing Had testing done on her house  For obstructive sleep apnea, she wears a mouth piece designed by [facility] dentistry . she has chronic aching in her legs. She attributes this to sleep apnea and poor blood supply to her legs  She retired at the age of 73. She was working in [employer]. She was unable to maintain the pace with such poor sleep on a chronic basis   Prior CV studies:   The following studies were reviewed today:    Past Medical History:  Diagnosis Date  . Allergy   . Anemia   . Anxiety   . Arthritis   . Bilateral cataracts    immature  . Chronic sinusitis    takes Cetirizine daily  . Depression    doesn't take any meds  . Diabetes mellitus without complication (HCC)    borderline  . Diverticulosis   . Dry eyes   . GERD (gastroesophageal reflux disease)    TAKES OMEPRAZOLE DAILY  . Hashimoto's thyroiditis   . History of blood transfusion    in the 60's no abnormal reaction noted  . History of echocardiogram    a. 05/2011: EF 60%, trace AI, trace MR, mild TR  . Hyperlipidemia    a. self discontinued statin  . Hypertension   .  Insomnia    takes Trazodone nightly  . Medication intolerance   . OSA (obstructive sleep apnea)    doesn't use a cpap  . Osteoporosis    takes Vit D as needed  . PONV (postoperative nausea and vomiting)    hard to wake up   Past Surgical History:  Procedure Laterality Date  . CHOLECYSTECTOMY  12/07/2012   Dr Lucia Gaskins  . CHOLECYSTECTOMY N/A 12/07/2012   Procedure: LAPAROSCOPIC CHOLECYSTECTOMY WITH INTRAOPERATIVE CHOLANGIOGRAM;  Surgeon: Shann Medal, MD;  Location: [facility];  Service: General;  Laterality: N/A;  . LIPOSUCTION    . NASAL SINUS SURGERY  2006   x 2  . right arm surgery  1960  . TCS       Current Meds  Medication Sig  . Artificial Tear  Ointment (REFRESH LACRI-LUBE) OINT Apply 1 application to eye at bedtime.  . Ascorbic Acid (VITAMIN C PO) Take by mouth daily.   . B-D ULTRA-FINE 33 LANCETS MISC Use as directed. Dx 250.0  . Blood Glucose Monitoring Suppl (ONE TOUCH ULTRA MINI) W/DEVICE KIT USE AS DIRECTED  . BYSTOLIC 10 MG tablet TAKE 1 TABLET BY MOUTH EVERY DAY  . carboxymethylcellulose (REFRESH PLUS) 0.5 % SOLN Place 1 drop into both eyes 3 (three) times daily as needed (for dry eyes).   . Cholecalciferol (VITAMIN D3) 1000 UNITS CAPS Take by mouth daily.   . cloNIDine (CATAPRES) 0.1 MG tablet Take 1 tablet (0.1 mg) by mouth two times a day. (Patient taking differently: Take 1/2 tablet (0.1 mg) in AM and 1 tablet by mouth in the evening. Takes additional half-tablet in the middle of the night.)  . losartan (COZAAR) 100 MG tablet Take 1 tablet (100 mg total) by mouth daily.  . metFORMIN (GLUCOPHAGE) 500 MG tablet Take 1 tablet (500 mg total) by mouth 2 (two) times daily with a meal. (Patient taking differently: Take 500 mg by mouth daily. Patient takes 1/2 tablet by mouth BID)  . ONETOUCH DELICA LANCETS 76B MISC Use as directed. Dx 250.0  . PROBIOTIC PRODUCT PO Take by mouth daily.   . Red Yeast Rice Extract (RED YEAST RICE PO) Take 400 mg by mouth in the morning and at bedtime.  . TURMERIC PO Take by mouth daily.   Marland Kitchen Ubiquinol (QUNOL COQ10/UBIQUINOL/MEGA) 100 MG CAPS Take by mouth daily.   Marland Kitchen zinc gluconate 50 MG tablet Take 50 mg by mouth daily.     Allergies:   Buprenorphine hcl, Chlorphen-diphenhyd-pe-apap, Codeine, Mold extract [trichophyton mentagrophyte], Morphine and related, Oxycodone, Oxycodone hcl, Oxycodone hcl, Sumycin [tetracycline hcl], Cardura [doxazosin mesylate], Chlorphen-phenyleph-asa, Decongest-aid [pseudoephedrine], Esomeprazole magnesium, Esomeprazole magnesium, Hydralazine, Hydrochlorothiazide, Indomethacin, Procaine hcl, Keflex [cephalexin], Latex, Neomycin-bacitracin zn-polymyx, Pseudoephedrine hcl er,  Sudafed pe cold & cough child  [phenylephrine-dm], Telithromycin, and Tetracycline   Social History   Tobacco Use  . Smoking status: Never Smoker  . Smokeless tobacco: Never Used  . Tobacco comment: quit in 1978  Substance Use Topics  . Alcohol use: No    Alcohol/week: 0.0 standard drinks  . Drug use: No     Current Outpatient Medications on File Prior to Visit  Medication Sig Dispense Refill  . Artificial Tear Ointment (REFRESH LACRI-LUBE) OINT Apply 1 application to eye at bedtime.    . Ascorbic Acid (VITAMIN C PO) Take by mouth daily.     . B-D ULTRA-FINE 33 LANCETS MISC Use as directed. Dx 250.0    . Blood Glucose Monitoring Suppl (ONE TOUCH ULTRA MINI) W/DEVICE KIT USE  AS DIRECTED 1 each 0  . BYSTOLIC 10 MG tablet TAKE 1 TABLET BY MOUTH EVERY DAY 90 tablet 2  . carboxymethylcellulose (REFRESH PLUS) 0.5 % SOLN Place 1 drop into both eyes 3 (three) times daily as needed (for dry eyes).     . Cholecalciferol (VITAMIN D3) 1000 UNITS CAPS Take by mouth daily.     . cloNIDine (CATAPRES) 0.1 MG tablet Take 1 tablet (0.1 mg) by mouth two times a day. (Patient taking differently: Take 1/2 tablet (0.1 mg) in AM and 1 tablet by mouth in the evening. Takes additional half-tablet in the middle of the night.) 180 tablet 3  . losartan (COZAAR) 100 MG tablet Take 1 tablet (100 mg total) by mouth daily. 90 tablet 4  . metFORMIN (GLUCOPHAGE) 500 MG tablet Take 1 tablet (500 mg total) by mouth 2 (two) times daily with a meal. (Patient taking differently: Take 500 mg by mouth daily. Patient takes 1/2 tablet by mouth BID) 60 tablet 3  . ONETOUCH DELICA LANCETS 53G MISC Use as directed. Dx 250.0 100 each 3  . PROBIOTIC PRODUCT PO Take by mouth daily.     . Red Yeast Rice Extract (RED YEAST RICE PO) Take 400 mg by mouth in the morning and at bedtime.    . TURMERIC PO Take by mouth daily.     Marland Kitchen Ubiquinol (QUNOL COQ10/UBIQUINOL/MEGA) 100 MG CAPS Take by mouth daily.     Marland Kitchen zinc gluconate 50 MG tablet Take  50 mg by mouth daily.    Marland Kitchen amoxicillin (AMOXIL) 500 MG capsule Take 1 capsule (500 mg total) by mouth 3 (three) times daily. (Patient not taking: Reported on 02/06/2019) 30 capsule 0   No current facility-administered medications on file prior to visit.     Family Hx: The patient's family history includes Alcohol abuse in her father; Aneurysm in her mother; Arthritis in her mother; Breast cancer in her mother; Cancer in her mother; Early death in her father; Heart disease in her mother; Hyperlipidemia in her mother; Hypertension in her mother; Stroke in her mother.  ROS:   Please see the history of present illness.    Review of Systems  Constitutional: Negative.   HENT: Negative.   Respiratory: Negative.   Cardiovascular: Negative.   Gastrointestinal: Negative.   Musculoskeletal: Negative.   Neurological: Negative.   Psychiatric/Behavioral: Negative.   All other systems reviewed and are negative.    Labs/Other Tests and Data Reviewed:    Recent Labs: No results found for requested labs within last 8760 hours.   Recent Lipid Panel Lab Results  Component Value Date/Time   CHOL 126 03/03/2015 08:39 AM   TRIG 62 03/03/2015 08:39 AM   HDL 47 03/03/2015 08:39 AM   CHOLHDL 2.7 03/03/2015 08:39 AM   CHOLHDL 6 01/15/2014 08:17 AM   LDLCALC 67 03/03/2015 08:39 AM   LDLDIRECT 156.6 05/17/2012 03:44 PM    Wt Readings from Last 3 Encounters:  05/01/19 154 lb 2 oz (69.9 kg)  02/06/19 153 lb 4 oz (69.5 kg)  02/02/18 150 lb (68 kg)     Exam:    Vital Signs: Vital signs may also be detailed in the HPI BP (!) 150/70 (BP Location: Left Arm, Patient Position: Sitting, Cuff Size: Normal)   Pulse (!) 56   Ht 5' (1.524 m)   Wt 154 lb 2 oz (69.9 kg)   SpO2 96%   BMI 30.10 kg/m    Constitutional:  oriented to person, place, and time. No  distress.  HENT:  Head: Grossly normal Eyes:  no discharge. No scleral icterus.  Neck: No JVD, no carotid bruits  Cardiovascular: Regular rate and  rhythm, no murmurs appreciated Pulmonary/Chest: Clear to auscultation bilaterally, no wheezes or rails Abdominal: Soft.  no distension.  no tenderness.  Musculoskeletal: Normal range of motion Neurological:  normal muscle tone. Coordination normal. No atrophy Skin: Skin warm and dry Psychiatric: normal affect, pleasant   ASSESSMENT & PLAN:    Bilateral carotid artery stenosis - Plan: EKG 12-Lead Does not want a statin,  doing red yeast rice We have ordered a carotid ultrasound at her request  Hypertension goal BP (blood pressure) < 140/90 -  continue losartan, clonidine, Bystolic Recommended she stay on her current medications as detailed above, add additional half clonidine right before she goes to bed We may need additional half clonidine at lunchtime Fluctuating numbers may be exacerbated by stressors at home  Hyperlipidemia LDL goal <100 - Plan: EKG 12-Lead Previously took herself off Crestor, on red yeast rice  OSA on CPAP - Plan: EKG 12-Lead Managed by [facility] Chronic poor sleep hygiene Wears a mask, pushes on her face , causing tempral pain  Temporal pain Less symptoms on today's visit  Anxiety Stable Followed by primary care Stressors at home Recommended walking, stress reduction techniques   Total encounter time more than 25 minutes  Greater than 50% was spent in counseling and coordination of care with the patient   Disposition: Follow-up in 12 months   Signed, Ida Rogue, MD  05/01/2019 3:51 PM    [place] [facility] [address], [place], [place] [address]

## 2019-05-01 NOTE — Patient Instructions (Addendum)
Medication Instructions:  Please take extra clonidine 1/2 pill for lunch and before bed as needed for high pressure  If you need a refill on your cardiac medications before your next appointment, please call your pharmacy.   Medication Samples have been provided to the patient.  Drug name: Bystolic       Strength: 10 mg        Qty: 4 boxes  LOT: [id number]  Exp.Date: 02/22   Lab work: No new labs needed   If you have labs (blood work) drawn today and your tests are completely normal, you will receive your results only by: Marland Kitchen MyChart Message (if you have MyChart) OR . A paper copy in the mail If you have any lab test that is abnormal or we need to change your treatment, we will call you to review the results.   Testing/Procedures: Carotid ultrasound for stenosis on the left Your physician has requested that you have a carotid duplex. This test is an ultrasound of the carotid arteries in your neck. It looks at blood flow through these arteries that supply the brain with blood. Allow one hour for this exam. There are no restrictions or special instructions.    Follow-Up: At [facility], you and your health needs are our priority.  As part of our continuing mission to provide you with exceptional heart care, we have created designated Provider Care Teams.  These Care Teams include your primary Cardiologist (physician) and Advanced Practice Providers (APPs -  Physician Assistants and Nurse Practitioners) who all work together to provide you with the care you need, when you need it.  . You will need a follow up appointment in 12 months   . Providers on your designated Care Team:   . Murray Hodgkins, NP . Christell Faith, PA-C . Marrianne Mood, PA-C  Any Other Special Instructions Will Be Listed Below (If Applicable).  For educational health videos Log in to : [email or web address] Or : [email or web address], password : [email or web address]

## 2019-05-03 ENCOUNTER — Telehealth: Payer: Self-pay | Admitting: Neurology

## 2019-05-03 NOTE — Telephone Encounter (Signed)
Pt called needing to speak to RN regarding her trouble breathing even with the use of the cpap. Please advise.

## 2019-05-03 NOTE — Telephone Encounter (Signed)
Called the pt back and she states that she is really struggling with her machine and doesn't feel the apnea is being treated properly. She states that she knows she is having difficulty with sleep apnea still occurring. She complains of feeling so tired during the day. She is asking if anything can be done advised the patient that last time we saw her was in 2018. Informed her that in order to be able to prescribe anything we would need to be able to see her yearly that way we can check on status of her CPAP use as well as address sleep related concerns and place orders for her. Pt verbalized understanding. Pt agreed to a apt on Monday at 2 pm with check in of 1:30 pm. Pt was asked to bring her machine with her because I don't seem to have access to her download and I wanted to make sure that we could get that. Pt verbalized understanding and was appreciative for the call back.

## 2019-05-07 ENCOUNTER — Encounter: Payer: Self-pay | Admitting: Neurology

## 2019-05-07 ENCOUNTER — Ambulatory Visit (INDEPENDENT_AMBULATORY_CARE_PROVIDER_SITE_OTHER): Payer: PPO | Admitting: Neurology

## 2019-05-07 ENCOUNTER — Other Ambulatory Visit: Payer: Self-pay

## 2019-05-07 VITALS — BP 132/56 | HR 52 | Temp 97.3°F | Ht 60.0 in | Wt 153.5 lb

## 2019-05-07 DIAGNOSIS — R6889 Other general symptoms and signs: Secondary | ICD-10-CM | POA: Diagnosis not present

## 2019-05-07 DIAGNOSIS — J310 Chronic rhinitis: Secondary | ICD-10-CM | POA: Diagnosis not present

## 2019-05-07 DIAGNOSIS — Z9989 Dependence on other enabling machines and devices: Secondary | ICD-10-CM | POA: Diagnosis not present

## 2019-05-07 DIAGNOSIS — G4733 Obstructive sleep apnea (adult) (pediatric): Secondary | ICD-10-CM

## 2019-05-07 DIAGNOSIS — F458 Other somatoform disorders: Secondary | ICD-10-CM | POA: Diagnosis not present

## 2019-05-07 DIAGNOSIS — F331 Major depressive disorder, recurrent, moderate: Secondary | ICD-10-CM

## 2019-05-07 NOTE — Progress Notes (Signed)
SLEEP MEDICINE CLINIC   Provider:  Larey Davidson, [place] D  Primary Care Physician:  Yvonne Body, MD   Referring Provider: Dion Body, MD    Chief Complaint  Patient presents with  . Follow-up    Room 10. Sleep Apnea follow up. Reports having difficulty w/ CPAP and increased fatigue. She forgot to bring her machine with her today.      Yvonne Davidson is a 79 y.o. female , seen here in a revisit on 05-07-2019 after a almost 3 year period of not following up .  She is reporting low exercise tolerance, vigor. BP is elevated.   She reports she stops breathing even on CPAP - and forgot to bring [place] her machine.  The patient endorsed a high level of fatigue today the maximum of 63 points, she endorsed 10 points on the geriatric depression score, 20 points on the Epworth sleepiness score out of 24 possible points.  The last encounter I had with the patient was related to a follow-up on a PSG with CPAP titration meant to be followed by an MSLT to evaluate narcolepsy.  The patient had a excessive daytime sleepiness history and had previously been a patient of Yvonne Davidson who was not concerned that she may have narcolepsy.  She had previously also a sleep evaluation with Yvonne Davidson who is still practicing.  She started on CPAP had aerophagia at the time, her PSG with me here showed an AHI during use of CPAP at only 0.3/h she was very well controlled at 8 and 9 cm water pressure.  MSLT was also normal 2 naps without any sleep, 3 naps without REM sleep onset.  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  I reviewed the patient's allergies today there is a very long list, her current list of medication is not as long and only 2 medications may actually be sleepiness causing : 1) Bystolic,2) Catapres. She sleeps in a recliner, she naps every day. She is very irritable and has no physiological cause  for it.  Someone also told her she may have temporal arteritis. She suspected she has acid reflux, was told to get Prilosec OTC medication.   10-30-2016- revisit  from Yvonne Davidson for a re-evaluation of her sleep. 11-09-2016, Patient telling me all about radioactive emissions from her smart electricity meter. She feels it interferes with her sleep. I will review today the patient's polysomnography from 08/18/2016, the patient had a phobia CPAP, but she tolerated during the sleep study a CPAP pressure of 9 cm very well with no residual apneas being found. The study was valid for M SLT to follow. The preceding polysomnography had a total sleep time of 418 recorded minutes. She did not have REM sleep onset, her mean sleep latency was well over 10 minutes, and 2 nap opportunities of the 5 nap MSLT did not contain any sleep at all.  Narcolepsy could not be diagnosed by the gold standard test.  I also reviewed her cardiologist's note, Yvonne Davidson, indicated that she is followed for her OSA at [facility], has chronic pain in her legs GERD, hyperlipidemia hypertension and diabetes mellitus and is followed by him for blood pressure and carotid disease. She had seen the emergency room for hypertension on 10/21/2016. She has just below 80% carotid disease on the left and below 60% disease on the right. She is taking clonidine or 0.1  mg up to 4 times daily. She indicated that she wears a mouth piece design but [facility] dentistry for chronic sleep apnea but continues to wear CPAP with a nasal pillow and a chinstrap.  States: She is "crippled" in her life by poor sleep.  I reviewed her list of medications. I cannot find an explanation for her repeated describes sleep attacks, dream intrusions, vivid and lucid dreams, and have to resort to psychological- psychiatric explanation.      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 She struggles with  significant fatigue and excessive daytime sleepiness endorsing the Epworth sleepiness score at 20 points. She had undergone an evaluation with Yvonne Davidson, was now concerned that she may have narcolepsy. She endorsed insomnia, sleepiness, snoring, restless legs, fatigue, palpitations, hearing loss, allergic rhinitis, decreased energy and the feeling of never getting enough sleep. She sleeps with her head of bed elevated- she tries to sleep actually in a seated position to allow her to breathe deeper. She would describes that she gulps air swallows air, she was evaluated for sleep apnea and has been compliant with CPAP use. She was diagnosed with sleep apnea first in the year 2000 after she had been told that she was a loud snorer. She was referred to Yvonne Davidson he did a polysomnography at the time and she was told that her obstructive sleep apnea may be in moderate range. She started on CPAP but she had aerophagia. This continued to be a problem in spite of trying CPAP repeatedly and was different interfaces.  She has tried an oral appliance - actually two. None of them treated the snoring sufficiently and none of them gave her energy or refreshing restorative sleep.  She no longer sleeps with her husband due to her snoring. Whenever she is physically inactive and mentally not stimulated, she will go to sleep. She can recall being asleep within seconds even when she was young, very slender and physically very active.  Her sleep at the time was of good quality and she would usually wake up refreshed but she craves naps even than.She would have dream hallucinations and dream intrusions hypnagogic or hypnopompic, sleep paralysis. 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 Her dreams are vivid and may continue and several installments after she wakes up. The most scary dreams all those when she feels the  presence of another being in the room but cannot move to get away. These have affected her all her life.  She struggles with depression, hypertension and diabetes she is status post sinus surgery in 1991 and 2006 and had an abdominal cholecystectomy in 2015.  Sleep habits are as follows: Usually she is in bed by 10 PM the latest at 11. She sleeps in a recliner not in bed. She has not been gulping air while on CPAP when seated. She can usually sleep 4-5 hours. She feels that her breathing stops not just at night but also in the middle of the day. She has been scared to sleep. She tends to hold her breath when she concentrates on something. She can go to sleep fast but she does not stay asleep she keeps waking up in intervals is rarely asleep on block for longer than 2 hours, she wakes up about every 90 minutes or so. She has 2  bathroom breaks on average each night she rises in the morning at 8 AM and she keeps this is a regular rise time in spite of not having had the same sleep at night. Naps every day 30 minutes.   Sleep medical history and family sleep history:  She is not aware of any sleep disorders affecting her siblings or parents, her father drank and snored-  . She underwent 2 sinus surgeries, she did not undergo tonsillectomy. She has macroglossia. She was a sleep walker.  She has been diagnosed with obstructive sleep apnea for the first time in the year 2000 and then has been worked up by Dr. Nehemiah Settle again. She was for a while followed at [facility]. 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 The overall AHI was 14.6 but there was loud snoring associated. There was no Cheyne-Stokes breathing noted she had an equal amount of obstructive and central apneas. Oxygen nadir SPO2 was 87% her respiratory events were clearly worse in supine sleep  and she did have frequent periodic limb movements she had a normal sleep efficiency and normal REM sleep but sleep was fragmented with respiratory arousals. Her EKG remained in normal sinus rhythm. Dr. Radene Knee brought that the patient had previously failed to tolerate CPAP and was at that time not able to try CPAP again he referred for the dental appliance. Region used for oral appliance for a while while sleeping in a recliner she also used a chin strap and CPAP at the time, truly uncomfortable. She reached a compliance of 97% on CPAP with 7 hours and 17 minutes nocturnal use, the pressure was set at 7.5 cm with 2 cm EPR and the residual AHI was 0.8 which is a good result but the patient did not sleep. Yvonne Davidson then changed her to an AutoSet between 4 and 16 cm water with 2 cm EPR, the 95th percentile was 14 cm water, residual AHI was 1.7 without treatment emergent central apnea. She did not have major air leaks. 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   Social history:  Married, retired, non smoker- quit 45 years ago. ETOH - one beer a month. Caffeine -  Hot tea 2 cups a day.   Review of Systems: Out of a complete 14 system review, the patient complains of only the following symptoms, and all other reviewed systems are negative. Snoring, dreams, cataplexy, sleep attacks, OSA. Palpitations.    Epworth score 20 , Fatigue severity score 59 , depression score 6/15   Social History   Socioeconomic History  . Marital status: Married    Spouse name: Not on file  . Number of children: 4  . Years of education: Not on file  . Highest education level: Not on file  Occupational History  . 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 Smoking status: Never Smoker  . Smokeless tobacco: Never Used  . Tobacco comment: quit in 1978  Substance and  Sexual Activity  . Alcohol use: No    Alcohol/week: 0.0 standard drinks  . Drug use: No  . Sexual activity: Not on file  Other Topics Concern  . Not on file  Social History Narrative  Lives with friend. Has 4 children.   Social Determinants of Health   Financial Resource Strain:   . Difficulty of Paying Living Expenses:   Food Insecurity:   . Worried About Charity fundraiser in the Last Year:   . Arboriculturist in the Last Year:   Transportation Needs:   . Film/video editor (Medical):   Marland Kitchen Lack of Transportation (Non-Medical):   Physical Activity:   . Days of Exercise per Week:   . Minutes of Exercise per Session:   Stress:   . Feeling of Stress :   Social Connections:   . Frequency of Communication with Friends and Family:   . Frequency of Social Gatherings with Friends and Family:   . Attends Religious Services:   . Active Member of Clubs or Organizations:   . Attends Archivist Meetings:   Marland Kitchen Marital Status:   Intimate Partner Violence:   . Fear of Current or Ex-Partner:   . Emotionally Abused:   Marland Kitchen Physically Abused:   . Sexually Abused:     Family History  Problem Relation Age of Onset  . Arthritis Mother   . Heart disease Mother   . Hyperlipidemia Mother   . Hypertension Mother   . Stroke Mother   . Cancer Mother        Breast & Uterine - 20'-30's  . Aneurysm Mother   . Breast cancer Mother   . Early death Father        Fire  . Alcohol abuse Father     Past Medical History:  Diagnosis Date  . Allergy   . Anemia   . Anxiety   . Arthritis   . Bilateral cataracts    immature  . Chronic sinusitis    takes Cetirizine daily  . Depression    doesn't take any meds  . Diabetes mellitus without complication (HCC)    borderline  . Diverticulosis   . Dry eyes   . GERD (gastroesophageal reflux disease)    TAKES OMEPRAZOLE DAILY  . Hashimoto's thyroiditis   . History of blood transfusion    in the 60's no abnormal reaction noted  .  History of echocardiogram    a. 05/2011: EF 60%, trace AI, trace MR, mild TR  . Hyperlipidemia    a. self discontinued statin  . Hypertension   . Insomnia    takes Trazodone nightly  . Medication intolerance   . OSA (obstructive sleep apnea)    doesn't use a cpap  . Osteoporosis    takes Vit D as needed  . PONV (postoperative nausea and vomiting)    hard to wake up    Past Surgical History:  Procedure Laterality Date  . CHOLECYSTECTOMY  12/07/2012   Dr Lucia Gaskins  . CHOLECYSTECTOMY N/A 12/07/2012   Procedure: LAPAROSCOPIC CHOLECYSTECTOMY WITH INTRAOPERATIVE CHOLANGIOGRAM;  Surgeon: Shann Medal, MD;  Location: [place];  Service: General;  Laterality: N/A;  . LIPOSUCTION    . NASAL SINUS SURGERY  2006   x 2  . right arm surgery  1960  . TCS      Current Outpatient Medications  Medication Sig Dispense Refill  . Artificial Tear Ointment (REFRESH LACRI-LUBE) OINT Apply 1 application to eye at bedtime.    . Ascorbic Acid (VITAMIN C PO) Take by mouth daily.     . B-D ULTRA-FINE 33 LANCETS MISC Use as directed. Dx 250.0    . Blood Glucose Monitoring Suppl (ONE TOUCH ULTRA MINI) W/DEVICE  KIT USE AS DIRECTED 1 each 0  . BYSTOLIC 10 MG tablet TAKE 1 TABLET BY MOUTH EVERY DAY 90 tablet 2  . carboxymethylcellulose (REFRESH PLUS) 0.5 % SOLN Place 1 drop into both eyes 3 (three) times daily as needed (for dry eyes).     . Cholecalciferol (VITAMIN D3) 1000 UNITS CAPS Take by mouth daily.     . cloNIDine (CATAPRES) 0.1 MG tablet Take 1 tablet (0.1 mg) by mouth two times a day. (Patient taking differently: Take 1/2 tablet (0.1 mg) in AM and 1 tablet by mouth in the evening. Takes additional half-tablet in the middle of the night.) 180 tablet 3  . losartan (COZAAR) 100 MG tablet Take 1 tablet (100 mg total) by mouth daily. 90 tablet 4  . metFORMIN (GLUCOPHAGE) 500 MG tablet Take 1 tablet (500 mg total) by mouth 2 (two) times daily with a meal. (Patient taking differently: Take 500 mg by mouth daily.  Patient takes 1/2 tablet by mouth BID) 60 tablet 3  . ONETOUCH DELICA LANCETS 69S MISC Use as directed. Dx 250.0 100 each 3  . PROBIOTIC PRODUCT PO Take by mouth daily.     . Red Yeast Rice Extract (RED YEAST RICE PO) Take 400 mg by mouth in the morning and at bedtime.    . TURMERIC PO Take by mouth daily.     Marland Kitchen Ubiquinol (QUNOL COQ10/UBIQUINOL/MEGA) 100 MG CAPS Take by mouth daily.     Marland Kitchen zinc gluconate 50 MG tablet Take 50 mg by mouth daily.     No current facility-administered medications for this visit.    Allergies as of 05/07/2019 - Review Complete 05/07/2019  Allergen Reaction Noted  . Buprenorphine hcl Anaphylaxis 07/01/2014  . Chlorphen-diphenhyd-pe-apap  06/10/2014  . Codeine Shortness Of Breath   . Mold extract [trichophyton mentagrophyte] Shortness Of Breath 04/28/2011  . Morphine and related Anaphylaxis 12/12/2012  . Oxycodone Shortness Of Breath 07/01/2014  . Oxycodone hcl Anaphylaxis   . Oxycodone hcl Anaphylaxis 07/01/2014  . Sumycin [tetracycline hcl] Anaphylaxis 04/28/2011  . Cardura [doxazosin mesylate]  10/26/2016  . Chlorphen-phenyleph-asa  07/01/2014  . Decongest-aid [pseudoephedrine]  03/14/2013  . Esomeprazole magnesium Other (See Comments)   . Esomeprazole magnesium  07/01/2014  . Hydralazine  10/26/2016  . Hydrochlorothiazide Other (See Comments) 04/28/2011  . Indomethacin Other (See Comments)   . Procaine hcl Other (See Comments) 04/28/2011  . Keflex [cephalexin] Rash 05/17/2012  . Latex Rash 11/29/2012  . Neomycin-bacitracin zn-polymyx Rash 04/28/2011  . Pseudoephedrine hcl er Palpitations 04/28/2011  . Sudafed pe cold & cough child  [phenylephrine-dm] Palpitations 07/01/2014  . Telithromycin Rash   . Tetracycline Rash     Vitals: BP (!) 132/56   Pulse (!) 52   Temp (!) 97.3 F (36.3 C)   Ht 5' (1.524 m)   Wt 153 lb 8 oz (69.6 kg)   BMI 29.98 kg/m  Last Weight:  Wt Readings from Last 1 Encounters:  05/07/19 153 lb 8 oz (69.6 kg)    WNI:OEVO mass index is 29.98 kg/m.     Last Height:   Ht Readings from Last 1 Encounters:  05/07/19 5' (1.524 m)    Physical exam:  General: The patient is awake, alert and appears not in acute distress. The patient is well groomed. Head: Normocephalic, atraumatic. Neck is supple. Mallampati 5 with macroglossia. ,  neck circumference:15. 25 . Nasal airflow patent , TMJ-right click still  evident. Retrognathia is not seen.  Cardiovascular:  Regular rate and rhythm, without  murmurs or carotid bruit, and without distended neck veins. Respiratory: Lungs are clear to auscultation. Skin:  Without evidence of edema, or rash Trunk: BMI is 27 .  Neurologic exam :The patient is awake and alert, oriented to place and time.   Memory subjective described as intact. Attention span & concentration ability appears normal.  Speech is fluent,  without  dysarthria, dysphonia or aphasia.  Mood and affect are appropriate.  Cranial nerves: Pupils are equal and briskly reactive to light. No nystagmus, no diplopia or amblyopia.    Facial sensation intact to fine touch.  Facial pain is actually a temple pain- soreness, " cutting in " Facial motor strength is symmetric and tongue and uvula move midline. Shoulder shrug was symmetrical.   Motor exam:   Normal tone, muscle bulk and symmetric strength in all extremities. Deep tendon reflexes: in the  upper and lower extremities are symmetric t. Babinski maneuver response was deferred.   Assessment:  After physical and neurologic examination, review of laboratory studies,  Personal review of imaging studies, reports of other /same  Imaging studies, results of polysomnography and / or neurophysiology testing and pre-existing records as far as provided in visit., my assessment is:  The patient does not need to see [place] if she doesn't bring her CPAP with her. I also defer to PCP for referral to COUNSELING - there is mood disorder (!!!!).  She also wanted to discuss  [employer] and I see no qualification- she may have central and OSA - she does want another mask, too . May be qualified for new machine.   1) OSA: the patient has been diagnosed with sleep apnea a few years ago by another sleep specialist - has seen 3 thus far- , reports she had  a long [place]sey to finally get the apnea under control and learn to accept CPAP. She was doing well  numerically on the current CPAP 9 cm water.  Reports aerophagia and pressure marks.  Dental device from [facility] alone didn't work, she needed a chinstrap for while she is now using a full face mask and her apnea is alleviated.   She still has problems not sleeping well at night ( subjective) , described vivid dreams that are sometimes so intrusive that she has difficulties differentiating reality from dream. Some of her dream hallucinations before paralyzed, and when she takes a deep loud laughter she may find herself weak and  her knees will buckle. This is why we ordered a narcolepsy evaluation by HLA and MSLT.  All work up returned negative!   I cannot find an explanation for her hypertension in her sleep apnea- but it may be her insomnia - psychological state. She should see a cognitive behavioral therapist for insomnia.   I spent more than 38 minutes of face to face time with the patient. Greater than 90% of time was spent in counseling and coordination of care. We have discussed the diagnosis and differential and I answered the patient's questions.    Plan:  Treatment plan and additional workup : referral back to PCP and recommendation to see psychologist/ behavioral therapist.  She has still no psychologist - I have asked her to get a PCP to order her consultation- or I  will not follow her for all the mood related sleep disturbances. - she can follow for CPAP only.   I will follow for OSA only, she wants a full face mask by dream wear , not sure how much this will  costs.  I asked her to buy [facility] at [facility].  I  ordered a repeat sleep study to see if central apnea is present. -  And we then can order a new PAP, fitting for new mask.   Yvonne Seat, MD 5/84/8350, 7:57 PM  Certified in Neurology by ABPN Certified in [place] by [facility] [address] [place], [place] [address]

## 2019-05-07 NOTE — Patient Instructions (Signed)

## 2019-05-14 ENCOUNTER — Other Ambulatory Visit: Payer: Self-pay

## 2019-05-14 ENCOUNTER — Other Ambulatory Visit: Payer: Self-pay | Admitting: Cardiovascular Disease

## 2019-05-14 ENCOUNTER — Ambulatory Visit (INDEPENDENT_AMBULATORY_CARE_PROVIDER_SITE_OTHER): Payer: PPO

## 2019-05-14 DIAGNOSIS — I6523 Occlusion and stenosis of bilateral carotid arteries: Secondary | ICD-10-CM | POA: Diagnosis not present

## 2019-05-16 ENCOUNTER — Ambulatory Visit (INDEPENDENT_AMBULATORY_CARE_PROVIDER_SITE_OTHER): Payer: PPO | Admitting: Neurology

## 2019-05-16 DIAGNOSIS — G4710 Hypersomnia, unspecified: Secondary | ICD-10-CM

## 2019-05-16 DIAGNOSIS — F458 Other somatoform disorders: Secondary | ICD-10-CM

## 2019-05-16 DIAGNOSIS — R5383 Other fatigue: Secondary | ICD-10-CM

## 2019-05-16 DIAGNOSIS — R6889 Other general symptoms and signs: Secondary | ICD-10-CM

## 2019-05-22 DIAGNOSIS — F458 Other somatoform disorders: Secondary | ICD-10-CM | POA: Insufficient documentation

## 2019-05-22 DIAGNOSIS — R5383 Other fatigue: Secondary | ICD-10-CM | POA: Insufficient documentation

## 2019-05-22 DIAGNOSIS — G4710 Hypersomnia, unspecified: Secondary | ICD-10-CM | POA: Insufficient documentation

## 2019-05-22 NOTE — Addendum Note (Signed)
Addended by: Larey Seat on: 05/22/2019 05:20 PM   Modules accepted: Orders

## 2019-05-22 NOTE — Progress Notes (Signed)
Summary & Diagnosis:   Mild OSA at AHI of 8.1/h and REM sleep AHI of 12.8/h. No oxygen  desaturation of clinical significance, with a trend to  bradycardia and some snoring. No central apnea.   PS: The patient advised me that she had a tooth ache and GERD the  night of the HST and that her BP was 176/57 mmHg in AM. She  indicated that she prefers to use CPAP with a FFM, a DreamWear  model.   Recommendations:    Mild apnea with a 50% increase in AHI during REM sleep, and no  associated hypoxemia - The patient can use CPAP or a dental  device, as the NREM sleep AHI of 7.7/h could be sufficiently  treated to reduce the overall AHI.  Her sleep disorder is unlikely to be based only on mild OSA.  HLA narcolepsy test was negative.  As she likes to try CPAP, I will order an autotitration device of  5-10 cm water with no EPR and mask of patient's choice. RV with  NP for sleep apnea only.   Interpreting Physician: Larey Seat, MD

## 2019-05-22 NOTE — Procedures (Signed)
Patient Information     First Name: Yvonne Last Name: Davidson ID: [id number]  Birth Date: [birth year] Age: 79 Gender: Female  Referring Provider: Dion Body, MD BMI: 29.9 (W=152 lb, H=5' 0'')  Neck Circ.:  15 '' Epworth:  20/24   Sleep Study Information    Study Date: May 16, 2019 S/H/A Version: 001.001.001.001 / 4.1.1528 / 67  History:    Yvonne Davidson is a 79 y.o. female who has longstanding idiopathic hypersomnia.   She is reporting low exercise tolerance, loss of vigor. Her BP is elevated.  She reports she stops breathing even while on CPAP - and forgot to bring her machine to this sleep clinic appointment. Epworth score is very high 20/24 p.  The patient endorsed a high level of fatigue today the maximum of 63 points, she endorsed 10 points on the geriatric depression score.  The last encounter I had with the patient was related to a follow-up on a PSG with CPAP titration.  The patient had a chronic excessive daytime sleepiness history and had previously been a patient of Dr. Maxwell Caul.    She started on CPAP, reportedly developed aerophagia at the time, her PSG here showed an AHI during use of CPAP at only 0.3/h =she was very well controlled at 8 and 9 cm water pressure.  MSLT was also normal 2 naps without any sleep, 3 naps without REM sleep onset.  Her fatigue and excessive daytime sleepiness is based on an idiopathic hypersomnia in consistency with the above-named GDS.  She sleeps in a recliner; she naps every day. She is very irritable and has no physiological cause for it. She has been in major depression.   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 No oxygen desaturation of clinical significance, with a trend to bradycardia and some snoring. No central apnea.   PS: The patient advised me that she had a tooth ache and GERD the night of the HST and that her BP was 176/57 mmHg in AM. She indicated that she prefers to use CPAP with a FFM, a DreamWear  model.   Recommendations:     Mild apnea with a 50% increase in AHI during REM sleep, and no associated hypoxemia - The patient can use CPAP or a dental device, as the NREM sleep AHI of 7.7/h could be sufficiently treated to reduce the overall AHI.  Her sleep disorder is unlikely to be based only on mild OSA.  As she likes to try CPAP, I will order an autotitration device of 5-10 cm water with no EPR and mask of patient's choice. RV with NP for sleep apnea only.   Interpreting Physician: Larey Seat, MD             Sleep Summary    Oxygen Saturation Statistics     Start Study Time: End Study Time: Total Recording Time:  10:12:55 PM 6:38:02 AM 8 h, 25 min  Total Sleep Time % REM of Sleep Time:  6 h, 33 min  8.4    Mean: 94 Minimum: 90 Maximum: 99  Mean of Desaturations Nadirs (%):   93  Oxygen Desaturation. %: 4-9 10-20 >20 Total  Events Number Total  11 100.0  0 0.0  0 0.0  11 100.0  Oxygen Saturation: <90 <=88 <85 <80 <70  Duration (minutes): Sleep % 0.0 0.0 0.0 0.0 0.0 0.0 0.0 0.0 0.0 0.0     Respiratory Indices  Total Events REM NREM All Night  pRDI:  58  pAHI:  53 ODI:  11  pAHIc:  2  % CSR: 0.0 16.4 12.8 1.8 1.8 8.2 7.7 1.7 0.2 8.9 8.1 1.7 0.3       Pulse Rate Statistics during Sleep (BPM)      Mean:  49 Minimum: 40  Maximum: 66    Indices are calculated using technically valid sleep time of 6 h, 32 min. pRDI/pAHI are calculated using 02i desaturations ? 3%  Body Position Statistics  Position Supine Prone Right Left Non-Supine  Sleep (min) 393.0 0.0 0.0 0.0 0.0  Sleep % 100.0 0.0 0.0 0.0 0.0  pRDI 8.9 N/A N/A N/A N/A  pAHI 8.1 N/A N/A N/A N/A  ODI 1.7 N/A N/A N/A N/A     Snoring Statistics Snoring Level (dB) >40 >50 >60 >70 >80 >Threshold (45)  Sleep (min) 29.7 3.6 1.2 0.0 0.0 4.8  Sleep % 7.6 0.9 0.3 0.0 0.0 1.2    Mean: 40 dB Sleep Stages Chart             pAHI=8.1                         Mild               Moderate                    Severe                                                 5              15                    30  * Reference values are according to AASM guidelines

## 2019-05-24 ENCOUNTER — Telehealth: Payer: Self-pay | Admitting: Neurology

## 2019-05-24 NOTE — Telephone Encounter (Signed)
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 No oxygen  desaturation of clinical significance, with a trend to  bradycardia and some snoring. No central apnea.   PS: The patient advised me that she had a tooth ache and GERD the  night of the HST and that her BP was 176/57 mmHg in AM. She  indicated that she prefers to use CPAP with a FFM, a DreamWear  model.   Recommendations:    Mild apnea with a 50% increase in AHI during REM sleep, and no  associated hypoxemia - The patient can use CPAP or a dental  device, as the NREM sleep AHI of 7.7/h could be sufficiently  treated to reduce the overall AHI.  Her sleep disorder is unlikely to be based only on mild OSA.  HLA narcolepsy test was negative.  As she likes to try CPAP, I will order an autotitration device of  5-10 cm water with no EPR and mask of patient's choice. RV with  NP for sleep apnea only.   Interpreting Physician: Larey Seat, MD

## 2019-05-24 NOTE — Telephone Encounter (Signed)
Called patient to discuss sleep study results. No answer at this time. LVM for the patient to call back.   

## 2019-05-28 NOTE — Telephone Encounter (Signed)
Pt returned call.  I advised pt that Dr. Brett Fairy reviewed their sleep study results and found that pt has mild sleep apnea. Dr. Brett Fairy recommends that pt starts auto CPAP 5-10 cm water pressure. Pt states that she has a machine but unsure what the current pressure is. Pt will bring the machine in today later around 3:153:30 to have me complete a DL. Once I have that information I can see if possible she is eligible for a new machine and transfer to a new DME company.

## 2019-05-29 ENCOUNTER — Other Ambulatory Visit: Payer: Self-pay | Admitting: Neurology

## 2019-05-29 DIAGNOSIS — G4733 Obstructive sleep apnea (adult) (pediatric): Secondary | ICD-10-CM

## 2019-05-29 DIAGNOSIS — Z9989 Dependence on other enabling machines and devices: Secondary | ICD-10-CM

## 2019-05-30 ENCOUNTER — Other Ambulatory Visit: Payer: Self-pay

## 2019-05-30 MED ORDER — CLONIDINE HCL 0.1 MG PO TABS: ORAL_TABLET | ORAL | 3 refills | Status: DC

## 2019-05-30 NOTE — Telephone Encounter (Signed)
Called the patient and advised her of [facility] stating that transfer of care should not be an issue. Advised that they should be in touch with her soon about setting up an apt for mask fitting. Pt verbalized understanding and was appreciative for the call.

## 2019-05-30 NOTE — Telephone Encounter (Signed)
*  STAT* If patient is at the pharmacy, call can be transferred to refill team.   1. Which medications need to be refilled? (please list name of each medication and dose if known) Clonidine  2. Which pharmacy/location (including street and city if local pharmacy) is medication to be sent to? [facility] [place]  3. Do they need a 30 day or 90 day supply? [place]

## 2019-05-31 ENCOUNTER — Encounter: Payer: Self-pay | Admitting: Neurology

## 2019-06-21 DIAGNOSIS — G4733 Obstructive sleep apnea (adult) (pediatric): Secondary | ICD-10-CM | POA: Diagnosis not present

## 2019-06-28 DIAGNOSIS — I10 Essential (primary) hypertension: Secondary | ICD-10-CM | POA: Diagnosis not present

## 2019-06-28 DIAGNOSIS — E785 Hyperlipidemia, unspecified: Secondary | ICD-10-CM | POA: Diagnosis not present

## 2019-06-28 DIAGNOSIS — E1169 Type 2 diabetes mellitus with other specified complication: Secondary | ICD-10-CM | POA: Diagnosis not present

## 2019-06-28 DIAGNOSIS — E782 Mixed hyperlipidemia: Secondary | ICD-10-CM | POA: Diagnosis not present

## 2019-06-29 DIAGNOSIS — E782 Mixed hyperlipidemia: Secondary | ICD-10-CM | POA: Diagnosis not present

## 2019-06-29 DIAGNOSIS — F329 Major depressive disorder, single episode, unspecified: Secondary | ICD-10-CM | POA: Diagnosis not present

## 2019-06-29 DIAGNOSIS — E785 Hyperlipidemia, unspecified: Secondary | ICD-10-CM | POA: Diagnosis not present

## 2019-06-29 DIAGNOSIS — I10 Essential (primary) hypertension: Secondary | ICD-10-CM | POA: Diagnosis not present

## 2019-06-29 DIAGNOSIS — E1169 Type 2 diabetes mellitus with other specified complication: Secondary | ICD-10-CM | POA: Diagnosis not present

## 2019-10-01 ENCOUNTER — Other Ambulatory Visit: Payer: Self-pay | Admitting: Cardiovascular Disease

## 2019-10-01 ENCOUNTER — Encounter: Payer: Self-pay | Admitting: Neurology

## 2019-10-02 MED ORDER — LOSARTAN POTASSIUM 100 MG PO TABS: 100.0000 mg | ORAL_TABLET | Freq: Every day | ORAL | 1 refills | Status: DC

## 2019-10-02 NOTE — Addendum Note (Signed)
Addended by: Raelene Bott, Godwin Tedesco L on: 10/02/2019 02:22 PM   Modules accepted: Orders

## 2019-10-02 NOTE — Telephone Encounter (Signed)
Losartan refill as requested.

## 2019-10-02 NOTE — Telephone Encounter (Signed)
°*  STAT* If patient is at the pharmacy, call can be transferred to refill team.   1. Which medications need to be refilled? (please list name of each medication and dose if known) losartan 100 MG 1 tablet daily   2. Which pharmacy/location (including street and city if local pharmacy) is medication to be sent to[facility] [place]  3. Do they need a 30 day or 90 day supply? 90 day

## 2019-10-04 ENCOUNTER — Other Ambulatory Visit: Payer: Self-pay

## 2019-10-04 ENCOUNTER — Ambulatory Visit: Payer: PPO | Admitting: Adult Health

## 2019-10-04 VITALS — BP 160/55 | HR 55 | Ht 62.0 in | Wt 152.0 lb

## 2019-10-04 DIAGNOSIS — G4733 Obstructive sleep apnea (adult) (pediatric): Secondary | ICD-10-CM

## 2019-10-04 DIAGNOSIS — Z9989 Dependence on other enabling machines and devices: Secondary | ICD-10-CM | POA: Diagnosis not present

## 2019-10-04 NOTE — Progress Notes (Signed)
PATIENT: Yvonne Davidson DOB: 1940/03/09  REASON FOR VISIT: follow up HISTORY FROM: patient  HISTORY OF PRESENT ILLNESS: Today 10/04/19:  Ms. Yvonne Davidson is a 79 year old female with a history of obstructive sleep apnea on CPAP.  Her download indicates that she used her machine nightly for compliance of 100%.  She used her machine greater than 4 hours each night.  On average she uses her machine 7 hours and 14 minutes.  Her residual AHI is 0.9 on 4 to 20 cm of water.  Her leak in the 95th percentile is 22 L/min.  The patient is insistent that something is not working right for her.  She states that she is still tired during the day.  Reports that she has "no life" due to fatigue.  She has had several sleep studies.  She does feel that her mask does not fit appropriately.  Narcolepsy has been ruled out.  She returns today for an evaluation.  HISTORY Yvonne Davidson is a 79 y.o. female , seen here in a revisit on 05-07-2019 after a almost 3 year period of not following up .  She is reporting low exercise tolerance, vigor. BP is elevated.   She reports she stops breathing even on CPAP - and forgot to bring [place] her machine.  The patient endorsed a high level of fatigue today the maximum of 63 points, she endorsed 10 points on the geriatric depression score, 20 points on the Epworth sleepiness score out of 24 possible points.  The last encounter I had with the patient was related to a follow-up on a PSG with CPAP titration meant to be followed by an MSLT to evaluate narcolepsy.  The patient had a excessive daytime sleepiness history and had previously been a patient of Dr. Elenore Rota who was not concerned that she may have narcolepsy.  She had previously also a sleep evaluation with Dr. Annamaria Boots who is still practicing.  She started on CPAP had aerophagia at the time, her PSG with me here showed an AHI during use of CPAP at only 0.3/h she was very well controlled at 8 and 9 cm water pressure.  MSLT was also  normal 2 naps without any sleep, 3 naps without REM sleep onset.  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  I reviewed the patient's allergies today there is a very long list, her current list of medication is not as long and only 2 medications may actually be sleepiness causing : 1) Bystolic,2) Catapres. She sleeps in a recliner, she naps every day. She is very irritable and has no physiological cause for it.  Someone also told her she may have temporal arteritis. She suspected she has acid reflux, was told to get Prilosec OTC medication.   REVIEW OF SYSTEMS: Out of a complete 14 system review of symptoms, the patient complains only of the following symptoms, and all other reviewed systems are negative.  ESS 19  ALLERGIES: Allergies  Allergen Reactions  . Buprenorphine Hcl Anaphylaxis    "Cant breath when I take it"  . Chlorphen-Diphenhyd-Pe-Apap     Other reaction(s): Other (See Comments) Shortness of breath  . Codeine Shortness Of Breath  . Mold Extract [Trichophyton Mentagrophyte] Shortness Of Breath  . Morphine And Related Anaphylaxis    "Cant breath when I take it"  . Oxycodone Shortness Of Breath  . Oxycodone Hcl Anaphylaxis    ALL NARCOTICS  .  Oxycodone Hcl Anaphylaxis    ALL NARCOTICS  . Sumycin [Tetracycline Hcl] Anaphylaxis  . Cardura [Doxazosin Mesylate]     Palpitations   . Chlorphen-Phenyleph-Asa     Other reaction(s): Other (See Comments) Shortness of breath  . Decongest-Aid [Pseudoephedrine]   . Esomeprazole Magnesium Other (See Comments)    Stomach issues  . Esomeprazole Magnesium     Other reaction(s): Other (See Comments) Stomach issues  . Hydralazine     Palpitations   . Hydrochlorothiazide Other (See Comments)    pancreatitis  . Indomethacin Other (See Comments)    disorientation  . Procaine Hcl Other (See Comments)    TACHYCARDIA   . Keflex  [Cephalexin] Rash    Unknown, allergic to a lot of antibiotics  . Latex Rash    Breaks out Breaks out  . Neomycin-Bacitracin Zn-Polymyx Rash  . Pseudoephedrine Hcl Er Palpitations  . Sudafed Pe Cold & Cough Child  [Phenylephrine-Dm] Palpitations  . Telithromycin Rash  . Tetracycline Rash    HOME MEDICATIONS: Outpatient Medications Prior to Visit  Medication Sig Dispense Refill  . Artificial Tear Ointment (REFRESH LACRI-LUBE) OINT Apply 1 application to eye at bedtime.    . Ascorbic Acid (VITAMIN C PO) Take by mouth daily.     . B-D ULTRA-FINE 33 LANCETS MISC Use as directed. Dx 250.0    . Blood Glucose Monitoring Suppl (ONE TOUCH ULTRA MINI) W/DEVICE KIT USE AS DIRECTED 1 each 0  . BYSTOLIC 10 MG tablet TAKE 1 TABLET BY MOUTH EVERY DAY 90 tablet 2  . carboxymethylcellulose (REFRESH PLUS) 0.5 % SOLN Place 1 drop into both eyes 3 (three) times daily as needed (for dry eyes).     . Cholecalciferol (VITAMIN D3) 1000 UNITS CAPS Take by mouth daily.     . cloNIDine (CATAPRES) 0.1 MG tablet Take 1/2 tablet (0.1 mg) in AM and 1 tablet by mouth in the evening. Takes additional half-tablet in the middle of the night. 180 tablet 3  . losartan (COZAAR) 100 MG tablet Take 1 tablet (100 mg total) by mouth daily. 90 tablet 1  . metFORMIN (GLUCOPHAGE) 500 MG tablet Take 1 tablet (500 mg total) by mouth 2 (two) times daily with a meal. (Patient taking differently: Take 500 mg by mouth daily. Patient takes 1/2 tablet by mouth BID) 60 tablet 3  . ONETOUCH DELICA LANCETS 49E MISC Use as directed. Dx 250.0 100 each 3  . PROBIOTIC PRODUCT PO Take by mouth daily.     . Red Yeast Rice Extract (RED YEAST RICE PO) Take 400 mg by mouth in the morning and at bedtime.    . TURMERIC PO Take by mouth daily.     Marland Kitchen Ubiquinol (QUNOL COQ10/UBIQUINOL/MEGA) 100 MG CAPS Take by mouth daily.     Marland Kitchen zinc gluconate 50 MG tablet Take 50 mg by mouth daily.     No facility-administered medications prior to visit.    PAST  MEDICAL HISTORY: Past Medical History:  Diagnosis Date  . Allergy   . Anemia   . Anxiety   . Arthritis   . Bilateral cataracts    immature  . Chronic sinusitis    takes Cetirizine daily  . Depression    doesn't take any meds  . Diabetes mellitus without complication (HCC)    borderline  . Diverticulosis   . Dry eyes   . GERD (gastroesophageal reflux disease)    TAKES OMEPRAZOLE DAILY  . Hashimoto's thyroiditis   . History of blood transfusion  in the 60's no abnormal reaction noted  . History of echocardiogram    a. 05/2011: EF 60%, trace AI, trace MR, mild TR  . Hyperlipidemia    a. self discontinued statin  . Hypertension   . Insomnia    takes Trazodone nightly  . Medication intolerance   . OSA (obstructive sleep apnea)    doesn't use a cpap  . Osteoporosis    takes Vit D as needed  . PONV (postoperative nausea and vomiting)    hard to wake up    PAST SURGICAL HISTORY: Past Surgical History:  Procedure Laterality Date  . CHOLECYSTECTOMY  12/07/2012   Dr Lucia Gaskins  . CHOLECYSTECTOMY N/A 12/07/2012   Procedure: LAPAROSCOPIC CHOLECYSTECTOMY WITH INTRAOPERATIVE CHOLANGIOGRAM;  Surgeon: Shann Medal, MD;  Location: [facility];  Service: General;  Laterality: N/A;  . LIPOSUCTION    . NASAL SINUS SURGERY  2006   x 2  . right arm surgery  1960  . TCS      FAMILY HISTORY: Family History  Problem Relation Age of Onset  . Arthritis Mother   . Heart disease Mother   . Hyperlipidemia Mother   . Hypertension Mother   . Stroke Mother   . Cancer Mother        Breast & Uterine - 20'-30's  . Aneurysm Mother   . Breast cancer Mother   . Early death Father        Fire  . Alcohol abuse Father     SOCIAL HISTORY: Social History   Socioeconomic History  . Marital status: Married    Spouse name: Not on file  . Number of children: 4  . Years of education: Not on file  . Highest education level: Not on file  Occupational History  . 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 Smoking status: Never Smoker  . Smokeless tobacco: Never Used  . Tobacco comment: quit in 1978  Vaping Use  . Vaping Use: Never used  Substance and Sexual Activity  . Alcohol use: No    Alcohol/week: 0.0 standard drinks  . Drug use: No  . Sexual activity: Not on file  Other Topics Concern  . Not on file  Social History Narrative   Lives with friend. Has 4 children.   Social Determinants of Health   Financial Resource Strain:   . Difficulty of Paying Living Expenses: Not on file  Food Insecurity:   . Worried About Charity fundraiser in the Last Year: Not on file  . Ran Out of Food in the Last Year: Not on file  Transportation Needs:   . Lack of Transportation (Medical): Not on file  . Lack of Transportation (Non-Medical): Not on file  Physical Activity:   . Days of Exercise per Week: Not on file  . Minutes of Exercise per Session: Not on file  Stress:   . Feeling of Stress : Not on file  Social Connections:   . Frequency of Communication with Friends and Family: Not on file  . Frequency of Social Gatherings with Friends and Family: Not on file  . Attends Religious Services: Not on file  . Active Member of Clubs or Organizations: Not on file  . Attends Archivist Meetings: Not on file  . Marital Status: Not on file  Intimate Partner Violence:   . Fear of Current or Ex-Partner: Not on file  . Emotionally Abused: Not on file  . Physically Abused: Not on file  .  Sexually Abused: Not on file      PHYSICAL EXAM  Vitals:   10/04/19 1055  BP: (!) 160/55  Pulse: (!) 55  Weight: 152 lb (68.9 kg)  Height: '5\' 2"'  (1.575 m)   Body mass index is 27.8 kg/m.  Generalized: Well developed, in no acute distress  Chest: Lungs clear to auscultation bilaterally  Neurological examination  Mentation: Alert oriented to time, place, history taking. Follows all commands speech and language fluent Cranial nerve II-XII: Extraocular movements were  full, visual field were full on confrontational test Head turning and shoulder shrug  were normal and symmetric. Motor: The motor testing reveals 5 over 5 strength of all 4 extremities. Good symmetric motor tone is noted throughout.  Sensory: Sensory testing is intact to soft touch on all 4 extremities. No evidence of extinction is noted.  Gait and station: Gait is normal.    DIAGNOSTIC DATA (LABS, IMAGING, TESTING) - I reviewed patient records, labs, notes, testing and imaging myself where available.  Lab Results  Component Value Date   WBC 7.9 10/25/2016   HGB 14.2 10/25/2016   HCT 41.3 10/25/2016   MCV 89.1 10/25/2016   PLT 237 10/25/2016      Component Value Date/Time   NA 139 10/25/2016 0811   NA 141 02/13/2015 0927   K 3.6 10/25/2016 0811   CL 105 10/25/2016 0811   CO2 23 10/25/2016 0811   GLUCOSE 172 (H) 10/25/2016 0811   BUN 15 10/25/2016 0811   BUN 13 02/13/2015 0927   CREATININE 0.77 10/25/2016 0811   CALCIUM 9.6 10/25/2016 0811   PROT 6.5 03/03/2015 0839   ALBUMIN 4.4 03/03/2015 0839   AST 15 03/03/2015 0839   ALT 16 03/03/2015 0839   ALKPHOS 61 03/03/2015 0839   BILITOT <0.2 03/03/2015 0839   GFRNONAA >60 10/25/2016 0811   GFRAA >60 10/25/2016 0811   Lab Results  Component Value Date   CHOL 126 03/03/2015   HDL 47 03/03/2015   LDLCALC 67 03/03/2015   LDLDIRECT 156.6 05/17/2012   TRIG 62 03/03/2015   CHOLHDL 2.7 03/03/2015   Lab Results  Component Value Date   HGBA1C 6.6 (H) 02/13/2015   Lab Results  Component Value Date   VITAMINB12 1,059 (H) 07/23/2013   Lab Results  Component Value Date   TSH 6.756 (H) 10/25/2016      ASSESSMENT AND PLAN 79 y.o. year old female  has a past medical history of Allergy, Anemia, Anxiety, Arthritis, Bilateral cataracts, Chronic sinusitis, Depression, Diabetes mellitus without complication ([place]), Diverticulosis, Dry eyes, GERD (gastroesophageal reflux disease), Hashimoto's thyroiditis, History of blood  transfusion, History of echocardiogram, Hyperlipidemia, Hypertension, Insomnia, Medication intolerance, OSA (obstructive sleep apnea), Osteoporosis, and PONV (postoperative nausea and vomiting). here with:  1. OSA on CPAP  - CPAP compliance excellent - Good treatment of AHI  -Mask refitting ordered -We discussed adding on medication for daytime sleepiness and fatigue such as Provigil however she states that she has a low sensitivity to medications and deferred for now.  -Also advised patient in regards to her inability to sleep well and feel well rested she may need to seek evaluation from psychiatry as anxiety may be playing a part in her symptoms? - Encourage patient to use CPAP nightly and > 4 hours each night - F/U in 6 months or sooner if needed   I spent 25 minutes of face-to-face and non-face-to-face time with patient.  This included previsit chart review, lab review, study review, order entry, electronic health  record documentation, patient education.  Ward Givens, MSN, NP-C 10/04/2019, 11:00 AM [facility] [address] [place], [place] [address] [phone]

## 2019-10-04 NOTE — Patient Instructions (Addendum)
Continue using CPAP nightly and greater than 4 hours each night Mask refitting Can consider Mask refitting If your symptoms worsen or you develop new symptoms please let us know.   Modafinil tablets What is this medicine? MODAFINIL (moe DAF i nil) is used to treat excessive sleepiness caused by certain sleep disorders. This includes narcolepsy, sleep apnea, and shift work sleep disorder. This medicine may be used for other purposes; ask your health care provider or pharmacist if you have questions. COMMON BRAND NAME(S): Provigil What should I tell my health care provider before I take this medicine? They need to know if you have any of these conditions:  history of depression, mania, or other mental disorder  kidney disease  liver disease  an unusual or allergic reaction to modafinil, other medicines, foods, dyes, or preservatives  pregnant or trying to get pregnant  breast-feeding How should I use this medicine? Take this medicine by mouth with a glass of water. Follow the directions on the prescription label. Take your doses at regular intervals. Do not take your medicine more often than directed. Do not stop taking except on your doctor's advice. A special MedGuide will be given to you by the pharmacist with each prescription and refill. Be sure to read this information carefully each time. Talk to your pediatrician regarding the use of this medicine in children. This medicine is not approved for use in children. Overdosage: If you think you have taken too much of this medicine contact a poison control center or emergency room at once. NOTE: This medicine is only for you. Do not share this medicine with others. What if I miss a dose? If you miss a dose, take it as soon as you can. If it is almost time for your next dose, take only that dose. Do not take double or extra doses. What may interact with this medicine? Do not take this medicine with any of the following  medications:  amphetamine or dextroamphetamine  dexmethylphenidate or methylphenidate  medicines called MAO Inhibitors like Nardil, Parnate, Marplan, Eldepryl  pemoline  procarbazine This medicine may also interact with the following medications:  antifungal medicines like itraconazole or ketoconazole  barbiturates like phenobarbital  birth control pills or other hormone-containing birth control devices or implants  carbamazepine  cyclosporine  diazepam  medicines for depression, anxiety, or psychotic disturbances  phenytoin  propranolol  triazolam  warfarin This list may not describe all possible interactions. Give your health care provider a list of all the medicines, herbs, non-prescription drugs, or dietary supplements you use. Also tell them if you smoke, drink alcohol, or use illegal drugs. Some items may interact with your medicine. What should I watch for while using this medicine? Visit your doctor or health care provider for regular checks on your progress. The full effects of this medicine may not be seen right away. This medicine may cause serious skin reactions. They can happen weeks to months after starting the medicine. Contact your health care provider right away if you notice fevers or flu-like symptoms with a rash. The rash may be red or purple and then turn into blisters or peeling of the skin. Or, you might notice a red rash with swelling of the face, lips or lymph nodes in your neck or under your arms. This medicine may affect your concentration, function, or may hide signs that you are tired. You may get dizzy. Do not drive, use machinery, or do anything that needs mental alertness until you know how this  drug affects you. Alcohol can make you more dizzy and may interfere with your response to this medicine or your alertness. Avoid alcoholic drinks. Birth control pills may not work properly while you are taking this medicine. Talk to your doctor about  using an extra method of birth control. It is unknown if the effects of this medicine will be increased by the use of caffeine. Caffeine is available in many foods, beverages, and medications. Ask your doctor if you should limit or change your intake of caffeine-containing products while on this medicine. What side effects may I notice from receiving this medicine? Side effects that you should report to your doctor or health care professional as soon as possible:  allergic reactions like skin rash, itching or hives, swelling of the face, lips, or tongue  anxiety  breathing problems  chest pain  fast, irregular heartbeat  hallucinations  increased blood pressure  rash, fever, and swollen lymph nodes  redness, blistering, peeling or loosening of the skin, including inside the mouth  sore throat, fever, or chills  suicidal thoughts or other mood changes  tremors  vomiting Side effects that usually do not require medical attention (report to your doctor or health care professional if they continue or are bothersome):  headache  nausea, diarrhea, or stomach upset  nervousness  trouble sleeping This list may not describe all possible side effects. Call your doctor for medical advice about side effects. You may report side effects to FDA at 1-800-FDA-1088. Where should I keep my medicine? Keep out of the reach of children. This medicine can be abused. Keep your medicine in a safe place to protect it from theft. Do not share this medicine with anyone. Selling or giving away this medicine is dangerous and against the law. This medicine may cause accidental overdose and death if taken by other adults, children, or pets. Mix any unused medicine with a substance like cat litter or coffee grounds. Then throw the medicine away in a sealed container like a sealed bag or a coffee can with a lid. Do not use the medicine after the expiration date. Store at room temperature between 20 and 25  degrees C (68 and 77 degrees F). NOTE: This sheet is a summary. It may not cover all possible information. If you have questions about this medicine, talk to your doctor, pharmacist, or health care provider.  2020 Elsevier/Gold Standard (2018-04-19 10:08:08)

## 2019-10-05 DIAGNOSIS — W57XXXA Bitten or stung by nonvenomous insect and other nonvenomous arthropods, initial encounter: Secondary | ICD-10-CM | POA: Diagnosis not present

## 2019-10-05 DIAGNOSIS — R21 Rash and other nonspecific skin eruption: Secondary | ICD-10-CM | POA: Diagnosis not present

## 2019-10-09 DIAGNOSIS — K529 Noninfective gastroenteritis and colitis, unspecified: Secondary | ICD-10-CM | POA: Diagnosis not present

## 2019-10-09 DIAGNOSIS — Z9989 Dependence on other enabling machines and devices: Secondary | ICD-10-CM | POA: Diagnosis not present

## 2019-10-09 DIAGNOSIS — R197 Diarrhea, unspecified: Secondary | ICD-10-CM | POA: Diagnosis not present

## 2019-10-09 DIAGNOSIS — Z8619 Personal history of other infectious and parasitic diseases: Secondary | ICD-10-CM | POA: Diagnosis not present

## 2019-10-09 DIAGNOSIS — E0500 Thyrotoxicosis with diffuse goiter without thyrotoxic crisis or storm: Secondary | ICD-10-CM | POA: Diagnosis not present

## 2019-10-09 DIAGNOSIS — Z9049 Acquired absence of other specified parts of digestive tract: Secondary | ICD-10-CM | POA: Diagnosis not present

## 2019-10-09 DIAGNOSIS — G4733 Obstructive sleep apnea (adult) (pediatric): Secondary | ICD-10-CM | POA: Diagnosis not present

## 2019-10-09 DIAGNOSIS — R152 Fecal urgency: Secondary | ICD-10-CM | POA: Diagnosis not present

## 2019-10-09 DIAGNOSIS — K219 Gastro-esophageal reflux disease without esophagitis: Secondary | ICD-10-CM | POA: Diagnosis not present

## 2019-10-10 ENCOUNTER — Telehealth: Payer: Self-pay | Admitting: Adult Health

## 2019-10-10 DIAGNOSIS — R152 Fecal urgency: Secondary | ICD-10-CM | POA: Diagnosis not present

## 2019-10-10 DIAGNOSIS — R197 Diarrhea, unspecified: Secondary | ICD-10-CM | POA: Diagnosis not present

## 2019-10-10 DIAGNOSIS — K529 Noninfective gastroenteritis and colitis, unspecified: Secondary | ICD-10-CM | POA: Diagnosis not present

## 2019-10-10 NOTE — Telephone Encounter (Signed)
Pt called, [facility] has not contacted Pt for mask fitting. Would like a call from the nurse.

## 2019-10-11 NOTE — Telephone Encounter (Signed)
LMVM for pt on her phone that did send another message to [facility] to let them know yo have order in the Epic system.  I had not heard back yet.

## 2019-10-11 NOTE — Telephone Encounter (Signed)
Jacquelyne Balint, RN You sent this yesterday as well, I'm processing it now! :)

## 2019-10-22 ENCOUNTER — Emergency Department
Admission: EM | Admit: 2019-10-22 | Discharge: 2019-10-22 | Disposition: A | Discharge status: Other Acute Inpatient Hospital | Payer: PPO

## 2019-10-22 ENCOUNTER — Emergency Department (HOSPITAL_COMMUNITY): Payer: PPO

## 2019-10-22 ENCOUNTER — Other Ambulatory Visit: Payer: Self-pay

## 2019-10-22 ENCOUNTER — Telehealth: Payer: Self-pay | Admitting: Cardiovascular Disease

## 2019-10-22 ENCOUNTER — Emergency Department (HOSPITAL_COMMUNITY)
Admission: EM | Admit: 2019-10-22 | Discharge: 2019-10-22 | Disposition: A | Discharge status: Home / Self Care | Payer: PPO | Attending: Emergency Medicine | Admitting: Emergency Medicine

## 2019-10-22 ENCOUNTER — Encounter (HOSPITAL_COMMUNITY): Payer: Self-pay | Admitting: Emergency Medicine

## 2019-10-22 DIAGNOSIS — I10 Essential (primary) hypertension: Secondary | ICD-10-CM | POA: Insufficient documentation

## 2019-10-22 DIAGNOSIS — E1169 Type 2 diabetes mellitus with other specified complication: Secondary | ICD-10-CM | POA: Diagnosis not present

## 2019-10-22 DIAGNOSIS — Z20822 Contact with and (suspected) exposure to covid-19: Secondary | ICD-10-CM | POA: Insufficient documentation

## 2019-10-22 DIAGNOSIS — E119 Type 2 diabetes mellitus without complications: Secondary | ICD-10-CM | POA: Diagnosis not present

## 2019-10-22 DIAGNOSIS — S065XAA Traumatic subdural hemorrhage with loss of consciousness status unknown, initial encounter: Secondary | ICD-10-CM

## 2019-10-22 DIAGNOSIS — J45909 Unspecified asthma, uncomplicated: Secondary | ICD-10-CM | POA: Diagnosis not present

## 2019-10-22 DIAGNOSIS — I6200 Nontraumatic subdural hemorrhage, unspecified: Secondary | ICD-10-CM | POA: Insufficient documentation

## 2019-10-22 DIAGNOSIS — Z7984 Long term (current) use of oral hypoglycemic drugs: Secondary | ICD-10-CM | POA: Diagnosis not present

## 2019-10-22 DIAGNOSIS — I6201 Nontraumatic acute subdural hemorrhage: Secondary | ICD-10-CM | POA: Diagnosis not present

## 2019-10-22 DIAGNOSIS — Z91040 Latex allergy status: Secondary | ICD-10-CM | POA: Diagnosis not present

## 2019-10-22 DIAGNOSIS — Z79899 Other long term (current) drug therapy: Secondary | ICD-10-CM | POA: Insufficient documentation

## 2019-10-22 DIAGNOSIS — R4781 Slurred speech: Secondary | ICD-10-CM | POA: Diagnosis not present

## 2019-10-22 DIAGNOSIS — R600 Localized edema: Secondary | ICD-10-CM | POA: Diagnosis not present

## 2019-10-22 DIAGNOSIS — S065X0A Traumatic subdural hemorrhage without loss of consciousness, initial encounter: Secondary | ICD-10-CM | POA: Diagnosis not present

## 2019-10-22 DIAGNOSIS — E782 Mixed hyperlipidemia: Secondary | ICD-10-CM | POA: Diagnosis not present

## 2019-10-22 DIAGNOSIS — F329 Major depressive disorder, single episode, unspecified: Secondary | ICD-10-CM | POA: Diagnosis not present

## 2019-10-22 LAB — PROTIME-INR
INR: 1 (ref 0.8–1.2)
Prothrombin Time: 12.2 seconds (ref 11.4–15.2)

## 2019-10-22 LAB — DIFFERENTIAL
Abs Immature Granulocytes: 0.03 10*3/uL (ref 0.00–0.07)
Basophils Absolute: 0 10*3/uL (ref 0.0–0.1)
Basophils Relative: 1 %
Eosinophils Absolute: 0.2 10*3/uL (ref 0.0–0.5)
Eosinophils Relative: 2 %
Immature Granulocytes: 0 %
Lymphocytes Relative: 25 %
Lymphs Abs: 2.2 10*3/uL (ref 0.7–4.0)
Monocytes Absolute: 0.8 10*3/uL (ref 0.1–1.0)
Monocytes Relative: 9 %
Neutro Abs: 5.6 10*3/uL (ref 1.7–7.7)
Neutrophils Relative %: 63 %

## 2019-10-22 LAB — COMPREHENSIVE METABOLIC PANEL
ALT: 22 U/L (ref 0–44)
AST: 20 U/L (ref 15–41)
Albumin: 4 g/dL (ref 3.5–5.0)
Alkaline Phosphatase: 75 U/L (ref 38–126)
Anion gap: 9 (ref 5–15)
BUN: 13 mg/dL (ref 8–23)
CO2: 26 mmol/L (ref 22–32)
Calcium: 9.3 mg/dL (ref 8.9–10.3)
Chloride: 103 mmol/L (ref 98–111)
Creatinine, Ser: 0.88 mg/dL (ref 0.44–1.00)
GFR calc Af Amer: >60 mL/min (ref >60)
GFR calc non Af Amer: >60 mL/min (ref >60)
Glucose, Bld: 118 mg/dL — ABNORMAL HIGH (ref 70–99)
Potassium: 4.3 mmol/L (ref 3.5–5.1)
Sodium: 138 mmol/L (ref 135–145)
Total Bilirubin: 0.5 mg/dL (ref 0.3–1.2)
Total Protein: 7.1 g/dL (ref 6.5–8.1)

## 2019-10-22 LAB — CBC
HCT: 39.2 % (ref 36.0–46.0)
Hemoglobin: 12.4 g/dL (ref 12.0–15.0)
MCH: 29.9 pg (ref 26.0–34.0)
MCHC: 31.6 g/dL (ref 30.0–36.0)
MCV: 94.5 fL (ref 80.0–100.0)
Platelets: 309 10*3/uL (ref 150–400)
RBC: 4.15 MIL/uL (ref 3.87–5.11)
RDW: 12.6 % (ref 11.5–15.5)
WBC: 8.8 10*3/uL (ref 4.0–10.5)
nRBC: 0 % (ref 0.0–0.2)

## 2019-10-22 LAB — I-STAT CHEM 8, ED
BUN: 16 mg/dL (ref 8–23)
Calcium, Ion: 1.2 mmol/L (ref 1.15–1.40)
Chloride: 103 mmol/L (ref 98–111)
Creatinine, Ser: 0.8 mg/dL (ref 0.44–1.00)
Glucose, Bld: 108 mg/dL — ABNORMAL HIGH (ref 70–99)
HCT: 38 % (ref 36.0–46.0)
Hemoglobin: 12.9 g/dL (ref 12.0–15.0)
Potassium: 4.1 mmol/L (ref 3.5–5.1)
Sodium: 139 mmol/L (ref 135–145)
TCO2: 28 mmol/L (ref 22–32)

## 2019-10-22 LAB — APTT: aPTT: 29 seconds (ref 24–36)

## 2019-10-22 LAB — RESPIRATORY PANEL BY RT PCR (FLU A&B, COVID)
Influenza A by PCR: NEGATIVE
Influenza B by PCR: NEGATIVE
SARS Coronavirus 2 by RT PCR: NEGATIVE

## 2019-10-22 MED ORDER — SODIUM CHLORIDE 0.9% FLUSH
3.0000 mL | Freq: Once | INTRAVENOUS | Status: AC
Start: 2019-10-22 — End: 2019-10-22
  Administered 2019-10-22: 3 mL via INTRAVENOUS

## 2019-10-22 MED ORDER — DEXAMETHASONE SODIUM PHOSPHATE 10 MG/ML IJ SOLN
6.0000 mg | Freq: Once | INTRAMUSCULAR | Status: AC
  Administered 2019-10-22: 6 mg via INTRAVENOUS
  Filled 2019-10-22: qty 1

## 2019-10-22 MED ORDER — METHYLPREDNISOLONE 4 MG PO TBPK: ORAL_TABLET | ORAL | 0 refills | Status: DC

## 2019-10-22 NOTE — Telephone Encounter (Signed)
Spoke with patient and she reports speech slurred, numbness to hands, and fingers. She inquired if she could have had a stroke and advised that without testing we could not determine that here in the office but that she may want to come in to discuss so that provider can determine if additional testing needs to be done. Inquired if she has established with neurologist and she stated yes. Recommended that she call them to see if she can get in to be seen and if there is a delay then she could come in to discuss with our APP. Also reviewed that she should go to ED if these symptoms return or persist for further evaluation. She verbalized understanding of our conversation, agreement with plan, and had no further questions at this time. She will call neurology and if not able to get in to see them then she will call back to see APP here in our office to discuss concerns.

## 2019-10-22 NOTE — Telephone Encounter (Signed)
Looks like her neurologist recommended she go to ED for evaluation. There is another note from neurology's support staff to work on getting her evaluated in their office. ED or neurology would be most appropriate evaluation.   Thanks for reviewing ED precautions and recommendation to discuss with neurology with her.   Loel Dubonnet, NP

## 2019-10-22 NOTE — Discharge Instructions (Signed)
You have some blood in your brain causing your numbness and slurred speech.   Please take medrol dose pack as prescribed.   Avoid taking any aspirin or motrin.   Your hematoma can be from any minor head injury or cpap being too tight. Please avoid any head injury or falls. Talk to your doctor about your CPAP machine   See Dr. Saintclair Halsted for follow up in a week   Return to ER if you have worse slurred speech, weakness, numbness

## 2019-10-22 NOTE — Telephone Encounter (Signed)
Patient states that on Friday her fingers in her right hand went numb, she drove to the "drug store"  And her feeling came back. States on her way home her speech became slurred, states her speech was slurred several times over the weekend. States this morning her fingers went numb again. States she also has pressure in her head. Please call to discuss.
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
## 2019-10-22 NOTE — ED Triage Notes (Signed)
Pt here from home with c/o slurred speech and right arm numbness all of which have resolved upon arrival ,

## 2019-10-22 NOTE — ED Notes (Signed)
Patient transported to CT 

## 2019-10-24 DIAGNOSIS — I6200 Nontraumatic subdural hemorrhage, unspecified: Secondary | ICD-10-CM | POA: Diagnosis not present

## 2019-10-24 DIAGNOSIS — I10 Essential (primary) hypertension: Secondary | ICD-10-CM | POA: Diagnosis not present

## 2019-10-24 NOTE — Telephone Encounter (Signed)
Patient calling back in wanting to discuss what she needs to do regarding BP medication.

## 2019-10-25 ENCOUNTER — Other Ambulatory Visit: Payer: Self-pay | Admitting: Student

## 2019-10-25 DIAGNOSIS — S065XAA Traumatic subdural hemorrhage with loss of consciousness status unknown, initial encounter: Secondary | ICD-10-CM

## 2019-10-25 DIAGNOSIS — Z6828 Body mass index (BMI) 28.0-28.9, adult: Secondary | ICD-10-CM | POA: Insufficient documentation

## 2019-10-25 DIAGNOSIS — S065X9A Traumatic subdural hemorrhage with loss of consciousness of unspecified duration, initial encounter: Secondary | ICD-10-CM | POA: Diagnosis not present

## 2019-10-25 NOTE — Telephone Encounter (Signed)
Left voicemail message to call back  

## 2019-10-26 ENCOUNTER — Emergency Department: Payer: PPO

## 2019-10-26 ENCOUNTER — Emergency Department
Admission: EM | Admit: 2019-10-26 | Discharge: 2019-10-26 | Disposition: A | Discharge status: Home / Self Care | Payer: PPO | Attending: Emergency Medicine | Admitting: Emergency Medicine

## 2019-10-26 ENCOUNTER — Encounter: Payer: Self-pay | Admitting: Emergency Medicine

## 2019-10-26 ENCOUNTER — Other Ambulatory Visit: Payer: Self-pay

## 2019-10-26 DIAGNOSIS — Z5321 Procedure and treatment not carried out due to patient leaving prior to being seen by health care provider: Secondary | ICD-10-CM | POA: Insufficient documentation

## 2019-10-26 DIAGNOSIS — I10 Essential (primary) hypertension: Secondary | ICD-10-CM | POA: Insufficient documentation

## 2019-10-26 LAB — COMPREHENSIVE METABOLIC PANEL
ALT: 19 U/L (ref 0–44)
AST: 19 U/L (ref 15–41)
Albumin: 4.3 g/dL (ref 3.5–5.0)
Alkaline Phosphatase: 69 U/L (ref 38–126)
Anion gap: 13 (ref 5–15)
BUN: 24 mg/dL — ABNORMAL HIGH (ref 8–23)
CO2: 25 mmol/L (ref 22–32)
Calcium: 9.4 mg/dL (ref 8.9–10.3)
Chloride: 98 mmol/L (ref 98–111)
Creatinine, Ser: 0.72 mg/dL (ref 0.44–1.00)
GFR calc Af Amer: >60 mL/min (ref >60)
GFR calc non Af Amer: >60 mL/min (ref >60)
Glucose, Bld: 164 mg/dL — ABNORMAL HIGH (ref 70–99)
Potassium: 4.4 mmol/L (ref 3.5–5.1)
Sodium: 136 mmol/L (ref 135–145)
Total Bilirubin: 0.5 mg/dL (ref 0.3–1.2)
Total Protein: 7.5 g/dL (ref 6.5–8.1)

## 2019-10-26 LAB — CBC WITH DIFFERENTIAL/PLATELET
Abs Immature Granulocytes: 0.07 10*3/uL (ref 0.00–0.07)
Basophils Absolute: 0 10*3/uL (ref 0.0–0.1)
Basophils Relative: 0 %
Eosinophils Absolute: 0 10*3/uL (ref 0.0–0.5)
Eosinophils Relative: 0 %
HCT: 37.8 % (ref 36.0–46.0)
Hemoglobin: 12.3 g/dL (ref 12.0–15.0)
Immature Granulocytes: 1 %
Lymphocytes Relative: 16 %
Lymphs Abs: 1.7 10*3/uL (ref 0.7–4.0)
MCH: 30.1 pg (ref 26.0–34.0)
MCHC: 32.5 g/dL (ref 30.0–36.0)
MCV: 92.6 fL (ref 80.0–100.0)
Monocytes Absolute: 0.6 10*3/uL (ref 0.1–1.0)
Monocytes Relative: 5 %
Neutro Abs: 8.2 10*3/uL — ABNORMAL HIGH (ref 1.7–7.7)
Neutrophils Relative %: 78 %
Platelets: 342 10*3/uL (ref 150–400)
RBC: 4.08 MIL/uL (ref 3.87–5.11)
RDW: 12.7 % (ref 11.5–15.5)
WBC: 10.6 10*3/uL — ABNORMAL HIGH (ref 4.0–10.5)
nRBC: 0 % (ref 0.0–0.2)

## 2019-10-26 LAB — TROPONIN I (HIGH SENSITIVITY): Troponin I (High Sensitivity): 7 ng/L (ref <18)

## 2019-10-26 NOTE — Telephone Encounter (Signed)
Pt c/o BP issue: STAT if pt c/o blurred vision, one-sided weakness or slurred speech  1. What are your last 5 BP readings? See ed visit patient states pressure is uncontrolled with normal med doses   2. Are you having any other symptoms (ex. Dizziness, headache, blurred vision, passed out)?   Slurred / stuttering speech numbness in hands interim headaches   3. What is your BP issue? Wants asap appt to see someone was sent home from the ED without seeing one.

## 2019-10-26 NOTE — ED Triage Notes (Signed)
Pt arrived to ED with hypertension. Pt states her blood pressure was too high when checked at home and it was going to make her have another stroke. At home blood pressure was 189/60. Pt talkative with clear speech. No obvious neuro deficits noted in triage. Denies any other symptoms at this time.

## 2019-10-26 NOTE — ED Notes (Signed)
No answer when called several times from lobby 
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
## 2019-10-28 NOTE — Telephone Encounter (Signed)
Appears that she was seen in the emergency room October 1 but she left the ER without being evaluated Can we recommend that she increase clonidine up to 0.1 mg at lunch, and 0.2 mg before bed She has numerous medication intolerances making it difficult to treat, and labile hypertension in the setting of stress at home

## 2019-10-29 DIAGNOSIS — I10 Essential (primary) hypertension: Secondary | ICD-10-CM | POA: Diagnosis not present

## 2019-10-29 DIAGNOSIS — E782 Mixed hyperlipidemia: Secondary | ICD-10-CM | POA: Diagnosis not present

## 2019-10-29 MED ORDER — CLONIDINE HCL 0.1 MG PO TABS
0.1000 mg | ORAL_TABLET | ORAL | 3 refills | Status: DC
Start: 2019-10-29 — End: 2020-04-07

## 2019-10-29 NOTE — Telephone Encounter (Signed)
Spoke with patient and she was very upset about talking with other person in our office. Then discussed her blood pressures and what was going on. She states that she ended up going to ED but they told her to go home. I did review that if she should leave ED after seeing provider AMA then her insurance may not pay for that. Reviewed medications with her and she states that her primary care provider did make some medication changes for her. He placed her on Olmesartan 40 mg once daily, and she remains on bystolic, and clonidine. Reviewed provider recommendations to increase Clonidine to whole tablet 0.1 mg at lunch and 2 tablets (0.1 mg two pills) at bedtime. Recommended that she wait to see how the new changes affect her blood pressure and if they remain elevated to then start the clonidine as directed. She verbalized understanding of instructions, agreement with plan, and had no further questions at this time.

## 2019-10-29 NOTE — Telephone Encounter (Signed)
Patient did report that she was taking prednisone when this event occurred and they discontinued that medication due to the elevated blood pressures and how she was feeling. During our conversation she did not have any slurred speech or any problems with clear communications. She was aware of the plan with no further questions.

## 2019-10-30 ENCOUNTER — Other Ambulatory Visit: Payer: PPO

## 2019-10-30 ENCOUNTER — Ambulatory Visit
Admission: RE | Admit: 2019-10-30 | Discharge: 2019-10-30 | Disposition: A | Discharge status: Home / Self Care | Payer: PPO | Source: Ambulatory Visit | Attending: Student | Admitting: Student

## 2019-10-30 DIAGNOSIS — H18413 Arcus senilis, bilateral: Secondary | ICD-10-CM | POA: Diagnosis not present

## 2019-10-30 DIAGNOSIS — I7090 Unspecified atherosclerosis: Secondary | ICD-10-CM | POA: Diagnosis not present

## 2019-10-30 DIAGNOSIS — S065XAA Traumatic subdural hemorrhage with loss of consciousness status unknown, initial encounter: Secondary | ICD-10-CM

## 2019-10-30 DIAGNOSIS — I6200 Nontraumatic subdural hemorrhage, unspecified: Secondary | ICD-10-CM | POA: Diagnosis not present

## 2019-10-30 DIAGNOSIS — Z961 Presence of intraocular lens: Secondary | ICD-10-CM | POA: Diagnosis not present

## 2019-10-30 DIAGNOSIS — E119 Type 2 diabetes mellitus without complications: Secondary | ICD-10-CM | POA: Diagnosis not present

## 2019-10-30 DIAGNOSIS — I619 Nontraumatic intracerebral hemorrhage, unspecified: Secondary | ICD-10-CM | POA: Diagnosis not present

## 2019-10-30 DIAGNOSIS — H02831 Dermatochalasis of right upper eyelid: Secondary | ICD-10-CM | POA: Diagnosis not present

## 2019-10-30 DIAGNOSIS — G9389 Other specified disorders of brain: Secondary | ICD-10-CM | POA: Diagnosis not present

## 2019-11-01 DIAGNOSIS — S065X9A Traumatic subdural hemorrhage with loss of consciousness of unspecified duration, initial encounter: Secondary | ICD-10-CM | POA: Diagnosis not present

## 2019-11-24 ENCOUNTER — Other Ambulatory Visit: Payer: Self-pay

## 2019-11-24 ENCOUNTER — Encounter (HOSPITAL_COMMUNITY): Payer: Self-pay | Admitting: Emergency Medicine

## 2019-11-24 ENCOUNTER — Emergency Department (HOSPITAL_COMMUNITY)
Admission: EM | Admit: 2019-11-24 | Discharge: 2019-11-24 | Disposition: A | Discharge status: Home / Self Care | Payer: PPO | Attending: Emergency Medicine | Admitting: Emergency Medicine

## 2019-11-24 ENCOUNTER — Emergency Department (HOSPITAL_COMMUNITY): Payer: PPO

## 2019-11-24 DIAGNOSIS — R29818 Other symptoms and signs involving the nervous system: Secondary | ICD-10-CM | POA: Diagnosis not present

## 2019-11-24 DIAGNOSIS — R001 Bradycardia, unspecified: Secondary | ICD-10-CM | POA: Diagnosis not present

## 2019-11-24 DIAGNOSIS — Z7984 Long term (current) use of oral hypoglycemic drugs: Secondary | ICD-10-CM | POA: Insufficient documentation

## 2019-11-24 DIAGNOSIS — I618 Other nontraumatic intracerebral hemorrhage: Secondary | ICD-10-CM | POA: Diagnosis not present

## 2019-11-24 DIAGNOSIS — Z79899 Other long term (current) drug therapy: Secondary | ICD-10-CM | POA: Insufficient documentation

## 2019-11-24 DIAGNOSIS — J45909 Unspecified asthma, uncomplicated: Secondary | ICD-10-CM | POA: Diagnosis not present

## 2019-11-24 DIAGNOSIS — E119 Type 2 diabetes mellitus without complications: Secondary | ICD-10-CM | POA: Insufficient documentation

## 2019-11-24 DIAGNOSIS — I10 Essential (primary) hypertension: Secondary | ICD-10-CM | POA: Insufficient documentation

## 2019-11-24 DIAGNOSIS — G9389 Other specified disorders of brain: Secondary | ICD-10-CM | POA: Diagnosis not present

## 2019-11-24 DIAGNOSIS — Z91040 Latex allergy status: Secondary | ICD-10-CM | POA: Diagnosis not present

## 2019-11-24 DIAGNOSIS — I6523 Occlusion and stenosis of bilateral carotid arteries: Secondary | ICD-10-CM | POA: Diagnosis not present

## 2019-11-24 DIAGNOSIS — R202 Paresthesia of skin: Secondary | ICD-10-CM | POA: Insufficient documentation

## 2019-11-24 LAB — DIFFERENTIAL
Abs Immature Granulocytes: 0.02 10*3/uL (ref 0.00–0.07)
Basophils Absolute: 0 10*3/uL (ref 0.0–0.1)
Basophils Relative: 1 %
Eosinophils Absolute: 0.2 10*3/uL (ref 0.0–0.5)
Eosinophils Relative: 3 %
Immature Granulocytes: 0 %
Lymphocytes Relative: 30 %
Lymphs Abs: 2.1 10*3/uL (ref 0.7–4.0)
Monocytes Absolute: 0.8 10*3/uL (ref 0.1–1.0)
Monocytes Relative: 11 %
Neutro Abs: 4 10*3/uL (ref 1.7–7.7)
Neutrophils Relative %: 55 %

## 2019-11-24 LAB — COMPREHENSIVE METABOLIC PANEL
ALT: 21 U/L (ref 0–44)
AST: 22 U/L (ref 15–41)
Albumin: 4 g/dL (ref 3.5–5.0)
Alkaline Phosphatase: 65 U/L (ref 38–126)
Anion gap: 11 (ref 5–15)
BUN: 12 mg/dL (ref 8–23)
CO2: 27 mmol/L (ref 22–32)
Calcium: 9.5 mg/dL (ref 8.9–10.3)
Chloride: 101 mmol/L (ref 98–111)
Creatinine, Ser: 0.78 mg/dL (ref 0.44–1.00)
GFR, Estimated: >60 mL/min (ref >60)
Glucose, Bld: 114 mg/dL — ABNORMAL HIGH (ref 70–99)
Potassium: 4.1 mmol/L (ref 3.5–5.1)
Sodium: 139 mmol/L (ref 135–145)
Total Bilirubin: 0.5 mg/dL (ref 0.3–1.2)
Total Protein: 7 g/dL (ref 6.5–8.1)

## 2019-11-24 LAB — CBC
HCT: 39.7 % (ref 36.0–46.0)
Hemoglobin: 12.7 g/dL (ref 12.0–15.0)
MCH: 30.5 pg (ref 26.0–34.0)
MCHC: 32 g/dL (ref 30.0–36.0)
MCV: 95.2 fL (ref 80.0–100.0)
Platelets: 343 10*3/uL (ref 150–400)
RBC: 4.17 MIL/uL (ref 3.87–5.11)
RDW: 13 % (ref 11.5–15.5)
WBC: 7.1 10*3/uL (ref 4.0–10.5)
nRBC: 0 % (ref 0.0–0.2)

## 2019-11-24 LAB — APTT: aPTT: 29 seconds (ref 24–36)

## 2019-11-24 LAB — CBG MONITORING, ED: Glucose-Capillary: 108 mg/dL — ABNORMAL HIGH (ref 70–99)

## 2019-11-24 LAB — PROTIME-INR
INR: 0.9 (ref 0.8–1.2)
Prothrombin Time: 11.8 seconds (ref 11.4–15.2)

## 2019-11-24 MED ORDER — SODIUM CHLORIDE 0.9% FLUSH: 3.0000 mL | Freq: Once | INTRAVENOUS | Status: DC

## 2019-11-24 NOTE — Discharge Instructions (Addendum)
Please return for any problem.  Follow-up with neurosurgery as previously arranged.

## 2019-11-24 NOTE — ED Triage Notes (Signed)
Pt reports L hand and L cheek numbness that lasted 30 seconds-1 min this morning while at home.   Pt reports history of stroke and keeps telling symptoms of previous stroke instead of today's symptoms.  Denies any symptoms at present.  No arm drift.  Speech clear.
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
## 2019-11-26 DIAGNOSIS — I10 Essential (primary) hypertension: Secondary | ICD-10-CM | POA: Diagnosis not present

## 2019-11-29 DIAGNOSIS — Z6830 Body mass index (BMI) 30.0-30.9, adult: Secondary | ICD-10-CM | POA: Insufficient documentation

## 2019-11-29 DIAGNOSIS — S065X9A Traumatic subdural hemorrhage with loss of consciousness of unspecified duration, initial encounter: Secondary | ICD-10-CM | POA: Diagnosis not present

## 2019-12-03 DIAGNOSIS — R197 Diarrhea, unspecified: Secondary | ICD-10-CM | POA: Diagnosis not present

## 2019-12-07 DIAGNOSIS — R0989 Other specified symptoms and signs involving the circulatory and respiratory systems: Secondary | ICD-10-CM | POA: Diagnosis not present

## 2019-12-07 DIAGNOSIS — J029 Acute pharyngitis, unspecified: Secondary | ICD-10-CM | POA: Diagnosis not present

## 2019-12-13 DIAGNOSIS — F4321 Adjustment disorder with depressed mood: Secondary | ICD-10-CM | POA: Diagnosis not present

## 2019-12-13 DIAGNOSIS — I10 Essential (primary) hypertension: Secondary | ICD-10-CM | POA: Diagnosis not present

## 2019-12-18 DIAGNOSIS — G4733 Obstructive sleep apnea (adult) (pediatric): Secondary | ICD-10-CM | POA: Diagnosis not present

## 2020-01-01 DIAGNOSIS — S065X9A Traumatic subdural hemorrhage with loss of consciousness of unspecified duration, initial encounter: Secondary | ICD-10-CM | POA: Diagnosis not present

## 2020-01-01 DIAGNOSIS — G4733 Obstructive sleep apnea (adult) (pediatric): Secondary | ICD-10-CM | POA: Diagnosis not present

## 2020-01-08 DIAGNOSIS — R0602 Shortness of breath: Secondary | ICD-10-CM | POA: Diagnosis not present

## 2020-01-08 DIAGNOSIS — G4733 Obstructive sleep apnea (adult) (pediatric): Secondary | ICD-10-CM | POA: Diagnosis not present

## 2020-01-08 DIAGNOSIS — J449 Chronic obstructive pulmonary disease, unspecified: Secondary | ICD-10-CM | POA: Diagnosis not present

## 2020-01-31 DIAGNOSIS — S065X9A Traumatic subdural hemorrhage with loss of consciousness of unspecified duration, initial encounter: Secondary | ICD-10-CM | POA: Diagnosis not present

## 2020-02-01 ENCOUNTER — Other Ambulatory Visit: Payer: Self-pay | Admitting: Student

## 2020-02-01 DIAGNOSIS — S065XAA Traumatic subdural hemorrhage with loss of consciousness status unknown, initial encounter: Secondary | ICD-10-CM

## 2020-02-01 DIAGNOSIS — S065X9A Traumatic subdural hemorrhage with loss of consciousness of unspecified duration, initial encounter: Secondary | ICD-10-CM

## 2020-02-07 DIAGNOSIS — R0602 Shortness of breath: Secondary | ICD-10-CM | POA: Diagnosis not present

## 2020-02-18 ENCOUNTER — Other Ambulatory Visit: Payer: Self-pay | Admitting: Cardiovascular Disease

## 2020-02-19 ENCOUNTER — Ambulatory Visit
Admission: RE | Admit: 2020-02-19 | Discharge: 2020-02-19 | Disposition: A | Discharge status: Home / Self Care | Payer: PPO | Source: Ambulatory Visit | Attending: Student | Admitting: Student

## 2020-02-19 ENCOUNTER — Other Ambulatory Visit: Payer: Self-pay

## 2020-02-19 DIAGNOSIS — Z87828 Personal history of other (healed) physical injury and trauma: Secondary | ICD-10-CM | POA: Diagnosis not present

## 2020-02-19 DIAGNOSIS — I672 Cerebral atherosclerosis: Secondary | ICD-10-CM | POA: Diagnosis not present

## 2020-02-19 DIAGNOSIS — I6200 Nontraumatic subdural hemorrhage, unspecified: Secondary | ICD-10-CM | POA: Diagnosis not present

## 2020-02-19 DIAGNOSIS — S065XAA Traumatic subdural hemorrhage with loss of consciousness status unknown, initial encounter: Secondary | ICD-10-CM

## 2020-02-19 DIAGNOSIS — S065X9A Traumatic subdural hemorrhage with loss of consciousness of unspecified duration, initial encounter: Secondary | ICD-10-CM

## 2020-02-22 DIAGNOSIS — G4733 Obstructive sleep apnea (adult) (pediatric): Secondary | ICD-10-CM | POA: Diagnosis not present

## 2020-02-22 DIAGNOSIS — E785 Hyperlipidemia, unspecified: Secondary | ICD-10-CM | POA: Diagnosis not present

## 2020-02-22 DIAGNOSIS — I10 Essential (primary) hypertension: Secondary | ICD-10-CM | POA: Diagnosis not present

## 2020-02-22 DIAGNOSIS — E782 Mixed hyperlipidemia: Secondary | ICD-10-CM | POA: Diagnosis not present

## 2020-02-22 DIAGNOSIS — E1169 Type 2 diabetes mellitus with other specified complication: Secondary | ICD-10-CM | POA: Diagnosis not present

## 2020-02-25 NOTE — Progress Notes (Signed)
Cardiology Office Note    Date:  02/26/2020   ID:  Yvonne Davidson, DOB 01-19-1941, MRN [id number]  PCP:  Dion Body, MD  Cardiologist:  Ida Rogue, MD  Electrophysiologist:  None   Chief Complaint: Chest pain  History of Present Illness:   Yvonne Davidson is a 80 y.o. female with history of carotid artery disease, DM2, HTN with right-sided RAS, SDH, HLD, OSA, chronic lower extremity pain, depression, anxiety, adjustment disorder, and GERD who presents for evaluation of chest pain.  Remote echo from 05/2011 showed an EF of 60% with trace aortic insufficiency, mild mitral regurgitation, and mild tricuspid regurgitation.  Echo from 10/2016 showed an EF of 60 to 65%, no regional wall motion abnormalities, grade 1 diastolic dysfunction, trivial aortic insufficiency, mild mitral regurgitation, and a mildly dilated left atrium.  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  She was referred for further evaluation of these findings though I do not see that she saw anyone for this.  She was last seen in the office in 04/2019 noting increased stress at home.  She continued to note elevated BP in the middle of the night, checking her readings around 3 AM.  Her fluctuating BP was felt to be exacerbated by stressors at home.  Follow-up carotid artery ultrasound in 04/2019 showed 40-59% RICA stenosis, 62-56% LICA stenosis, antegrade flow of the bilateral vertebral arteries and normal flow hemodynamics of the bilateral subclavian arteries.  She was seen in the ED in late 09/2019 and diagnosed with a subdural hematoma followed by neurosurgery.  She was seen again in the ED in 10/2019 with paresthesias involving the left cheek with CT imaging being unrevealing.  It was noted she had established  follow-up with neurosurgery as an outpatient.  She has been evaluated by pulmonology in 12/2019 for shortness of breath.  Echo on 02/07/2020, performed at outside office, showed an EF greater than 55%, normal wall motion, grade 1 diastolic dysfunction, mildly dilated left atrium, mild aortic insufficiency, mild mitral regurgitation, mild tricuspid regurgitation, mild pulmonic regurgitation, normal PASP, and normal RV systolic function as read by outside office.  PFTs are unavailable for review.  She comes in today noting 3 separate episodes of substernal chest discomfort that radiated to her back and occurred randomly with each episode lasting approximately 1 minute followed by spontaneous resolution. After her first episode she did check her blood pressure and it was noted to be in the 389H systolic. She did not obtain BP readings following her subsequent 2 episodes. Symptoms are nonexertional. No associated symptoms. Currently chest pain-free. With regards to her hypertension, she is currently taking clonidine 0.05 mg in the morning and lunch with 0.2 mg in the evening along with Bystolic 10 mg and olmesartan 40 mg.   Labs independently reviewed: 01/2020 - potassium 4.5, BUN 12, serum creatinine 0.8, albumin 4.3, AST/ALT normal, Hgb 12.8, PLT 260, TC 225, TG 256, HDL 39, LDL 135 09/2018 - TSH normal  Past Medical History:  Diagnosis Date  . Allergy   . Anemia   . Anxiety   . Arthritis   . Bilateral cataracts    immature  . Chronic sinusitis    takes Cetirizine daily  . Depression    doesn't take any meds  .  Diabetes mellitus without complication (HCC)    borderline  . Diverticulosis   . Dry eyes   . GERD (gastroesophageal reflux disease)    TAKES OMEPRAZOLE DAILY  . Hashimoto's thyroiditis   . History of blood transfusion    in the 60's no abnormal reaction noted  . History of echocardiogram    a. 05/2011: EF 60%, trace AI, trace MR, mild TR  . Hyperlipidemia    a. self discontinued  statin  . Hypertension   . Insomnia    takes Trazodone nightly  . Medication intolerance   . OSA (obstructive sleep apnea)    doesn't use a cpap  . Osteoporosis    takes Vit D as needed  . PONV (postoperative nausea and vomiting)    hard to wake up    Past Surgical History:  Procedure Laterality Date  . CHOLECYSTECTOMY  12/07/2012   Dr Lucia Gaskins  . CHOLECYSTECTOMY N/A 12/07/2012   Procedure: LAPAROSCOPIC CHOLECYSTECTOMY WITH INTRAOPERATIVE CHOLANGIOGRAM;  Surgeon: Shann Medal, MD;  Location: [facility];  Service: General;  Laterality: N/A;  . LIPOSUCTION    . NASAL SINUS SURGERY  2006   x 2  . right arm surgery  1960  . TCS      Current Medications: Current Meds  Medication Sig  . Artificial Tear Ointment (REFRESH LACRI-LUBE) OINT Apply 1 application to eye at bedtime.  Marland Kitchen azelastine (OPTIVAR) 0.05 % ophthalmic solution Place 1 drop into both eyes.  . B-D ULTRA-FINE 33 LANCETS MISC Use as directed. Dx 250.0  . Blood Glucose Monitoring Suppl (ONE TOUCH ULTRA MINI) W/DEVICE KIT USE AS DIRECTED  . BYSTOLIC 10 MG tablet TAKE 1 TABLET BY MOUTH EVERY DAY  . carboxymethylcellulose (REFRESH PLUS) 0.5 % SOLN Place 1 drop into both eyes 3 (three) times daily as needed (for dry eyes).   . cloNIDine (CATAPRES) 0.1 MG tablet Take 1-2 tablets (0.1-0.2 mg total) by mouth as directed. Take 1/2 tablet (0.005 mg) in the AM, take whole tablet 0.1 mg at Lunch, and take 2 tablets (0.2 mg) at bedtime.  . colestipol (COLESTID) 1 g tablet Take 1 g by mouth 2 (two) times daily.  . metFORMIN (GLUCOPHAGE) 500 MG tablet Take 1 tablet (500 mg total) by mouth 2 (two) times daily with a meal. (Patient taking differently: Take 500 mg by mouth daily. Patient takes 1/2 tablet by mouth BID)  . olmesartan (BENICAR) 40 MG tablet Take by mouth.  . Olopatadine HCl (PAZEO) 0.7 % SOLN Place 1 drop into both eyes.  Glory Rosebush DELICA LANCETS 75O MISC Use as directed. Dx 250.0  . PROBIOTIC PRODUCT PO Take by mouth daily.      Allergies:   Buprenorphine hcl, Chlorphen-diphenhyd-pe-apap, Codeine, Mold extract [trichophyton mentagrophyte], Morphine and related, Oxycodone, Oxycodone hcl, Oxycodone hcl, Sumycin [tetracycline hcl], Cardura [doxazosin mesylate], Chlorphen-phenyleph-asa, Decongest-aid [pseudoephedrine], Esomeprazole magnesium, Esomeprazole magnesium, Hydralazine, Hydrochlorothiazide, Indomethacin, Procaine hcl, Keflex [cephalexin], Latex, Neomycin-bacitracin zn-polymyx, Pseudoephedrine hcl er, Sudafed pe cold & cough child  [phenylephrine-dm], Telithromycin, and Tetracycline   Social History   Socioeconomic History  . Marital status: Married    Spouse name: Not on file  . Number of children: 4  . Years of education: Not on file  . Highest education level: Not on file  Occupational History  . 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 Smoking status: Never Smoker  . Smokeless tobacco: Never Used  . Tobacco comment: quit in 1978  Vaping Use  . Vaping Use: Never used  Substance and Sexual Activity  . Alcohol use: No    Alcohol/week: 0.0 standard drinks  . Drug use: No  . Sexual activity: Not on file  Other Topics Concern  . Not on file  Social History Narrative   Lives with friend. Has 4 children.   Social Determinants of Health   Financial Resource Strain: Not on file  Food Insecurity: Not on file  Transportation Needs: Not on file  Physical Activity: Not on file  Stress: Not on file  Social Connections: Not on file     Family History:  The patient's family history includes Alcohol abuse in her father; Aneurysm in her mother; Arthritis in her mother; Breast cancer in her mother; Cancer in her mother; Early death in her father; Heart disease in her mother; Hyperlipidemia in her mother; Hypertension in her mother; Stroke in her mother.  ROS:   Review of Systems  Constitutional: Negative for chills, diaphoresis, fever, malaise/fatigue and weight loss.  HENT: Negative for  congestion.   Eyes: Negative for discharge and redness.  Respiratory: Positive for shortness of breath. Negative for cough, sputum production and wheezing.   Cardiovascular: Positive for chest pain. Negative for palpitations, orthopnea, claudication, leg swelling and PND.  Gastrointestinal: Negative for abdominal pain, heartburn, nausea and vomiting.  Musculoskeletal: Positive for back pain. Negative for falls and myalgias.  Skin: Negative for rash.  Neurological: Negative for dizziness, tingling, tremors, sensory change, speech change, focal weakness, loss of consciousness and weakness.  Endo/Heme/Allergies: Does not bruise/bleed easily.  Psychiatric/Behavioral: Negative for substance abuse. The patient is not nervous/anxious.   All other systems reviewed and are negative.    EKGs/Labs/Other Studies Reviewed:    Studies reviewed were summarized above. The additional studies were reviewed today:  2D echo 02/07/2020 (outside office performed and read): EF greater than 55%, normal wall motion, grade 1 diastolic dysfunction, normal RV systolic function, mild AI/MR/PR/TR, normal RV systolic function, normal PASP. __________  2D echo 10/2016: - Left ventricle: The cavity size was normal. There was moderate  concentric hypertrophy. Systolic function was normal. The  estimated ejection fraction was in the range of 60% to 65%. Wall  motion was normal; there were no regional wall motion  abnormalities. Doppler parameters are consistent with abnormal  left ventricular relaxation (grade 1 diastolic dysfunction).  - Aortic valve: There was trivial regurgitation.  - Mitral valve: There was mild regurgitation.  - Left atrium: The atrium was mildly dilated.  - Pulmonary arteries: Systolic pressure could not be accurately  estimated.  - Pericardium, extracardiac: A trivial pericardial effusion was  identified posterior to the heart.    EKG:  EKG is ordered today.  The EKG ordered  today demonstrates sinus bradycardia, 58 bpm, no acute ST-T changes  Recent Labs: 11/24/2019: ALT 21; BUN 12; Creatinine, Ser 0.78; Hemoglobin 12.7; Platelets 343; Potassium 4.1; Sodium 139  Recent Lipid Panel    Component Value Date/Time   CHOL 126 03/03/2015 0839   TRIG 62 03/03/2015 0839   HDL 47 03/03/2015 0839   CHOLHDL 2.7 03/03/2015 0839   CHOLHDL 6 01/15/2014 0817   VLDL 39.6 01/15/2014 0817   LDLCALC 67 03/03/2015 0839   LDLDIRECT 156.6 05/17/2012 1544    PHYSICAL EXAM:    VS:  BP (!) 154/74 (BP Location: Left Arm, Patient Position: Sitting, Cuff Size: Normal)   Pulse (!) 58   Ht '5\' 2"'  (1.575 m)   Wt 155 lb (70.3 kg)   SpO2 98%   BMI 28.35 kg/m  BMI: Body mass index is 28.35 kg/m.  Physical Exam Vitals reviewed.  Constitutional:      Appearance: She is well-developed and well-nourished.  HENT:     Head: Normocephalic and atraumatic.  Eyes:     General:        Right eye: No discharge.        Left eye: No discharge.  Neck:     Vascular: No JVD.  Cardiovascular:     Rate and Rhythm: Regular rhythm. Bradycardia present.     Pulses: No midsystolic click and no opening snap.          Posterior tibial pulses are 2+ on the right side and 2+ on the left side.     Heart sounds: Normal heart sounds, S1 normal and S2 normal. Heart sounds not distant. No murmur heard. No friction rub.  Pulmonary:     Effort: Pulmonary effort is normal. No respiratory distress.     Breath sounds: Normal breath sounds. No decreased breath sounds, wheezing or rales.  Chest:     Chest wall: No tenderness.  Abdominal:     General: There is no distension.     Palpations: Abdomen is soft.     Tenderness: There is no abdominal tenderness.  Musculoskeletal:        General: No edema.     Cervical back: Normal range of motion.  Skin:    General: Skin is warm and dry.     Nails: There is no clubbing or cyanosis.  Neurological:     Mental Status: She is alert and oriented to person,  place, and time.  Psychiatric:        Mood and Affect: Mood and affect normal.        Speech: Speech normal.        Behavior: Behavior normal.        Thought Content: Thought content normal.        Judgment: Judgment normal.     Wt Readings from Last 3 Encounters:  02/26/20 155 lb (70.3 kg)  10/26/19 151 lb 14.4 oz (68.9 kg)  10/04/19 152 lb (68.9 kg)     ASSESSMENT & PLAN:   1. Chest pain/dyspnea: Currently chest pain-free. She has noted 3 brief episodes of chest discomfort that radiates through to her back randomly occurring with each episode lasting 1 minute followed by spontaneous resolution. History is not consistent with aortic dissection. Schedule Lexiscan MPI to evaluate for high risk ischemia given chest pain and history of dyspnea.  Risks and benefits of pharmacological nuclear stress test were discussed in detail.  Deferred coronary CTA given significant allergies and she indicates she has never received contrast before. Recent echo performed at outside office showed normal LV systolic function with normal PASP.  2. HTN: Blood pressure is mildly elevated in the office today though she does have significant medication intolerances. Prior to making any medication adjustments we will update her renal artery ultrasound as outlined below. Continue current antihypertensive therapy which includes clonidine 0.05 mg in the morning and at lunch with 0.2 mg in the evening as well as Bystolic 10 mg, and olmesartan 40 mg. Low-sodium diet.  3. Renal artery stenosis: Renal artery ultrasound in 2018 demonstrated greater than 60% right renal artery stenosis. Given continued difficult to control blood pressure and in the setting of medication intolerances, which would make ongoing medical management of this difficult we will update her renal artery ultrasound with further recommendations pending.  4. 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 We will update renal artery ultrasound as outlined above with further recommendations pending.  5. Carotid artery disease: Most recent carotid artery ultrasound from 04/2019 demonstrated 40 to 59% R ICA stenosis and 60 to 63% LICA stenosis with slight progression of disease noted along the right side and stable left-sided disease. She will be due for follow-up carotid artery ultrasound in 04/2020.  6. HLD: LDL 135 from 01/2020. Would consider starting statin therapy, though with multiple medication intolerances will defer this to her primary cardiologist.  7. OSA: CPAP.  This was not discussed in detail at today's visit.  Disposition: F/u with Dr. Rockey Situ or an APP in 2 months.   Medication Adjustments/Labs and Tests Ordered: Current medicines are reviewed at length with the patient today.  Concerns regarding medicines are outlined above. Medication changes, Labs and Tests ordered today are summarized above and listed in the Patient Instructions accessible in Encounters.   SignedChristell Faith, PA-C 02/26/2020 1:15 PM     [address] [place], [place] [phone]

## 2020-02-26 ENCOUNTER — Telehealth: Payer: Self-pay | Admitting: Physician Assistant

## 2020-02-26 ENCOUNTER — Other Ambulatory Visit: Payer: Self-pay

## 2020-02-26 ENCOUNTER — Encounter: Payer: Self-pay | Admitting: Physician Assistant

## 2020-02-26 ENCOUNTER — Ambulatory Visit: Payer: PPO | Admitting: Physician Assistant

## 2020-02-26 VITALS — BP 154/74 | HR 58 | Ht 62.0 in | Wt 155.0 lb

## 2020-02-26 DIAGNOSIS — R0602 Shortness of breath: Secondary | ICD-10-CM | POA: Diagnosis not present

## 2020-02-26 DIAGNOSIS — I701 Atherosclerosis of renal artery: Secondary | ICD-10-CM

## 2020-02-26 DIAGNOSIS — I739 Peripheral vascular disease, unspecified: Secondary | ICD-10-CM | POA: Diagnosis not present

## 2020-02-26 DIAGNOSIS — R079 Chest pain, unspecified: Secondary | ICD-10-CM

## 2020-02-26 DIAGNOSIS — I10 Essential (primary) hypertension: Secondary | ICD-10-CM

## 2020-02-26 DIAGNOSIS — I6523 Occlusion and stenosis of bilateral carotid arteries: Secondary | ICD-10-CM | POA: Diagnosis not present

## 2020-02-26 DIAGNOSIS — E785 Hyperlipidemia, unspecified: Secondary | ICD-10-CM

## 2020-02-26 NOTE — Telephone Encounter (Signed)
Left voicemail message that scheduled appointment for tomorrow (Carotid) was cancelled and need to schedule different test (Renal US), with instructions to please call back.

## 2020-02-26 NOTE — Patient Instructions (Addendum)
Medication Instructions:  No changes  *If you need a refill on your cardiac medications before your next appointment, please call your pharmacy*   Lab Work: None  If you have labs (blood work) drawn today and your tests are completely normal, you will receive your results only by: Marland Kitchen MyChart Message (if you have MyChart) OR . A paper copy in the mail If you have any lab test that is abnormal or we need to change your treatment, we will call you to review the results.   Testing/Procedures: Your physician has requested that you have a renal artery duplex. During this test, an ultrasound is used to evaluate blood flow to the kidneys. Allow one hour for this exam. Do not eat after midnight the day before and avoid carbonated beverages. Take your medications as you usually do.   [place]  Your caregiver has ordered a Stress Test with nuclear imaging. The purpose of this test is to evaluate the blood supply to your heart muscle. This procedure is referred to as a "Non-Invasive Stress Test." This is because other than having an IV started in your vein, nothing is inserted or "invades" your body. Cardiac stress tests are done to find areas of poor blood flow to the heart by determining the extent of coronary artery disease (CAD). Some patients exercise on a treadmill, which naturally increases the blood flow to your heart, while others who are  unable to walk on a treadmill due to physical limitations have a pharmacologic/chemical stress agent called Lexiscan . This medicine will mimic walking on a treadmill by temporarily increasing your coronary blood flow.   Please note: these test may take anywhere between 2-4 hours to complete  PLEASE REPORT TO [facility] MEDICAL MALL ENTRANCE  THE VOLUNTEERS AT THE FIRST DESK WILL DIRECT YOU WHERE TO GO  Date of Procedure:_____________________________________  Arrival Time for Procedure:______________________________  Instructions regarding medication:    _XX___ : Hold diabetes medication (Metformin) the morning of procedure    PLEASE NOTIFY THE OFFICE AT LEAST 24 HOURS IN ADVANCE IF YOU ARE UNABLE TO KEEP YOUR APPOINTMENT.  [phone] AND  PLEASE NOTIFY NUCLEAR MEDICINE AT [facility] AT LEAST 24 HOURS IN ADVANCE IF YOU ARE UNABLE TO KEEP YOUR APPOINTMENT. [phone]  How to prepare for your Myoview test:  1. Do not eat or drink after midnight 2. No caffeine for 24 hours prior to test 3. No smoking 24 hours prior to test. 4. Your medication may be taken with water.  If your doctor stopped a medication because of this test, do not take that medication. 5. Ladies, please do not wear dresses.  Skirts or pants are appropriate. Please wear a short sleeve shirt. 6. No perfume, cologne or lotion. 7. Wear comfortable walking shoes. No heels!     Follow-Up: At [facility], you and your health needs are our priority.  As part of our continuing mission to provide you with exceptional heart care, we have created designated Provider Care Teams.  These Care Teams include your primary Cardiologist (physician) and Advanced Practice Providers (APPs -  Physician Assistants and Nurse Practitioners) who all work together to provide you with the care you need, when you need it.   Your next appointment:   2 month(s)  The format for your next appointment:   In Person  Provider:   Ida Rogue, MD or Christell Faith, PA-C

## 2020-02-26 NOTE — Telephone Encounter (Signed)
Left voicemail message for patient to call back. Calling to discuss renal ultrasound to be scheduled and carotid was canceled.

## 2020-02-26 NOTE — Telephone Encounter (Signed)
Spoke with patient and reviewed appointment and test change due to error. She was understanding and grateful that I called her back so promptly to prevent her coming in tomorrow. Reviewed changes, transferred to scheduling, and new AVS mailed to address on file. She had no further questions at this time.

## 2020-02-29 DIAGNOSIS — I10 Essential (primary) hypertension: Secondary | ICD-10-CM | POA: Diagnosis not present

## 2020-02-29 DIAGNOSIS — E1169 Type 2 diabetes mellitus with other specified complication: Secondary | ICD-10-CM | POA: Diagnosis not present

## 2020-02-29 DIAGNOSIS — E785 Hyperlipidemia, unspecified: Secondary | ICD-10-CM | POA: Diagnosis not present

## 2020-02-29 DIAGNOSIS — E782 Mixed hyperlipidemia: Secondary | ICD-10-CM | POA: Diagnosis not present

## 2020-02-29 DIAGNOSIS — Z0000 Encounter for general adult medical examination without abnormal findings: Secondary | ICD-10-CM | POA: Diagnosis not present

## 2020-02-29 DIAGNOSIS — K2100 Gastro-esophageal reflux disease with esophagitis, without bleeding: Secondary | ICD-10-CM | POA: Diagnosis not present

## 2020-03-03 ENCOUNTER — Telehealth: Payer: Self-pay | Admitting: Cardiovascular Disease

## 2020-03-03 NOTE — Telephone Encounter (Signed)
Left voicemail message to call back  

## 2020-03-03 NOTE — Telephone Encounter (Signed)
Patient states she tried to use Good[facility] at [facility] however they told her she could not use Good Rx since she has insurance. I told her that is incorrect and she states she is going to have her prescription transferred to [facility] because she has been having difficulty with the pharmacy services at [facility] for some time. Offered to send a new Rx for Bystolic to [facility] however patient declined and states she will call them to have all of her prescriptions transferred. Patient expressed gratitude for the return call/information.

## 2020-03-03 NOTE — Telephone Encounter (Signed)
Spoke with patient and reviewed testing and reasons other tests were ruled out. Discussed testing in detail and she reports drinking 1/4 cup of grapefruit juice over the weekend and her chest pain returned with elevated blood pressure. She then wanted to know if all of this could be reflux. Advised that this test is to make sure it is not her heart. She then wanted to know why she could not do treadmill test. Placed her on hold to discuss with provider. Provider Thurmond Butts Dunn PA-C) reviewed chart and determined this test is best for her reported angina. Patient is not a candidate for other testing due to allergies and hypertension. Inquired if she wanted me to cancel her test and she then wanted to keep appointment for now and advised that if she wants to cancel she can call our office back. Confirmed date, time, and location with her in detail and she verbalized understanding with no further questions at this time. She verbalized understanding of our conversation, reasons for this test, and was agreeable at this time to proceed but did provide her with number if she decides to cancel.

## 2020-03-03 NOTE — Telephone Encounter (Signed)
Please advise 

## 2020-03-03 NOTE — Telephone Encounter (Signed)
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    Is there another assistance resource she could try to lower bystolic cost?

## 2020-03-03 NOTE — Telephone Encounter (Signed)
Patient returning call.

## 2020-03-03 NOTE — Telephone Encounter (Signed)
Patient calling Wants to discuss upcoming stress test - has questions she did not ask at appointment Please call to discuss

## 2020-03-11 DIAGNOSIS — S065X9A Traumatic subdural hemorrhage with loss of consciousness of unspecified duration, initial encounter: Secondary | ICD-10-CM | POA: Diagnosis not present

## 2020-03-12 ENCOUNTER — Ambulatory Visit (INDEPENDENT_AMBULATORY_CARE_PROVIDER_SITE_OTHER): Payer: PPO

## 2020-03-12 ENCOUNTER — Other Ambulatory Visit: Payer: Self-pay

## 2020-03-12 DIAGNOSIS — I701 Atherosclerosis of renal artery: Secondary | ICD-10-CM | POA: Diagnosis not present

## 2020-03-13 ENCOUNTER — Telehealth: Payer: Self-pay | Admitting: Physician Assistant

## 2020-03-13 DIAGNOSIS — K551 Chronic vascular disorders of intestine: Secondary | ICD-10-CM

## 2020-03-13 DIAGNOSIS — I701 Atherosclerosis of renal artery: Secondary | ICD-10-CM

## 2020-03-13 NOTE — Telephone Encounter (Signed)
I spoke with the patient regarding her renal artery [place] results. She voices understanding of these.  I have also advised her that [place], [place] had mentioned she was referred to Vascular Surgery before, but that not appointment had taken place.  The patient advised she did not remember ever being referred to Vascular Surgery.   I have reviewed her chart and it looks like the referral was from 2018 to Dr. Fletcher Anon for the right renal artery stenosis.  The patient is agreeable to a Vascular Surgery referral to further discuss options for her right renal artery stenosis & SMA stenosis.  I have advised the patient I will place a referral to surgery for her and that she should hear from their office directly for an appointment. I have asked that she call back if she has not heard anything from them in the next 1.5 weeks and we will call to follow up.  Referral placed to [facility].

## 2020-03-13 NOTE — Telephone Encounter (Signed)
Rise Mu, PA-C  03/12/2020 5:03 PM EST      Please inform patient renal artery ultrasound again showed moderate narrowing along the right renal artery as well as narrowing within some of her abdominal arteries. She was previously referred to vascular surgery for this though I do not see this visit occurred. Recommend we refer her again to vascular surgery.

## 2020-03-17 NOTE — Telephone Encounter (Signed)
Spoke with patient to see if she had received call regarding referral. She has not and advised I would reach out to them. Also reviewed stress test and she states she did not go and wants to wait on this for now and states that her pain is reflux as she has noticed this after meals. Advised that if she should change her mind to please call us and I would be happy to reschedule. Let her know that I would follow up on referral. She verbalized understanding with no further questions at this time.   Spoke with Destiny at [facility] to confirm referral and they will reach to her to schedule this.   Patient verbalized understanding of our conversation, agreement with plan, and had no further questions at this time. [facility] confirmed referral and is calling to schedule appointment with her at this time.

## 2020-03-18 ENCOUNTER — Telehealth (INDEPENDENT_AMBULATORY_CARE_PROVIDER_SITE_OTHER): Payer: Self-pay

## 2020-03-18 ENCOUNTER — Ambulatory Visit (INDEPENDENT_AMBULATORY_CARE_PROVIDER_SITE_OTHER): Payer: PPO | Admitting: Vascular Surgery

## 2020-03-18 ENCOUNTER — Other Ambulatory Visit: Payer: Self-pay

## 2020-03-18 VITALS — BP 149/53 | HR 53 | Ht 60.0 in | Wt 156.0 lb

## 2020-03-18 DIAGNOSIS — K551 Chronic vascular disorders of intestine: Secondary | ICD-10-CM

## 2020-03-18 DIAGNOSIS — E119 Type 2 diabetes mellitus without complications: Secondary | ICD-10-CM

## 2020-03-18 DIAGNOSIS — I701 Atherosclerosis of renal artery: Secondary | ICD-10-CM

## 2020-03-18 DIAGNOSIS — E785 Hyperlipidemia, unspecified: Secondary | ICD-10-CM | POA: Diagnosis not present

## 2020-03-18 DIAGNOSIS — I10 Essential (primary) hypertension: Secondary | ICD-10-CM | POA: Diagnosis not present

## 2020-03-18 DIAGNOSIS — I6381 Other cerebral infarction due to occlusion or stenosis of small artery: Secondary | ICD-10-CM

## 2020-03-18 NOTE — Assessment & Plan Note (Signed)
blood glucose control important in reducing the progression of atherosclerotic disease. Also, involved in wound healing. On appropriate medications.  

## 2020-03-18 NOTE — Assessment & Plan Note (Signed)
Her renal artery duplex performed at her cardiologist office demonstrates elevated velocities in the right renal artery that I would interpret as a potentially hemodynamically significant right renal artery stenosis although the official return potation is 1 to 59% right renal artery stenosis.  The left renal artery velocities were within the normal range.  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  She does describe abdominal pain that is postprandial.  This could potentially be some degree of mesenteric ischemia on a chronic basis.  She has not had unintentional weight loss and does not really have food fear.  We are going to address renal artery issue first but may be able to image her visceral vessels as well while we are doing the angiogram.  We told her that we could not intervene on both beds at the same time.

## 2020-03-18 NOTE — Assessment & Plan Note (Signed)
The patient has poorly controlled blood pressure despite 3 antihypertensives.  Her renal artery duplex performed at her cardiologist office demonstrates elevated velocities in the right renal artery that I would interpret as a potentially hemodynamically significant right renal artery stenosis although the official return potation is 1 to 59% right renal artery stenosis.  The left renal artery velocities were within the normal range. Given her poorly controlled hypertension on multiple medications and potential renal artery stenosis on the duplex, the patient should undergo angiography with possible intervention if indeed significant renal artery stenosis is identified.  Risks and benefits the procedure were discussed with the patient in detail.  Given her potential mesenteric artery disease, we may be able to image this as well at the same time.  The patient desires to proceed.

## 2020-03-18 NOTE — Patient Instructions (Signed)
Renal Artery Stenosis Renal artery stenosis (RAS) is a narrowing of the artery that carries blood to the kidneys. It can affect one or both kidneys. The kidneys filter waste and extra fluid from the blood. Waste and fluid are then removed when a person passes urine. The kidneys also make an important chemical messenger (hormone) called renin. Renin helps regulate blood pressure. The first sign of RAS may be high blood pressure. Other symptoms can develop over time. What are the causes? A common cause of this condition is plaque buildup in your arteries (atherosclerosis). The plaques that cause this are made up of:  Fat.  Cholesterol.  Calcium.  Other substances. As these substances build up in your renal artery, the blood supply to your kidneys slows. The lack of blood and oxygen causes the signs and symptoms of RAS. A much less common cause of RAS is a disease called fibromuscular dysplasia. This disease causes abnormal cell growth that narrows the renal artery. It is not related to atherosclerosis. It occurs mostly in women who are 25-50 years old. It may be passed down through families.    What increases the risk? You are more likely to develop this condition if you:  Are a man who is at least 80 years old.  Are a woman who is at least 80 years old.  Have high blood pressure.  Have high cholesterol.  Are a smoker.  Abuse alcohol.  Have diabetes or prediabetes.  Are overweight or obese.  Have a family history of early heart disease. What are the signs or symptoms? RAS usually develops slowly. You may not have any signs or symptoms at first. Early signs may include:  Development of high blood pressure.  A sudden increase in existing high blood pressure.  No longer responding to medicine that used to control your blood pressure. Later signs and symptoms are due to kidney damage. They may include:  Feeling tired (fatigue).  Shortness of breath.  Swollen legs and  feet.  Dry skin.  Headaches.  Muscle cramps.  Loss of appetite.  Nausea or vomiting. How is this diagnosed? This condition may be diagnosed based on:  Your symptoms and medical history. Your health care provider may suspect RAS based on changes in your blood pressure and your risk factors.  A physical exam. During the exam, your health care provider will use a stethoscope to listen for a whooshing sound (bruit) that can occur where the renal artery is blocking blood flow.  Various tests. These may include: ? Blood and urine tests to check your kidney function. ? Imaging tests of your kidneys, such as:  A test that uses sound waves to create an image of your kidneys and the blood flow to your kidneys (ultrasound).  A test in which dye is injected into one of your blood vessels so images can be taken as the dye flows through your renal arteries (angiogram). This can be done using X-rays, a CT scan (computed tomography angiogram, CTA), or a type of MRI (magnetic resonance angiogram, MRA). How is this treated? Making lifestyle changes to reduce your risk factors is the first treatment option for early RAS. If the blood flow to one of your kidneys is cut by more than half, you may need medicine to:  Lower your blood pressure. This is the main medical treatment for RAS. You may need more than one type of medicine for this. The types that work best for people with RAS are: ? ACE inhibitors. ?   Angiotensin receptor blockers.  Reduce fluid in the body (diuretics).  Lower your cholesterol (statins). If medicine is not enough to control RAS, you may need surgery. This may involve:  Threading a tube with an inflatable balloon into the renal artery to force it to open (angioplasty).  Removing plaque from inside the artery (endarterectomy).   Follow these instructions at home: Lifestyle  Make any lifestyle changes recommended by your health care provider. This may include: ? Working with  a dietitian to maintain a heart-healthy diet. This type of diet is low in saturated fat, salt, and added sugar. ? Starting an exercise program as directed by your health care provider. ? Maintaining a healthy weight. ? Quitting smoking. ? Not abusing alcohol. General instructions  Take over-the-counter and prescription medicines only as told by your health care provider.  Keep all follow-up visits as told by your health care provider. This is important.   Contact a health care provider if:  Your symptoms of RAS are not getting better.  Your symptoms are changing or getting worse. Get help right away if you have:  Very bad pain in your back or abdomen.  Blood in your urine. Summary  Renal artery stenosis (RAS) is a narrowing of the artery that carries blood to the kidneys. It can affect one or both kidneys.  RAS usually develops slowly. You may not have any signs or symptoms at first, but high blood pressure that is difficult to control is a key symptom.  Making lifestyle changes to reduce your risk factors is the first treatment option for early RAS. If the blood flow to one of your kidneys is cut by more than half, you may need medicines to help manage your cholesterol and blood pressure. This information is not intended to replace advice given to you by your health care provider. Make sure you discuss any questions you have with your health care provider. Document Revised: 02/07/2017 Document Reviewed: 02/07/2017 [employer] Patient Education  2021 [employer]  

## 2020-03-18 NOTE — Assessment & Plan Note (Signed)
lipid control important in reducing the progression of atherosclerotic disease. Continue statin therapy  

## 2020-03-18 NOTE — Telephone Encounter (Signed)
Spoke with the patient to get her scheduled to have a right renal artery stent placement and per the patient, she will call me back as she was in the grocery store.

## 2020-03-18 NOTE — Assessment & Plan Note (Signed)
Potentially renovascular.  We discussed the importance of blood pressure control and reducing vascular disease.  Renal artery evaluation and possible intervention planned as below.
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
## 2020-03-20 ENCOUNTER — Telehealth (INDEPENDENT_AMBULATORY_CARE_PROVIDER_SITE_OTHER): Payer: Self-pay

## 2020-03-20 NOTE — Telephone Encounter (Signed)
1. She should not.  She will not be put to sleep and it is typically a day procedure (she goes home the same day) 2. Side effects include bruising, bleeding and soreness.  There is a small risk of pseudoaneurysm with any angiogram procedure.   3. If she does nothing it is possible that her renal artery could completely  occlude, leaving her with one useable kidney.  Also, her blood pressure will remain high and that too will cause damage to the kidneys leading to possible stroke and or dialysis 4.  This is hard to say. Its variable based on a number of factors.  We certainly have patients that have had stents in for over 20 years.  But this is also why we maintain a check on the stents on a regular basis.   Any other questions, she will need to be seen in office

## 2020-03-20 NOTE — Telephone Encounter (Signed)
Patient called with some questions about having a right renal artery stent placement. 1) She uses a CPAP machine do she bring it with her? 2) What are the side effects of doing the procedure? 3) If she does nothing what will happen?  and 4) How long do stents last ? Please advise. Thank you

## 2020-03-20 NOTE — Telephone Encounter (Signed)
Patient was given the answers to her questions that was posed to Eulogio Ditch NP, patient stated that after her sinus infection clears up she will contact me to get scheduled for her procedure.

## 2020-04-04 ENCOUNTER — Other Ambulatory Visit: Payer: Self-pay

## 2020-04-04 ENCOUNTER — Encounter: Payer: Self-pay | Admitting: Emergency Medicine

## 2020-04-04 DIAGNOSIS — R519 Headache, unspecified: Secondary | ICD-10-CM | POA: Diagnosis present

## 2020-04-04 DIAGNOSIS — E119 Type 2 diabetes mellitus without complications: Secondary | ICD-10-CM | POA: Diagnosis not present

## 2020-04-04 DIAGNOSIS — I10 Essential (primary) hypertension: Secondary | ICD-10-CM | POA: Insufficient documentation

## 2020-04-04 DIAGNOSIS — Z7984 Long term (current) use of oral hypoglycemic drugs: Secondary | ICD-10-CM | POA: Diagnosis not present

## 2020-04-04 DIAGNOSIS — Z79899 Other long term (current) drug therapy: Secondary | ICD-10-CM | POA: Insufficient documentation

## 2020-04-04 DIAGNOSIS — K551 Chronic vascular disorders of intestine: Secondary | ICD-10-CM | POA: Diagnosis not present

## 2020-04-04 DIAGNOSIS — I150 Renovascular hypertension: Secondary | ICD-10-CM | POA: Insufficient documentation

## 2020-04-04 DIAGNOSIS — Z91040 Latex allergy status: Secondary | ICD-10-CM | POA: Diagnosis not present

## 2020-04-04 NOTE — ED Triage Notes (Signed)
Pt presents to [facility] from home with complaints of high blood pressure at home around 222/67, pt reports she has renal artery disease to right kidney. Pt took clonidine prior to arrival. Pt reports has a headache. Pt denies any other symptom at present

## 2020-04-05 ENCOUNTER — Emergency Department
Admission: EM | Admit: 2020-04-05 | Discharge: 2020-04-05 | Disposition: A | Discharge status: Home / Self Care | Payer: PPO | Attending: Emergency Medicine | Admitting: Emergency Medicine

## 2020-04-05 DIAGNOSIS — I150 Renovascular hypertension: Secondary | ICD-10-CM

## 2020-04-05 LAB — CBC
HCT: 34.6 % — ABNORMAL LOW (ref 36.0–46.0)
Hemoglobin: 11.3 g/dL — ABNORMAL LOW (ref 12.0–15.0)
MCH: 30 pg (ref 26.0–34.0)
MCHC: 32.7 g/dL (ref 30.0–36.0)
MCV: 91.8 fL (ref 80.0–100.0)
Platelets: 231 10*3/uL (ref 150–400)
RBC: 3.77 MIL/uL — ABNORMAL LOW (ref 3.87–5.11)
RDW: 13.2 % (ref 11.5–15.5)
WBC: 7.7 10*3/uL (ref 4.0–10.5)
nRBC: 0 % (ref 0.0–0.2)

## 2020-04-05 LAB — URINALYSIS, ROUTINE W REFLEX MICROSCOPIC
Bacteria, UA: NONE SEEN
Bilirubin Urine: NEGATIVE
Glucose, UA: NEGATIVE mg/dL
Ketones, ur: NEGATIVE mg/dL
Leukocytes,Ua: NEGATIVE
Nitrite: NEGATIVE
Protein, ur: NEGATIVE mg/dL
Specific Gravity, Urine: 1.005 (ref 1.005–1.030)
pH: 5 (ref 5.0–8.0)

## 2020-04-05 LAB — BASIC METABOLIC PANEL
Anion gap: 8 (ref 5–15)
BUN: 20 mg/dL (ref 8–23)
CO2: 22 mmol/L (ref 22–32)
Calcium: 8.7 mg/dL — ABNORMAL LOW (ref 8.9–10.3)
Chloride: 106 mmol/L (ref 98–111)
Creatinine, Ser: 0.74 mg/dL (ref 0.44–1.00)
GFR, Estimated: >60 mL/min (ref >60)
Glucose, Bld: 129 mg/dL — ABNORMAL HIGH (ref 70–99)
Potassium: 3.6 mmol/L (ref 3.5–5.1)
Sodium: 136 mmol/L (ref 135–145)

## 2020-04-05 LAB — TROPONIN I (HIGH SENSITIVITY): Troponin I (High Sensitivity): 8 ng/L (ref <18)
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
## 2020-04-05 NOTE — Discharge Instructions (Addendum)
Return to the ER for chest pain, shortness of breath, dizziness, headache, blurry vision, slurred speech, unilateral weakness or numbness, facial droop, back pain or abdominal pain.  Otherwise make sure to follow-up with your primary care doctor for further monitoring of your blood pressure.

## 2020-04-06 ENCOUNTER — Other Ambulatory Visit: Payer: Self-pay

## 2020-04-06 ENCOUNTER — Emergency Department
Admission: EM | Admit: 2020-04-06 | Discharge: 2020-04-06 | Disposition: A | Discharge status: Home / Self Care | Payer: PPO | Attending: Emergency Medicine | Admitting: Emergency Medicine

## 2020-04-06 DIAGNOSIS — Z7984 Long term (current) use of oral hypoglycemic drugs: Secondary | ICD-10-CM | POA: Diagnosis not present

## 2020-04-06 DIAGNOSIS — E063 Autoimmune thyroiditis: Secondary | ICD-10-CM | POA: Diagnosis not present

## 2020-04-06 DIAGNOSIS — I10 Essential (primary) hypertension: Secondary | ICD-10-CM | POA: Diagnosis not present

## 2020-04-06 DIAGNOSIS — Z79899 Other long term (current) drug therapy: Secondary | ICD-10-CM | POA: Insufficient documentation

## 2020-04-06 DIAGNOSIS — I150 Renovascular hypertension: Secondary | ICD-10-CM | POA: Diagnosis not present

## 2020-04-06 DIAGNOSIS — E119 Type 2 diabetes mellitus without complications: Secondary | ICD-10-CM | POA: Diagnosis not present

## 2020-04-06 NOTE — ED Triage Notes (Signed)
Pt states coming in for high blood pressures 180s/60s. Pt states she took 3, 0.1mg  clonidine since 11pm last night. Pt denies pain. Pt states being here Friday night for the same reason.

## 2020-04-06 NOTE — Discharge Instructions (Signed)
In the morning take:  Bystolic Olmesartan Clonidine 0.1mg   Check BP 1-2 hours after meds. If BP remains elevated take another 0.1mg  of clonidine   Lunch time take 0.2mg  clonidine   Bed time take 0.2 mg clonidine.  Call Dr. Rockey Situ and Dr. Lucky Cowboy Monday morning. Return to the ER for headache, dizziness, blurry vision, chest pain, or shortness of breath.
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
## 2020-04-06 NOTE — ED Notes (Signed)
RN Discussed with Dr. Alfred Levins reason for pt care. No orders provided at this time.

## 2020-04-07 ENCOUNTER — Encounter: Payer: Self-pay | Admitting: Physician Assistant

## 2020-04-07 ENCOUNTER — Other Ambulatory Visit: Payer: Self-pay

## 2020-04-07 ENCOUNTER — Telehealth: Payer: Self-pay | Admitting: Cardiovascular Disease

## 2020-04-07 ENCOUNTER — Ambulatory Visit: Payer: PPO | Admitting: Physician Assistant

## 2020-04-07 VITALS — BP 160/60 | HR 60 | Ht 61.0 in | Wt 156.0 lb

## 2020-04-07 DIAGNOSIS — I150 Renovascular hypertension: Secondary | ICD-10-CM

## 2020-04-07 DIAGNOSIS — E785 Hyperlipidemia, unspecified: Secondary | ICD-10-CM | POA: Diagnosis not present

## 2020-04-07 DIAGNOSIS — I701 Atherosclerosis of renal artery: Secondary | ICD-10-CM

## 2020-04-07 DIAGNOSIS — K551 Chronic vascular disorders of intestine: Secondary | ICD-10-CM | POA: Diagnosis not present

## 2020-04-07 DIAGNOSIS — I774 Celiac artery compression syndrome: Secondary | ICD-10-CM | POA: Diagnosis not present

## 2020-04-07 DIAGNOSIS — I6523 Occlusion and stenosis of bilateral carotid arteries: Secondary | ICD-10-CM | POA: Diagnosis not present

## 2020-04-07 DIAGNOSIS — I771 Stricture of artery: Secondary | ICD-10-CM

## 2020-04-07 MED ORDER — CLONIDINE HCL 0.2 MG PO TABS: 0.2000 mg | ORAL_TABLET | Freq: Three times a day (TID) | ORAL | 1 refills | Status: DC

## 2020-04-07 NOTE — Telephone Encounter (Signed)
Patient was seen in the Emergency Department on 3/11. States he Clonidine was increased, but has not seen a change in her symptoms.  Pt c/o BP issue: STAT if pt c/o blurred vision, one-sided weakness or slurred speech  1. What are your last 5 BP readings?  3/10-151/ 60, ater 167/60 3/11- 175/50 187/50, then 180/70, 220/70 3/12- 203/60 3/13- 170/70 (this is as low  As it was all day) 3/14-185/70  2. Are you having any other symptoms (ex. Dizziness, headache, blurred vision, passed out)? Headache, chest pain  3. What is your BP issue? Elevated  Pt c/o of Chest Pain: STAT if CP now or developed within 24 hours  1. Are you having CP right now? Chest feels tight  2. Are you experiencing any other symptoms (ex. SOB, nausea, vomiting, sweating)? headache  3. How long have you been experiencing CP? Started today  4. Is your CP continuous or coming and going? continual  5. Have you taken Nitroglycerin? no ?

## 2020-04-07 NOTE — Progress Notes (Signed)
Cardiology Office Note    Date:  04/07/2020   ID:  Yvonne Davidson, DOB 1940/10/07, MRN [id number]  PCP:  Dion Body, MD  Cardiologist:  Ida Rogue, MD  Electrophysiologist:  None   Chief Complaint: ED follow up  History of Present Illness:   Yvonne Davidson is a 80 y.o. female with history of carotid artery disease, DM2, HTN with right-sided RAS, SDH, HLD, OSA, chronic lower extremity pain, depression, anxiety, adjustment disorder, and GERD who presents for ED follow-up of hypertension.  Remote echo from 05/2011 showed an EF of 60% with trace aortic insufficiency, mild mitral regurgitation, and mild tricuspid regurgitation.  Echo from 10/2016 showed an EF of 60 to 65%, no regional wall motion abnormalities, grade 1 diastolic dysfunction, trivial aortic insufficiency, mild mitral regurgitation, and a mildly dilated left atrium.  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  She was referred for further evaluation of these findings though I do not see that she saw anyone for this.  She was last seen in the office in 04/2019 noting increased stress at home.  She continued to note elevated BP in the middle of the night, checking her readings around 3 AM.  Her fluctuating BP was felt to be exacerbated by stressors at home.  Follow-up carotid artery ultrasound in 04/2019 showed 40-59% RICA stenosis, 72-53% LICA stenosis, antegrade flow of the bilateral vertebral arteries and normal flow hemodynamics of the bilateral subclavian arteries.  She was seen in the ED in late 09/2019 and diagnosed with a subdural hematoma followed by neurosurgery.  She was seen again in the ED in 10/2019 with paresthesias involving the left cheek with CT imaging being unrevealing.  It was noted she had  established follow-up with neurosurgery as an outpatient.  She has been evaluated by pulmonology in 12/2019 for shortness of breath.  Echo on 02/07/2020, performed at outside office, showed an EF greater than 55%, normal wall motion, grade 1 diastolic dysfunction, mildly dilated left atrium, mild aortic insufficiency, mild mitral regurgitation, mild tricuspid regurgitation, mild pulmonic regurgitation, normal PASP, and normal RV systolic function as read by outside office.  PFTs are unavailable for review.  She was last seen in the office in 02/2020 noting intermittent substernal chest discomfort and elevated BP.  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  In this setting she was referred to vascular surgery.  She has subsequently been evaluated by them with recommendation to proceed with a staged renal artery angiogram/visceral artery angiogram with possible intervention.  She has delayed scheduling this given concerns regarding contrast and possible sedative medications.  She was seen in the ED on 3/12 and again on 3/13 with concerns of elevated BP bleeding her clonidine to be increased as outlined per their note.  Laboratory work-up was unrevealing including high-sensitivity troponin, BMP, CBC outside of a mild anemia, and UA with a small amount of hemoglobin.  Following her ED visit yesterday she took her regularly scheduled clonidine 0.2 mg in the evening.  In the overnight hours leading into this morning she woke up a couple of times overnight and took an additional 0.1 mg 2 separate times  due to elevated BP.  Initially, following her visit with vascular surgery she was uncertain if she wanted to move forward with renal artery angiogram with possible stenting as well as a visceral angiogram with possible stenting down the road.  She now would like to move forward with this and plans on  contacting their office later today to get this scheduled.  BP readings reviewed from the patient's bedside sphygmomanometer showed most readings in the 384Y to 659D systolic with an occasional reading in the 357S systolic.  She does continue to note abdominal discomfort/indigestion when eating.  No frank chest pain, dyspnea, palpitations, dizziness, presyncope, or syncope.    Labs independently reviewed: 03/2020 - potassium 3.6, BUN 20, serum creatinine 0.74, Hgb 11.3, PLT 231 01/2020 - albumin 4.3, AST/ALT normal, TC 225, TG 256, HDL 39, LDL 135 09/2018 - TSH normal  Past Medical History:  Diagnosis Date  . Allergy   . Anemia   . Anxiety   . Arthritis   . Bilateral cataracts    immature  . Chronic sinusitis    takes Cetirizine daily  . Depression    doesn't take any meds  . Diabetes mellitus without complication (HCC)    borderline  . Diverticulosis   . Dry eyes   . GERD (gastroesophageal reflux disease)    TAKES OMEPRAZOLE DAILY  . Hashimoto's thyroiditis   . History of blood transfusion    in the 60's no abnormal reaction noted  . History of echocardiogram    a. 05/2011: EF 60%, trace AI, trace MR, mild TR  . Hyperlipidemia    a. self discontinued statin  . Hypertension   . Insomnia    takes Trazodone nightly  . Medication intolerance   . OSA (obstructive sleep apnea)    doesn't use a cpap  . Osteoporosis    takes Vit D as needed  . PONV (postoperative nausea and vomiting)    hard to wake up    Past Surgical History:  Procedure Laterality Date  . CHOLECYSTECTOMY  12/07/2012   Dr Lucia Gaskins  . CHOLECYSTECTOMY N/A 12/07/2012   Procedure: LAPAROSCOPIC CHOLECYSTECTOMY WITH INTRAOPERATIVE CHOLANGIOGRAM;  Surgeon: Shann Medal, MD;  Location: [facility];  Service: General;  Laterality: N/A;  . LIPOSUCTION    . NASAL SINUS SURGERY  2006   x 2  . right arm surgery  1960  . TCS      Current Medications: Current Meds  Medication Sig  . Artificial Tear Ointment (REFRESH  LACRI-LUBE) OINT Apply 1 application to eye at bedtime.  Marland Kitchen azelastine (OPTIVAR) 0.05 % ophthalmic solution Place 1 drop into both eyes.  . B-D ULTRA-FINE 33 LANCETS MISC Use as directed. Dx 250.0  . Blood Glucose Monitoring Suppl (ONE TOUCH ULTRA MINI) W/DEVICE KIT USE AS DIRECTED  . BYSTOLIC 10 MG tablet TAKE 1 TABLET BY MOUTH EVERY DAY  . carboxymethylcellulose (REFRESH PLUS) 0.5 % SOLN Place 1 drop into both eyes 3 (three) times daily as needed (for dry eyes).   . cloNIDine (CATAPRES) 0.2 MG tablet Take 1 tablet (0.2 mg total) by mouth 3 (three) times daily.  Marland Kitchen ezetimibe (ZETIA) 10 MG tablet Take 1 tablet by mouth daily.  . metFORMIN (GLUCOPHAGE) 500 MG tablet Take 1 tablet (500 mg total) by mouth 2 (two) times daily with a meal.  . olmesartan (BENICAR) 40 MG tablet Take by mouth.  . Olopatadine HCl (PAZEO) 0.7 % SOLN Place 1 drop into both eyes.  Glory Rosebush DELICA LANCETS 17B MISC  Use as directed. Dx 250.0  . pantoprazole (PROTONIX) 20 MG tablet Take 20 mg by mouth daily.  . [DISCONTINUED] cloNIDine (CATAPRES) 0.1 MG tablet Take 1-2 tablets (0.1-0.2 mg total) by mouth as directed. Take 1/2 tablet (0.005 mg) in the AM, take whole tablet 0.1 mg at Lunch, and take 2 tablets (0.2 mg) at bedtime.    Allergies:   Buprenorphine hcl, Chlorphen-diphenhyd-pe-apap, Codeine, Mold extract [trichophyton mentagrophyte], Morphine and related, Oxycodone, Oxycodone hcl, Oxycodone hcl, Sumycin [tetracycline hcl], Trichophyton, Brompheniramine-pseudoeph, Cardura [doxazosin mesylate], Chlorphen-phenyleph-asa, Decongest-aid [pseudoephedrine], Esomeprazole magnesium, Esomeprazole magnesium, Hydralazine, Hydrochlorothiazide, Indomethacin, Neomycin-bacitracin-polymyxin [bacitracin-neomycin-polymyxin], Prednisone, Procaine hcl, Keflex [cephalexin], Latex, Neomycin-bacitracin zn-polymyx, Pseudoephedrine hcl er, Sudafed pe cold & cough child  [phenylephrine-dm], Telithromycin, and Tetracycline   Social History    Socioeconomic History  . Marital status: Married    Spouse name: Not on file  . Number of children: 4  . Years of education: Not on file  . Highest education level: Not on file  Occupational History  . 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 Smoking status: Never Smoker  . Smokeless tobacco: Never Used  . Tobacco comment: quit in 1978  Vaping Use  . Vaping Use: Never used  Substance and Sexual Activity  . Alcohol use: No    Alcohol/week: 0.0 standard drinks  . Drug use: No  . Sexual activity: Not on file  Other Topics Concern  . Not on file  Social History Narrative   Lives with friend. Has 4 children.   Social Determinants of Health   Financial Resource Strain: Not on file  Food Insecurity: Not on file  Transportation Needs: Not on file  Physical Activity: Not on file  Stress: Not on file  Social Connections: Not on file     Family History:  The patient's family history includes Alcohol abuse in her father; Aneurysm in her mother; Arthritis in her mother; Breast cancer in her mother; Cancer in her mother; Early death in her father; Heart disease in her mother; Hyperlipidemia in her mother; Hypertension in her mother; Stroke in her mother.  ROS:   Review of Systems  Constitutional: Negative for chills, diaphoresis, fever, malaise/fatigue and weight loss.  HENT: Negative for congestion.   Eyes: Negative for discharge and redness.  Respiratory: Negative for cough, sputum production, shortness of breath and wheezing.   Cardiovascular: Negative for chest pain, palpitations, orthopnea, claudication, leg swelling and PND.  Gastrointestinal: Positive for abdominal pain and heartburn. Negative for nausea and vomiting.  Musculoskeletal: Negative for falls and myalgias.  Skin: Negative for rash.  Neurological: Negative for dizziness, tingling, tremors, sensory change, speech change, focal weakness, loss of consciousness and weakness.  Endo/Heme/Allergies: Does  not bruise/bleed easily.  Psychiatric/Behavioral: Negative for substance abuse. The patient is not nervous/anxious.   All other systems reviewed and are negative.    EKGs/Labs/Other Studies Reviewed:    Studies reviewed were summarized above. The additional studies were reviewed today:  2D echo 02/07/2020 (outside office performed and read): EF greater than 55%, normal wall motion, grade 1 diastolic dysfunction, normal RV systolic function, mild AI/MR/PR/TR, normal RV systolic function, normal PASP. __________  2D echo 10/2016: - Left ventricle: The cavity size was normal. There was moderate  concentric hypertrophy. Systolic function was normal. The  estimated ejection fraction was in the range of 60% to 65%. Wall  motion was normal; there were no regional wall motion  abnormalities. Doppler parameters are consistent with abnormal  left ventricular relaxation (grade 1 diastolic dysfunction).  -  Aortic valve: There was trivial regurgitation.  - Mitral valve: There was mild regurgitation.  - Left atrium: The atrium was mildly dilated.  - Pulmonary arteries: Systolic pressure could not be accurately  estimated.  - Pericardium, extracardiac: A trivial pericardial effusion was  identified posterior to the heart.   EKG:  EKG is not ordered today.   Recent Labs: 11/24/2019: ALT 21 04/04/2020: BUN 20; Creatinine, Ser 0.74; Hemoglobin 11.3; Platelets 231; Potassium 3.6; Sodium 136  Recent Lipid Panel    Component Value Date/Time   CHOL 126 03/03/2015 0839   TRIG 62 03/03/2015 0839   HDL 47 03/03/2015 0839   CHOLHDL 2.7 03/03/2015 0839   CHOLHDL 6 01/15/2014 0817   VLDL 39.6 01/15/2014 0817   LDLCALC 67 03/03/2015 0839   LDLDIRECT 156.6 05/17/2012 1544    PHYSICAL EXAM:    VS:  BP (!) 160/60 (BP Location: Right Arm, Patient Position: Sitting, Cuff Size: Normal)   Pulse 60   Ht _0  (1.549 m)   Wt 156 lb (70.8 kg)   SpO2 98%   BMI 29.48 kg/m   BMI: Body mass  index is 29.48 kg/m.  Physical Exam Vitals reviewed.  Constitutional:      Appearance: She is well-developed.  HENT:     Head: Normocephalic and atraumatic.  Eyes:     General:        Right eye: No discharge.        Left eye: No discharge.  Neck:     Vascular: No JVD.  Cardiovascular:     Rate and Rhythm: Normal rate and regular rhythm.     Pulses: No midsystolic click and no opening snap.          Posterior tibial pulses are 2+ on the right side and 2+ on the left side.     Heart sounds: Normal heart sounds, S1 normal and S2 normal. Heart sounds not distant. No murmur heard. No friction rub.  Pulmonary:     Effort: Pulmonary effort is normal. No respiratory distress.     Breath sounds: Normal breath sounds. No decreased breath sounds, wheezing or rales.  Chest:     Chest wall: No tenderness.  Abdominal:     General: There is no distension.     Palpations: Abdomen is soft.     Tenderness: There is no abdominal tenderness.  Musculoskeletal:     Cervical back: Normal range of motion.  Skin:    General: Skin is warm and dry.     Nails: There is no clubbing.  Neurological:     Mental Status: She is alert and oriented to person, place, and time.  Psychiatric:        Speech: Speech normal.        Behavior: Behavior normal.        Thought Content: Thought content normal.        Judgment: Judgment normal.     Wt Readings from Last 3 Encounters:  04/07/20 156 lb (70.8 kg)  04/06/20 156 lb (70.8 kg)  04/04/20 156 lb (70.8 kg)     ASSESSMENT & PLAN:   1. Renovascular hypertension/RAS: Suspect her elevated BP readings are multifactorial in etiology including untreated renal artery stenosis as well as exacerbation from frequent BP checking and significant underlying anxiety.  Initially, she was hesitant on scheduling her renal artery angiogram given her concerns regarding contrast use and sedative medications.  She is now open to scheduling this and has been advised to contact  her vascular surgeon.  Overall this is a difficult situation given her underlying anxiety with frequent BP checks, including in the middle of the night, as well as with noted renal artery stenosis and multiple medication intolerances.  Increase clonidine to 0.2 mg 3 times daily.  Otherwise she will continue current dose of losartan and Bystolic.  Low-sodium diet recommended.  Minimize BP checks, particularly overnight.  2. Mesenteric/celiac artery stenosis: Postprandial pain/indigestion is noted.  Follow-up with vascular surgery as directed.  3. Carotid artery disease: Carotid artery ultrasound from 04/2019 demonstrated 40 to 59% RICA stenosis and 60 to 38% LICA stenosis with slight progression of disease noted along the right side and stable left-sided disease.  She is due for follow-up carotid artery ultrasound in 04/2020.  4. HLD: HLD 135 from 01/2020.  Would consider starting statin therapy though with multiple medication intolerances this will be deferred to her primary cardiologist.  This was not discussed in detail at today's visit.  5. OSA: CPAP.  This was not discussed in detail at today's visit.  Disposition: F/u with Dr. Rockey Situ or an APP in 2 months.   Medication Adjustments/Labs and Tests Ordered: Current medicines are reviewed at length with the patient today.  Concerns regarding medicines are outlined above. Medication changes, Labs and Tests ordered today are summarized above and listed in the Patient Instructions accessible in Encounters.   Signed, Christell Faith, PA-C 04/07/2020 12:46 PM     [address] [place], [place] [address] [phone]

## 2020-04-07 NOTE — Patient Instructions (Signed)
Medication Instructions:  Your physician has recommended you make the following change in your medication:  1- INCREASE Clonidine to 0.2 mg by mouth three times a day.  *If you need a refill on your cardiac medications before your next appointment, please call your pharmacy*   Follow-Up: At [facility], you and your health needs are our priority.  As part of our continuing mission to provide you with exceptional heart care, we have created designated Provider Care Teams.  These Care Teams include your primary Cardiologist (physician) and Advanced Practice Providers (APPs -  Physician Assistants and Nurse Practitioners) who all work together to provide you with the care you need, when you need it.  We recommend signing up for the patient portal called "MyChart".  Sign up information is provided on this After Visit Summary.  MyChart is used to connect with patients for Virtual Visits (Telemedicine).  Patients are able to view lab/test results, encounter notes, upcoming appointments, etc.  Non-urgent messages can be sent to your provider as well.   To learn more about what you can3 do with MyChart, go to [email or web address].    Your next appointment:   2 month(s)  The format for your next appointment:   In Person  Provider:    You may see Ida Rogue, MD or one of the following Advanced Practice Providers on your designated Care Team:    Christell Faith, [place]

## 2020-04-07 NOTE — Telephone Encounter (Signed)
Was able to speak with Yvonne Davidson via phone, she stated she went to the ED twice this weekend for HTN, reports thinks this is coming from her need to have a right renal artery stent and would like a message sent over to vascular surgery to see if they can "speed up the process". Yvonne Davidson stated the ED provider increase her clonidine for her HTN but reports has not improved. Woke up with BP 185/70, she is having chest pressure and headache as well. Yvonne Davidson would like to be seen in clinic as soon as possible for a f/u visit and to discuss her BP and clonidine increase. Was abel to get her scheudle today with Yvonne Dunn, PA-C. Yvonne Davidson seen Davidson 02/26/20 and is okay with seeing him again for evaluation. Appt is at 10am. Advised if symptoms got worse, chest pressure increase, headache worsens, shob, dizziness, or weakness then need to go back to the ED for further evaluation. Yvonne Davidson verbalized understanding, grateful for phone conversation and appt today. Nothing further at this time.   Recent BP readings 3/10-151/ 60, 167/60 3/11- 175/50 187/50, 180/70, 220/70 3/12- 203/60 3/13- 170/70

## 2020-04-07 NOTE — Telephone Encounter (Signed)
See office visit from today 

## 2020-04-08 ENCOUNTER — Telehealth: Payer: Self-pay | Admitting: Physician Assistant

## 2020-04-08 ENCOUNTER — Telehealth (INDEPENDENT_AMBULATORY_CARE_PROVIDER_SITE_OTHER): Payer: Self-pay | Admitting: Vascular Surgery

## 2020-04-08 NOTE — Telephone Encounter (Signed)
In talking with Dr. Lucky Cowboy the patient doesn't need to come in, she was scheduled to have a procedure with him to address her renal stenosis (part of what is causing her high bp), unless she has additional questions.  Depending on Dr. Bunnie Domino schedule and availability  it may be a few weeks before she can get in to speak with him.  Or she can proceed with scheduling the procedure as previously discussed. Dr. Lucky Cowboy also notes that he can also talk to her the morning prior to the procedure if she would like as well.

## 2020-04-08 NOTE — Telephone Encounter (Signed)
Patient is ready to proceed with scheduling the procedure and she is fine with Dr Lucky Cowboy speaking with her the morning prior to the procedure.

## 2020-04-08 NOTE — Telephone Encounter (Signed)
Please proceed with scheduling.

## 2020-04-08 NOTE — Telephone Encounter (Signed)
Spoke to pt. Seen in office yesterday by Christell Faith, PA. Clonidine was incr to 0.2mg  TID.  Pt continues Bystolic 10mg  daily and Olmesartan 40mg  daily in addition.  Regarding BP readings below, advised pt to check BP 2 hours after AM meds and 1-2 hours after evening meds. Advised not to check multiple readings at night to help minimize sleep interruption.  Pt verbalized understanding.  Pt took Clonidine at 12noon and asked pt to check BP now while on phone (2 hours after dose) and BP is 147/44.  Pt denies any additional symptoms at this time. She does report that she is waiting to hear back from Dr. Bunnie Domino office today regarding scheduling renal artery surgery. Pt will notify our office once this has been scheduled.  Otherwise, pt has no further questions or concerns at this time.

## 2020-04-08 NOTE — Telephone Encounter (Signed)
Called stating that she was hospitalized this past weekend due to high bp. Pateint states she went to Dr. Jonne Ply office yesterday and his NP increased clonidine from  0.1 to 0.2 (3x a day). Patient states bp has not come down much since last night here are the readings: 175/72 (before bed). 180/70 this morning when she woke up ; during the night she woke up 207/77 and she would like to come in to talk to [place]. Patient was last seen 03-18-20 as a new patient with JD.

## 2020-04-08 NOTE — Telephone Encounter (Signed)
Pt c/o BP issue: STAT if pt c/o blurred vision, one-sided weakness or slurred speech  1. What are your last 5 BP readings?  3/14 Pm 175/70 3/15 Am 207/77  190/79  190/73 2. Are you having any other symptoms (ex. Dizziness, headache, blurred vision, passed out)? headaches  3. What is your BP issue? High after recent med change yesterday. Patient states she took more clonidine at 1 am and at 4 am.

## 2020-04-09 ENCOUNTER — Telehealth: Payer: Self-pay | Admitting: Physician Assistant

## 2020-04-09 NOTE — Telephone Encounter (Signed)
Spoke with the patient and she is now scheduled for a a right renal artery stent placement with Dr. Lucky Cowboy on 04/14/20 with a 8:45 am arrival time to the [facility]. Covid testing on 04/10/20 between 8-2 pm at the [place]. Pre-procedure instructions were discussed and will be mailed.

## 2020-04-09 NOTE — Telephone Encounter (Signed)
Patient calling in to let nurse know that her stent surgery is scheduled for 04/14/20 at 8:45

## 2020-04-09 NOTE — Telephone Encounter (Signed)
Patient called back this morning to give update that her renal artery stent surgery is scheduled for 04/14/20 at 8:45.  Will forward to provider to make aware.

## 2020-04-09 NOTE — Telephone Encounter (Signed)
Please see telephone note yesterday 3/16 for follow up details. Forwarded to provider to update.

## 2020-04-10 ENCOUNTER — Other Ambulatory Visit
Admission: RE | Admit: 2020-04-10 | Discharge: 2020-04-10 | Disposition: A | Discharge status: Home / Self Care | Payer: PPO | Source: Ambulatory Visit | Attending: Vascular Surgery | Admitting: Vascular Surgery

## 2020-04-10 ENCOUNTER — Other Ambulatory Visit: Payer: Self-pay

## 2020-04-10 DIAGNOSIS — Z20822 Contact with and (suspected) exposure to covid-19: Secondary | ICD-10-CM | POA: Diagnosis not present

## 2020-04-10 DIAGNOSIS — G4730 Sleep apnea, unspecified: Secondary | ICD-10-CM | POA: Diagnosis not present

## 2020-04-10 DIAGNOSIS — Z01818 Encounter for other preprocedural examination: Secondary | ICD-10-CM | POA: Diagnosis not present

## 2020-04-10 DIAGNOSIS — Z9989 Dependence on other enabling machines and devices: Secondary | ICD-10-CM | POA: Insufficient documentation

## 2020-04-10 DIAGNOSIS — Z01812 Encounter for preprocedural laboratory examination: Secondary | ICD-10-CM | POA: Insufficient documentation

## 2020-04-10 LAB — SARS CORONAVIRUS 2 (TAT 6-24 HRS): SARS Coronavirus 2: NEGATIVE

## 2020-04-10 NOTE — Telephone Encounter (Signed)
Noted  

## 2020-04-10 NOTE — Telephone Encounter (Signed)
Patient called stating that she has questions/concerns she would like to ask JD before procedure. LG/FB have both spoken to patient on concerns. Patient was last seen 03-18-20 as a new patient (JD)  This note is for documentation purposes only.

## 2020-04-11 ENCOUNTER — Encounter (INDEPENDENT_AMBULATORY_CARE_PROVIDER_SITE_OTHER): Payer: Self-pay | Admitting: Vascular Surgery

## 2020-04-11 ENCOUNTER — Ambulatory Visit (INDEPENDENT_AMBULATORY_CARE_PROVIDER_SITE_OTHER): Payer: PPO | Admitting: Vascular Surgery

## 2020-04-11 VITALS — BP 161/67 | HR 53 | Ht 61.0 in | Wt 157.0 lb

## 2020-04-11 DIAGNOSIS — E785 Hyperlipidemia, unspecified: Secondary | ICD-10-CM | POA: Diagnosis not present

## 2020-04-11 DIAGNOSIS — E119 Type 2 diabetes mellitus without complications: Secondary | ICD-10-CM | POA: Diagnosis not present

## 2020-04-11 DIAGNOSIS — I10 Essential (primary) hypertension: Secondary | ICD-10-CM

## 2020-04-11 DIAGNOSIS — K551 Chronic vascular disorders of intestine: Secondary | ICD-10-CM | POA: Diagnosis not present

## 2020-04-11 DIAGNOSIS — I701 Atherosclerosis of renal artery: Secondary | ICD-10-CM | POA: Diagnosis not present

## 2020-04-11 NOTE — Progress Notes (Signed)
  MRN : [id number]  Yvonne Davidson is a 79 y.o. (12/29/1940) female who presents with chief complaint of  Chief Complaint  Patient presents with  . Follow-up    Add on per phone note discuss surgery   .  History of Present Illness: Patient returns today in follow up prior to her renal artery angiogram and possible intervention scheduled for next week due to questions about the procedure and the disease process.  She has discussed this with her primary care physician as well who thinks it is a good idea.  Her cardiologist saw her Monday.  She is now inclined to proceed with renal angiogram and possible stent placement, but does have a number of questions before she wants to proceed.  Her blood pressure continues to be poorly controlled and quite high prompting emergency room visits twice since her last visit.  Current Outpatient Medications  Medication Sig Dispense Refill  . Artificial Tear Ointment (REFRESH LACRI-LUBE) OINT Apply 1 application to eye at bedtime.    . azelastine (OPTIVAR) 0.05 % ophthalmic solution Place 1 drop into both eyes.    . B-D ULTRA-FINE 33 LANCETS MISC Use as directed. Dx 250.0    . Blood Glucose Monitoring Suppl (ONE TOUCH ULTRA MINI) W/DEVICE KIT USE AS DIRECTED 1 each 0  . BYSTOLIC 10 MG tablet TAKE 1 TABLET BY MOUTH EVERY DAY 90 tablet 3  . carboxymethylcellulose (REFRESH PLUS) 0.5 % SOLN Place 1 drop into both eyes 3 (three) times daily as needed (for dry eyes).     . cloNIDine (CATAPRES) 0.2 MG tablet Take 1 tablet (0.2 mg total) by mouth 3 (three) times daily. 270 tablet 1  . ezetimibe (ZETIA) 10 MG tablet Take 1 tablet by mouth daily.    . metFORMIN (GLUCOPHAGE) 500 MG tablet Take 1 tablet (500 mg total) by mouth 2 (two) times daily with a meal. 60 tablet 3  . olmesartan (BENICAR) 40 MG tablet Take by mouth.    . Olopatadine HCl (PAZEO) 0.7 % SOLN Place 1 drop into both eyes.    . ONETOUCH DELICA LANCETS 33G MISC Use as directed. Dx 250.0 100 each 3  .  pantoprazole (PROTONIX) 20 MG tablet Take 20 mg by mouth daily.     No current facility-administered medications for this visit.    Past Medical History:  Diagnosis Date  . Allergy   . Anemia   . Anxiety   . Arthritis   . Bilateral cataracts    immature  . Chronic sinusitis    takes Cetirizine daily  . Depression    doesn't take any meds  . Diabetes mellitus without complication (HCC)    borderline  . Diverticulosis   . Dry eyes   . GERD (gastroesophageal reflux disease)    TAKES OMEPRAZOLE DAILY  . Hashimoto's thyroiditis   . History of blood transfusion    in the 60's no abnormal reaction noted  . History of echocardiogram    a. 05/2011: EF 60%, trace AI, trace MR, mild TR  . Hyperlipidemia    a. self discontinued statin  . Hypertension   . Insomnia    takes Trazodone nightly  . Medication intolerance   . OSA (obstructive sleep apnea)    doesn't use a cpap  . Osteoporosis    takes Vit D as needed  . PONV (postoperative nausea and vomiting)    hard to wake up    Past Surgical History:  Procedure Laterality Date  . CHOLECYSTECTOMY    12/07/2012   Dr Lucia Gaskins  . CHOLECYSTECTOMY N/A 12/07/2012   Procedure: LAPAROSCOPIC CHOLECYSTECTOMY WITH INTRAOPERATIVE CHOLANGIOGRAM;  Surgeon: Shann Medal, MD;  Location: [facility];  Service: General;  Laterality: N/A;  . LIPOSUCTION    . NASAL SINUS SURGERY  2006   x 2  . right arm surgery  1960  . TCS       Social History   Tobacco Use  . Smoking status: Never Smoker  . Smokeless tobacco: Never Used  . Tobacco comment: quit in 1978  Vaping Use  . Vaping Use: Never used  Substance Use Topics  . Alcohol use: No    Alcohol/week: 0.0 standard drinks  . Drug use: No      Family History  Problem Relation Age of Onset  . Arthritis Mother   . Heart disease Mother   . Hyperlipidemia Mother   . Hypertension Mother   . Stroke Mother   . Cancer Mother        Breast & Uterine - 20'-30's  . Aneurysm Mother   . Breast  cancer Mother   . Early death Father        Fire  . Alcohol abuse Father      Allergies  Allergen Reactions  . Buprenorphine Hcl Anaphylaxis    "Cant breath when I take it"  . Chlorphen-Diphenhyd-Pe-Apap     Other reaction(s): Other (See Comments) Shortness of breath  . Codeine Shortness Of Breath  . Mold Extract [Trichophyton Mentagrophyte] Shortness Of Breath  . Morphine And Related Anaphylaxis    "Cant breath when I take it"  . Oxycodone Shortness Of Breath  . Oxycodone Hcl Anaphylaxis    ALL NARCOTICS  . Oxycodone Hcl Anaphylaxis    ALL NARCOTICS  . Sumycin [Tetracycline Hcl] Anaphylaxis  . Trichophyton Shortness Of Breath  . Brompheniramine-Pseudoeph Other (See Comments)    Other reaction(s): SHORTNESS OF BREATH  . Cardura [Doxazosin Mesylate]     Palpitations   . Chlorphen-Phenyleph-Asa     Other reaction(s): Other (See Comments) Shortness of breath  . Decongest-Aid [Pseudoephedrine]   . Esomeprazole Magnesium Other (See Comments)    Stomach issues  . Esomeprazole Magnesium     Other reaction(s): Other (See Comments) Stomach issues  . Hydralazine     Palpitations   . Hydrochlorothiazide Other (See Comments)    pancreatitis  . Indomethacin Other (See Comments)    disorientation  . Neomycin-Bacitracin-Polymyxin [Bacitracin-Neomycin-Polymyxin] Other (See Comments)    Other reaction(s): RASH  . Prednisone Hypertension  . Procaine Hcl Other (See Comments)    TACHYCARDIA   . Keflex [Cephalexin] Rash    Unknown, allergic to a lot of antibiotics  . Latex Rash    Breaks out Breaks out  . Neomycin-Bacitracin Zn-Polymyx Rash  . Pseudoephedrine Hcl Er Palpitations  . Sudafed Pe Cold & Cough Child  [Phenylephrine-Dm] Palpitations  . Telithromycin Rash  . Tetracycline Rash     REVIEW OF SYSTEMS (Negative unless checked)  Constitutional: []?Weight loss  []?Fever  []?Chills Cardiac: []?Chest pain   []?Chest pressure   []?Palpitations   []?Shortness of breath  when laying flat   []?Shortness of breath at rest   []?Shortness of breath with exertion. Vascular:  []?Pain in legs with walking   []?Pain in legs at rest   []?Pain in legs when laying flat   []?Claudication   []?Pain in feet when walking  []?Pain in feet at rest  []?Pain in feet when laying flat   []?History of DVT   []?  Phlebitis   [x]?Swelling in legs   []?Varicose veins   []?Non-healing ulcers Pulmonary:   []?Uses home oxygen   []?Productive cough   []?Hemoptysis   []?Wheeze  []?COPD   [x]?Asthma Neurologic:  []?Dizziness  []?Blackouts   []?Seizures   []?History of stroke   []?History of TIA  []?Aphasia   []?Temporary blindness   []?Dysphagia   []?Weakness or numbness in arms   []?Weakness or numbness in legs Musculoskeletal:  [x]?Arthritis   []?Joint swelling   [x]?Joint pain   []?Low back pain Hematologic:  []?Easy bruising  []?Easy bleeding   []?Hypercoagulable state   [x]?Anemic  []?Hepatitis Gastrointestinal:  []?Blood in stool   []?Vomiting blood  [x]?Gastroesophageal reflux/heartburn   []?Abdominal pain Genitourinary:  []?Chronic kidney disease   []?Difficult urination  []?Frequent urination  []?Burning with urination   []?Hematuria Skin:  []?Rashes   []?Ulcers   []?Wounds Psychological:  [x]?History of anxiety   []? History of major depression.  Physical Examination  BP (!) 161/67   Pulse (!) 53   Ht 5' 1" (1.549 m)   Wt 157 lb (71.2 kg)   BMI 29.66 kg/m  Gen:  WD/WN, NAD. Appears younger than stated age. Head: [place]/AT, No temporalis wasting. Ear/Nose/Throat: Hearing grossly intact, nares w/o erythema or drainage Eyes: Conjunctiva clear. Sclera non-icteric Neck: Supple.  Trachea midline Pulmonary:  Good air movement, no use of accessory muscles.  Cardiac: RRR, no JVD Vascular:  Vessel Right Left  Radial Palpable Palpable               Gastrointestinal: soft, non-tender/non-distended. No guarding/reflex.  Musculoskeletal: M/S 5/5 throughout.  No deformity or atrophy. No  edema. Neurologic: Sensation grossly intact in extremities.  Symmetrical.  Speech is fluent.  Psychiatric: Judgment intact, Mood & affect appropriate for pt's clinical situation. Dermatologic: No rashes or ulcers noted.  No cellulitis or open wounds.       Labs Recent Results (from the past 2160 hour(s))  CBC     Status: Abnormal   Collection Time: 04/04/20 11:46 PM  Result Value Ref Range   WBC 7.7 4.0 - 10.5 K/uL   RBC 3.77 (L) 3.87 - 5.11 MIL/uL   Hemoglobin 11.3 (L) 12.0 - 15.0 g/dL   HCT 34.6 (L) 36.0 - 46.0 %   MCV 91.8 80.0 - 100.0 fL   MCH 30.0 26.0 - 34.0 pg   MCHC 32.7 30.0 - 36.0 g/dL   RDW 13.2 11.5 - 15.5 %   Platelets 231 150 - 400 K/uL   nRBC 0.0 0.0 - 0.2 %    Comment: Performed at [facility], [address] [place],  [address]  Basic metabolic panel     Status: Abnormal   Collection Time: 04/04/20 11:46 PM  Result Value Ref Range   Sodium 136 135 - 145 mmol/L   Potassium 3.6 3.5 - 5.1 mmol/L   Chloride 106 98 - 111 mmol/L   CO2 22 22 - 32 mmol/L   Glucose, Bld 129 (H) 70 - 99 mg/dL    Comment: Glucose reference range applies only to samples taken after fasting for at least 8 hours.   BUN 20 8 - 23 mg/dL   Creatinine, Ser 0.74 0.44 - 1.00 mg/dL   Calcium 8.7 (L) 8.9 - 10.3 mg/dL   GFR, Estimated >60 >60 mL/min    Comment: (NOTE) Calculated using the CKD-EPI Creatinine Equation (2021)    Anion gap 8 5 - 15    Comment: Performed at [facility],   [address] [place], [place] [address]  Urinalysis, Routine w reflex microscopic     Status: Abnormal   Collection Time: 04/04/20 11:46 PM  Result Value Ref Range   Color, Urine STRAW (A) YELLOW   APPearance CLEAR (A) CLEAR   Specific Gravity, Urine 1.005 1.005 - 1.030   pH 5.0 5.0 - 8.0   Glucose, UA NEGATIVE NEGATIVE mg/dL   Hgb urine dipstick SMALL (A) NEGATIVE   Bilirubin Urine NEGATIVE NEGATIVE   Ketones, ur NEGATIVE NEGATIVE mg/dL   Protein, ur NEGATIVE NEGATIVE mg/dL    Nitrite NEGATIVE NEGATIVE   Leukocytes,Ua NEGATIVE NEGATIVE   RBC / HPF 0-5 0 - 5 RBC/hpf   WBC, UA 0-5 0 - 5 WBC/hpf   Bacteria, UA NONE SEEN NONE SEEN   Squamous Epithelial / LPF 0-5 0 - 5    Comment: Performed at [facility], [address] [place], [place] [address]  Troponin I (High Sensitivity)     Status: None   Collection Time: 04/04/20 11:46 PM  Result Value Ref Range   Troponin I (High Sensitivity) 8 <18 ng/L    Comment: (NOTE) Elevated high sensitivity troponin I (hsTnI) values and significant  changes across serial measurements may suggest ACS but many other  chronic and acute conditions are known to elevate hsTnI results.  Refer to the "Links" section for chest pain algorithms and additional  guidance. Performed at [facility], [address] [place], [place] [address]   SARS CORONAVIRUS 2 (TAT 6-24 HRS) Nasopharyngeal Nasopharyngeal Swab     Status: None   Collection Time: 04/10/20 12:08 PM   Specimen: Nasopharyngeal Swab  Result Value Ref Range   SARS Coronavirus 2 NEGATIVE NEGATIVE    Comment: (NOTE) SARS-CoV-2 target nucleic acids are NOT DETECTED.  The SARS-CoV-2 RNA is generally detectable in upper and lower respiratory specimens during the acute phase of infection. Negative results do not preclude SARS-CoV-2 infection, do not rule out co-infections with other pathogens, and should not be used as the sole basis for treatment or other patient management decisions. Negative results must be combined with clinical observations, patient history, and epidemiological information. The expected result is Negative.  Fact Sheet for Patients: https://www.fda.gov/media/138098/download  Fact Sheet for Healthcare Providers: https://www.fda.gov/media/138095/download  This test is not yet approved or cleared by the [place] FDA and  has been authorized for detection and/or diagnosis of SARS-CoV-2 by FDA under an Emergency Use  Authorization (EUA). This EUA will remain  in effect (meaning this test can be used) for the duration of the COVID-19 declaration under Se ction 564(b)(1) of the Act, 21 U.S.C. section 360bbb-3(b)(1), unless the authorization is terminated or revoked sooner.  Performed at [facility], [address] [place], [place] [address]     Radiology No results found.  Assessment/Plan Diabetes mellitus type 2, controlled, without complications (HCC) blood glucose control important in reducing the progression of atherosclerotic disease. Also, involved in wound healing. On appropriate medications.   Hyperlipidemia LDL goal <70 lipid control important in reducing the progression of atherosclerotic disease. Continue statin therapy   Hypertension goal BP (blood pressure) < 140/90 Potentially renovascular.  We discussed the importance of blood pressure control and reducing vascular disease.  Renal artery evaluation and possible intervention planned as below.  Superior mesenteric artery stenosis (HCC) Her renal artery duplex performed at her cardiologist office demonstrates elevated velocities in the right renal artery that I would interpret as a potentially hemodynamically significant right renal artery stenosis   although the official return potation is 1 to 59% right renal artery stenosis.  The left renal artery velocities were within the normal range.  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  She does describe abdominal pain that is postprandial.  This could potentially be some degree of mesenteric ischemia on a chronic basis.  She has not had unintentional weight loss and does not really have food fear.  We are going to address renal artery issue first but may be able to image her visceral vessels as well while we are doing the angiogram.  We told her that we could not intervene on  both beds at the same time.  Renal artery stenosis (HCC) We again went over the pathophysiology and natural history of renal artery stenosis and why it causes blood pressure issues.  We again recommended renal artery angiogram with possible stent placement.  The patient had all of her questions answered today.  She is now agreeable to proceed with renal artery angiogram with possible stent placement.  Risks and benefits were discussed.    Leotis Pain, MD  04/11/2020 11:05 AM    This note was created with Dragon medical transcription system.  Any errors from dictation are purely unintentional

## 2020-04-11 NOTE — Assessment & Plan Note (Signed)
We again went over the pathophysiology and natural history of renal artery stenosis and why it causes blood pressure issues.  We again recommended renal artery angiogram with possible stent placement.  The patient had all of her questions answered today.  She is now agreeable to proceed with renal artery angiogram with possible stent placement.  Risks and benefits were discussed.

## 2020-04-11 NOTE — H&P (View-Only) (Signed)
MRN : [id number]  Yvonne Davidson is a 80 y.o. (1940-05-29) female who presents with chief complaint of  Chief Complaint  Patient presents with  . Follow-up    Add on per phone note discuss surgery   .  History of Present Illness: Patient returns today in follow up prior to her renal artery angiogram and possible intervention scheduled for next week due to questions about the procedure and the disease process.  She has discussed this with her primary care physician as well who thinks it is a good idea.  Her cardiologist saw her Monday.  She is now inclined to proceed with renal angiogram and possible stent placement, but does have a number of questions before she wants to proceed.  Her blood pressure continues to be poorly controlled and quite high prompting emergency room visits twice since her last visit.  Current Outpatient Medications  Medication Sig Dispense Refill  . Artificial Tear Ointment (REFRESH LACRI-LUBE) OINT Apply 1 application to eye at bedtime.    Marland Kitchen azelastine (OPTIVAR) 0.05 % ophthalmic solution Place 1 drop into both eyes.    . B-D ULTRA-FINE 33 LANCETS MISC Use as directed. Dx 250.0    . Blood Glucose Monitoring Suppl (ONE TOUCH ULTRA MINI) W/DEVICE KIT USE AS DIRECTED 1 each 0  . BYSTOLIC 10 MG tablet TAKE 1 TABLET BY MOUTH EVERY DAY 90 tablet 3  . carboxymethylcellulose (REFRESH PLUS) 0.5 % SOLN Place 1 drop into both eyes 3 (three) times daily as needed (for dry eyes).     . cloNIDine (CATAPRES) 0.2 MG tablet Take 1 tablet (0.2 mg total) by mouth 3 (three) times daily. 270 tablet 1  . ezetimibe (ZETIA) 10 MG tablet Take 1 tablet by mouth daily.    . metFORMIN (GLUCOPHAGE) 500 MG tablet Take 1 tablet (500 mg total) by mouth 2 (two) times daily with a meal. 60 tablet 3  . olmesartan (BENICAR) 40 MG tablet Take by mouth.    . Olopatadine HCl (PAZEO) 0.7 % SOLN Place 1 drop into both eyes.    Glory Rosebush DELICA LANCETS 00X MISC Use as directed. Dx 250.0 100 each 3  .  pantoprazole (PROTONIX) 20 MG tablet Take 20 mg by mouth daily.     No current facility-administered medications for this visit.    Past Medical History:  Diagnosis Date  . Allergy   . Anemia   . Anxiety   . Arthritis   . Bilateral cataracts    immature  . Chronic sinusitis    takes Cetirizine daily  . Depression    doesn't take any meds  . Diabetes mellitus without complication (HCC)    borderline  . Diverticulosis   . Dry eyes   . GERD (gastroesophageal reflux disease)    TAKES OMEPRAZOLE DAILY  . Hashimoto's thyroiditis   . History of blood transfusion    in the 60's no abnormal reaction noted  . History of echocardiogram    a. 05/2011: EF 60%, trace AI, trace MR, mild TR  . Hyperlipidemia    a. self discontinued statin  . Hypertension   . Insomnia    takes Trazodone nightly  . Medication intolerance   . OSA (obstructive sleep apnea)    doesn't use a cpap  . Osteoporosis    takes Vit D as needed  . PONV (postoperative nausea and vomiting)    hard to wake up    Past Surgical History:  Procedure Laterality Date  . CHOLECYSTECTOMY  12/07/2012   Dr Lucia Gaskins  . CHOLECYSTECTOMY N/A 12/07/2012   Procedure: LAPAROSCOPIC CHOLECYSTECTOMY WITH INTRAOPERATIVE CHOLANGIOGRAM;  Surgeon: Shann Medal, MD;  Location: [facility];  Service: General;  Laterality: N/A;  . LIPOSUCTION    . NASAL SINUS SURGERY  2006   x 2  . right arm surgery  1960  . TCS       Social History   Tobacco Use  . Smoking status: Never Smoker  . Smokeless tobacco: Never Used  . Tobacco comment: quit in 1978  Vaping Use  . Vaping Use: Never used  Substance Use Topics  . Alcohol use: No    Alcohol/week: 0.0 standard drinks  . Drug use: No      Family History  Problem Relation Age of Onset  . Arthritis Mother   . Heart disease Mother   . Hyperlipidemia Mother   . Hypertension Mother   . Stroke Mother   . Cancer Mother        Breast & Uterine - 20'-30's  . Aneurysm Mother   . Breast  cancer Mother   . Early death Father        Fire  . Alcohol abuse Father      Allergies  Allergen Reactions  . Buprenorphine Hcl Anaphylaxis    "Cant breath when I take it"  . Chlorphen-Diphenhyd-Pe-Apap     Other reaction(s): Other (See Comments) Shortness of breath  . Codeine Shortness Of Breath  . Mold Extract [Trichophyton Mentagrophyte] Shortness Of Breath  . Morphine And Related Anaphylaxis    "Cant breath when I take it"  . Oxycodone Shortness Of Breath  . Oxycodone Hcl Anaphylaxis    ALL NARCOTICS  . Oxycodone Hcl Anaphylaxis    ALL NARCOTICS  . Sumycin [Tetracycline Hcl] Anaphylaxis  . Trichophyton Shortness Of Breath  . Brompheniramine-Pseudoeph Other (See Comments)    Other reaction(s): SHORTNESS OF BREATH  . Cardura [Doxazosin Mesylate]     Palpitations   . Chlorphen-Phenyleph-Asa     Other reaction(s): Other (See Comments) Shortness of breath  . Decongest-Aid [Pseudoephedrine]   . Esomeprazole Magnesium Other (See Comments)    Stomach issues  . Esomeprazole Magnesium     Other reaction(s): Other (See Comments) Stomach issues  . Hydralazine     Palpitations   . Hydrochlorothiazide Other (See Comments)    pancreatitis  . Indomethacin Other (See Comments)    disorientation  . Neomycin-Bacitracin-Polymyxin [Bacitracin-Neomycin-Polymyxin] Other (See Comments)    Other reaction(s): RASH  . Prednisone Hypertension  . Procaine Hcl Other (See Comments)    TACHYCARDIA   . Keflex [Cephalexin] Rash    Unknown, allergic to a lot of antibiotics  . Latex Rash    Breaks out Breaks out  . Neomycin-Bacitracin Zn-Polymyx Rash  . Pseudoephedrine Hcl Er Palpitations  . Sudafed Pe Cold & Cough Child  [Phenylephrine-Dm] Palpitations  . Telithromycin Rash  . Tetracycline Rash     REVIEW OF SYSTEMS (Negative unless checked)  Constitutional: []?Weight loss  []?Fever  []?Chills Cardiac: []?Chest pain   []?Chest pressure   []?Palpitations   []?Shortness of breath  when laying flat   []?Shortness of breath at rest   []?Shortness of breath with exertion. Vascular:  []?Pain in legs with walking   []?Pain in legs at rest   []?Pain in legs when laying flat   []?Claudication   []?Pain in feet when walking  []?Pain in feet at rest  []?Pain in feet when laying flat   []?History of DVT   []?  Phlebitis   _0 ?Swelling in legs   _1 ?Varicose veins   _2 ?Non-healing ulcers Pulmonary:   _3 ?Uses home oxygen   _4 ?Productive cough   _5 ?Hemoptysis   _6 ?Wheeze  _7 ?COPD   _8 ?Asthma Neurologic:  _9 ?Dizziness  _10 ?Blackouts   _11 ?Seizures   _12 ?History of stroke   _13 ?History of TIA  _14 ?Aphasia   _15 ?Temporary blindness   _16 ?Dysphagia   _17 ?Weakness or numbness in arms   _18 ?Weakness or numbness in legs Musculoskeletal:  _19 ?Arthritis   _20 ?Joint swelling   _21 ?Joint pain   _22 ?Low back pain Hematologic:  _23 ?Easy bruising  _24 ?Easy bleeding   _25 ?Hypercoagulable state   _26 ?Anemic  _27 ?Hepatitis Gastrointestinal:  _28 ?Blood in stool   _29 ?Vomiting blood  _30 ?Gastroesophageal reflux/heartburn   _31 ?Abdominal pain Genitourinary:  _32 ?Chronic kidney disease   _33 ?Difficult urination  _34 ?Frequent urination  _35 ?Burning with urination   _36 ?Hematuria Skin:  _37 ?Rashes   _38 ?Ulcers   _39 ?Wounds Psychological:  _40 ?History of anxiety   _41 ? History of major depression.  Physical Examination  BP (!) 161/67   Pulse (!) 53   Ht _42  (1.549 m)   Wt 157 lb (71.2 kg)   BMI 29.66 kg/m  Gen:  WD/WN, NAD. Appears younger than stated age. Head: [place]/AT, No temporalis wasting. Ear/Nose/Throat: Hearing grossly intact, nares w/o erythema or drainage Eyes: Conjunctiva clear. Sclera non-icteric Neck: Supple.  Trachea midline Pulmonary:  Good air movement, no use of accessory muscles.  Cardiac: RRR, no JVD Vascular:  Vessel Right Left  Radial Palpable Palpable               Gastrointestinal: soft, non-tender/non-distended. No guarding/reflex.  Musculoskeletal: M/S 5/5 throughout.  No deformity or atrophy. No  edema. Neurologic: Sensation grossly intact in extremities.  Symmetrical.  Speech is fluent.  Psychiatric: Judgment intact, Mood & affect appropriate for pt's clinical situation. Dermatologic: No rashes or ulcers noted.  No cellulitis or open wounds.       Labs Recent Results (from the past 2160 hour(s))  CBC     Status: Abnormal   Collection Time: 04/04/20 11:46 PM  Result Value Ref Range   WBC 7.7 4.0 - 10.5 K/uL   RBC 3.77 (L) 3.87 - 5.11 MIL/uL   Hemoglobin 11.3 (L) 12.0 - 15.0 g/dL   HCT 34.6 (L) 36.0 - 46.0 %   MCV 91.8 80.0 - 100.0 fL   MCH 30.0 26.0 - 34.0 pg   MCHC 32.7 30.0 - 36.0 g/dL   RDW 13.2 11.5 - 15.5 %   Platelets 231 150 - 400 K/uL   nRBC 0.0 0.0 - 0.2 %    Comment: Performed at [facility], [address] [place], [place] [address]  Basic metabolic panel     Status: Abnormal   Collection Time: 04/04/20 11:46 PM  Result Value Ref Range   Sodium 136 135 - 145 mmol/L   Potassium 3.6 3.5 - 5.1 mmol/L   Chloride 106 98 - 111 mmol/L   CO2 22 22 - 32 mmol/L   Glucose, Bld 129 (H) 70 - 99 mg/dL    Comment: Glucose reference range applies only to samples taken after fasting for at least 8 hours.   BUN 20 8 - 23 mg/dL   Creatinine, Ser 0.74 0.44 - 1.00 mg/dL   Calcium 8.7 (L) 8.9 - 10.3 mg/dL   GFR, Estimated >60 >60 mL/min    Comment: (NOTE) Calculated using the CKD-EPI Creatinine Equation (2021)    Anion gap 8 5 - 15    Comment: Performed at [facility],  [place], [place] [address]  Urinalysis, Routine w reflex microscopic     Status: Abnormal   Collection Time: 04/04/20 11:46 PM  Result Value Ref Range   Color, Urine STRAW (A) YELLOW   APPearance CLEAR (A) CLEAR   Specific Gravity, Urine 1.005 1.005 - 1.030   pH 5.0 5.0 - 8.0   Glucose, UA NEGATIVE NEGATIVE mg/dL   Hgb urine dipstick SMALL (A) NEGATIVE   Bilirubin Urine NEGATIVE NEGATIVE   Ketones, ur NEGATIVE NEGATIVE mg/dL   Protein, ur NEGATIVE NEGATIVE mg/dL    Nitrite NEGATIVE NEGATIVE   Leukocytes,Ua NEGATIVE NEGATIVE   RBC / HPF 0-5 0 - 5 RBC/hpf   WBC, UA 0-5 0 - 5 WBC/hpf   Bacteria, UA NONE SEEN NONE SEEN   Squamous Epithelial / LPF 0-5 0 - 5    Comment: Performed at [facility], [place], [place] [address]  Troponin I (High Sensitivity)     Status: None   Collection Time: 04/04/20 11:46 PM  Result Value Ref Range   Troponin I (High Sensitivity) 8 <18 ng/L    Comment: (NOTE) Elevated high sensitivity troponin I (hsTnI) values and significant  changes across serial measurements may suggest ACS but many other  chronic and acute conditions are known to elevate hsTnI results.  Refer to the "Links" section for chest pain algorithms and additional  guidance. Performed at [facility], [place], [place] [address]   SARS CORONAVIRUS 2 (TAT 6-24 HRS) Nasopharyngeal Nasopharyngeal Swab     Status: None   Collection Time: 04/10/20 12:08 PM   Specimen: Nasopharyngeal Swab  Result Value Ref Range   SARS Coronavirus 2 NEGATIVE NEGATIVE    Comment: (NOTE) SARS-CoV-2 target nucleic acids are NOT DETECTED.  The SARS-CoV-2 RNA is generally detectable in upper and lower respiratory specimens during the acute phase of infection. Negative results do not preclude SARS-CoV-2 infection, do not rule out co-infections with other pathogens, and should not be used as the sole basis for treatment or other patient management decisions. Negative results must be combined with clinical observations, patient history, and epidemiological information. The expected result is Negative.  Fact Sheet for Patients: SugarRoll.be  Fact Sheet for Healthcare Providers: https://www.woods-mathews.com/  This test is not yet approved or cleared by the [place] FDA and  has been authorized for detection and/or diagnosis of SARS-CoV-2 by FDA under an Emergency Use  Authorization (EUA). This EUA will remain  in effect (meaning this test can be used) for the duration of the COVID-19 declaration under Se ction 564(b)(1) of the Act, 21 U.S.C. section 360bbb-3(b)(1), unless the authorization is terminated or revoked sooner.  Performed at [facility], [place] [address] [place], [place] [address]     Radiology No results found.  Assessment/Plan Diabetes mellitus type 2, controlled, without complications (HCC) blood glucose control important in reducing the progression of atherosclerotic disease. Also, involved in wound healing. On appropriate medications.   Hyperlipidemia LDL goal <70 lipid control important in reducing the progression of atherosclerotic disease. Continue statin therapy   Hypertension goal BP (blood pressure) < 140/90 Potentially renovascular.  We discussed the importance of blood pressure control and reducing vascular disease.  Renal artery evaluation and possible intervention planned as below.  Superior mesenteric artery stenosis (HCC) Her renal artery duplex performed at her cardiologist office demonstrates elevated velocities in the right renal artery that I would interpret as a potentially hemodynamically significant right renal artery stenosis  although the official return potation is 1 to 59% right renal artery stenosis.  The left renal artery velocities were within the normal range.  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  She does describe abdominal pain that is postprandial.  This could potentially be some degree of mesenteric ischemia on a chronic basis.  She has not had unintentional weight loss and does not really have food fear.  We are going to address renal artery issue first but may be able to image her visceral vessels as well while we are doing the angiogram.  We told her that we could not intervene on  both beds at the same time.  Renal artery stenosis (HCC) We again went over the pathophysiology and natural history of renal artery stenosis and why it causes blood pressure issues.  We again recommended renal artery angiogram with possible stent placement.  The patient had all of her questions answered today.  She is now agreeable to proceed with renal artery angiogram with possible stent placement.  Risks and benefits were discussed.    Leotis Pain, MD  04/11/2020 11:05 AM    This note was created with Dragon medical transcription system.  Any errors from dictation are purely unintentional

## 2020-04-14 ENCOUNTER — Other Ambulatory Visit (INDEPENDENT_AMBULATORY_CARE_PROVIDER_SITE_OTHER): Payer: Self-pay | Admitting: Nurse Practitioner

## 2020-04-14 ENCOUNTER — Other Ambulatory Visit: Payer: Self-pay

## 2020-04-14 ENCOUNTER — Encounter: Payer: Self-pay | Admitting: Vascular Surgery

## 2020-04-14 ENCOUNTER — Encounter
Admission: RE | Disposition: A | Discharge status: Home / Self Care | Payer: Self-pay | Source: Home / Self Care | Attending: Vascular Surgery

## 2020-04-14 ENCOUNTER — Ambulatory Visit
Admission: RE | Admit: 2020-04-14 | Discharge: 2020-04-14 | Disposition: A | Discharge status: Home / Self Care | Payer: PPO | Attending: Vascular Surgery | Admitting: Vascular Surgery

## 2020-04-14 DIAGNOSIS — E119 Type 2 diabetes mellitus without complications: Secondary | ICD-10-CM | POA: Diagnosis not present

## 2020-04-14 DIAGNOSIS — Z888 Allergy status to other drugs, medicaments and biological substances status: Secondary | ICD-10-CM | POA: Insufficient documentation

## 2020-04-14 DIAGNOSIS — Z91040 Latex allergy status: Secondary | ICD-10-CM | POA: Diagnosis not present

## 2020-04-14 DIAGNOSIS — I10 Essential (primary) hypertension: Secondary | ICD-10-CM | POA: Insufficient documentation

## 2020-04-14 DIAGNOSIS — I771 Stricture of artery: Secondary | ICD-10-CM

## 2020-04-14 DIAGNOSIS — Z885 Allergy status to narcotic agent status: Secondary | ICD-10-CM | POA: Insufficient documentation

## 2020-04-14 DIAGNOSIS — Z79899 Other long term (current) drug therapy: Secondary | ICD-10-CM | POA: Diagnosis not present

## 2020-04-14 DIAGNOSIS — G4733 Obstructive sleep apnea (adult) (pediatric): Secondary | ICD-10-CM | POA: Insufficient documentation

## 2020-04-14 DIAGNOSIS — K551 Chronic vascular disorders of intestine: Secondary | ICD-10-CM | POA: Insufficient documentation

## 2020-04-14 DIAGNOSIS — I701 Atherosclerosis of renal artery: Secondary | ICD-10-CM | POA: Insufficient documentation

## 2020-04-14 DIAGNOSIS — Z7984 Long term (current) use of oral hypoglycemic drugs: Secondary | ICD-10-CM | POA: Insufficient documentation

## 2020-04-14 DIAGNOSIS — Z9049 Acquired absence of other specified parts of digestive tract: Secondary | ICD-10-CM | POA: Diagnosis not present

## 2020-04-14 DIAGNOSIS — Z8249 Family history of ischemic heart disease and other diseases of the circulatory system: Secondary | ICD-10-CM | POA: Insufficient documentation

## 2020-04-14 DIAGNOSIS — Z881 Allergy status to other antibiotic agents status: Secondary | ICD-10-CM | POA: Insufficient documentation

## 2020-04-14 DIAGNOSIS — E785 Hyperlipidemia, unspecified: Secondary | ICD-10-CM | POA: Insufficient documentation

## 2020-04-14 HISTORY — PX: RENAL ANGIOGRAPHY: CATH118260

## 2020-04-14 LAB — CREATININE, SERUM
Creatinine, Ser: 0.86 mg/dL (ref 0.44–1.00)
GFR, Estimated: >60 mL/min (ref >60)

## 2020-04-14 LAB — BUN: BUN: 15 mg/dL (ref 8–23)

## 2020-04-14 LAB — GLUCOSE, CAPILLARY: Glucose-Capillary: 140 mg/dL — ABNORMAL HIGH (ref 70–99)

## 2020-04-14 SURGERY — RENAL ANGIOGRAPHY
Anesthesia: Moderate Sedation | Laterality: Right

## 2020-04-14 MED ORDER — CLINDAMYCIN PHOSPHATE 300 MG/50ML IV SOLN
INTRAVENOUS | Status: AC
  Filled 2020-04-14: qty 50

## 2020-04-14 MED ORDER — CLINDAMYCIN PHOSPHATE 300 MG/50ML IV SOLN
INTRAVENOUS | Status: AC
  Administered 2020-04-14: 300 mg via INTRAVENOUS
  Filled 2020-04-14: qty 50

## 2020-04-14 MED ORDER — MIDAZOLAM HCL 2 MG/ML PO SYRP: 8.0000 mg | ORAL_SOLUTION | Freq: Once | ORAL | Status: DC | PRN

## 2020-04-14 MED ORDER — ONDANSETRON HCL 4 MG/2ML IJ SOLN: 4.0000 mg | Freq: Four times a day (QID) | INTRAMUSCULAR | Status: DC | PRN

## 2020-04-14 MED ORDER — DIPHENHYDRAMINE HCL 50 MG/ML IJ SOLN: 50.0000 mg | Freq: Once | INTRAMUSCULAR | Status: DC | PRN

## 2020-04-14 MED ORDER — METHYLPREDNISOLONE SODIUM SUCC 125 MG IJ SOLR: 125.0000 mg | Freq: Once | INTRAMUSCULAR | Status: DC | PRN

## 2020-04-14 MED ORDER — CLINDAMYCIN PHOSPHATE 300 MG/50ML IV SOLN: 300.0000 mg | Freq: Once | INTRAVENOUS | Status: AC

## 2020-04-14 MED ORDER — MIDAZOLAM HCL 5 MG/5ML IJ SOLN
INTRAMUSCULAR | Status: AC
  Filled 2020-04-14: qty 5

## 2020-04-14 MED ORDER — SODIUM CHLORIDE 0.9 % IV SOLN: INTRAVENOUS | Status: DC

## 2020-04-14 MED ORDER — FAMOTIDINE 20 MG PO TABS: 40.0000 mg | ORAL_TABLET | Freq: Once | ORAL | Status: DC | PRN

## 2020-04-14 MED ORDER — HEPARIN SODIUM (PORCINE) 1000 UNIT/ML IJ SOLN
INTRAMUSCULAR | Status: AC
  Filled 2020-04-14: qty 1

## 2020-04-14 MED ORDER — MIDAZOLAM HCL 2 MG/2ML IJ SOLN
INTRAMUSCULAR | Status: DC | PRN
  Administered 2020-04-14: 1 mg via INTRAVENOUS

## 2020-04-14 SURGICAL SUPPLY — 11 items
CATH ANGIO 5F 80CM MHK 2 (CATHETERS) ×4 IMPLANT
CATH ANGIO 5F PIGTAIL 65CM (CATHETERS) ×2 IMPLANT
CATH VS1 5X80 (CATHETERS) ×2 IMPLANT
COVER EZ STRL 42X30 (DRAPES) ×2 IMPLANT
DEVICE STARCLOSE SE CLOSURE (Vascular Products) ×2 IMPLANT
GLIDEWIRE ADV .035X180CM (WIRE) ×2 IMPLANT
PACK ANGIOGRAPHY (CUSTOM PROCEDURE TRAY) ×2 IMPLANT
SHEATH BRITE TIP 5FRX11 (SHEATH) ×2 IMPLANT
SYR MEDRAD MARK 7 150ML (SYRINGE) ×2 IMPLANT
TUBING CONTRAST HIGH PRESS 72 (TUBING) ×2 IMPLANT
WIRE GUIDERIGHT .035X150 (WIRE) ×2 IMPLANT

## 2020-04-14 NOTE — Op Note (Signed)
[facility] Percutaneous Study/Intervention Procedural Note    Surgeon(s): [employer]  Assistants: None  Pre-operative Diagnosis: Severe hypertension, duplex suggesting renal artery stenosis and SMA stenosis  Post-operative diagnosis: Same  Procedure(s) Performed: 1. Ultrasound guidance for vascular access right femoral artery 2. Catheter placement into bilateral renal arteries and the superior mesenteric artery from right femoral approach 3. Aortogram and selective bilateral renal angiogram 4. Selective SMA angiogram 5. StarClose closure device right femoral artery  Contrast: 40 cc  EBL: 5 cc  Fluoro Time: 4.3 minutes  Moderate conscious sedation: Approximately 22 minutes with 1 mg of Versed   Indications: The patient is a 80 year old female with worsening severe hypertension despite multiple medications. The patient has suboptimal blood pressure control despite multiple antihypertensives and a noninvasive study demonstrating hemodynamically significant right renal artery stenosis. Given the clinical scenario and the noninvasive findings, angiogram is indicated for further evaluation of her renal artery and potential treatment. Risks and benefits are discussed and informed consent is obtained.  Her duplex also suggested significant SMA stenosis.  Procedure: The patient was identified and appropriate procedural time out was performed. The patient was then placed supine on the table and prepped and draped in the usual sterile fashion.Moderate conscious sedation was administered with a face to face encounter with the patient throughout the procedure with my supervision of the RN administering medicines and monitoring the patients vital signs and mental status throughout from the start of the procedure until the patient was taken to the recovery room  Ultrasound was used to evaluate  the right common femoral artery. It was patent . A digital ultrasound image was acquired. A Seldinger needle was used to access the right common femoral artery under direct ultrasound guidance and a permanent image was performed. A 0.035 J wire was advanced without resistance and a 5Fr sheath was placed. Pigtail catheter was placed into the aorta at the L1 level and an AP aortogram was performed. This demonstrated relatively normal aorta and iliac arteries.  The origins of the renal arteries were identified and there appeared to be calcific plaque but the disease did not appear severe on aortogram.  I elected to selectively image these in a VS 1 catheter was used.  The left renal artery was cannulated first and imaging demonstrated only about a 10% stenosis with a calcific plaque.  The right renal arteries and cannulated and selective imaging was performed.  There is about a 15 to 20% narrowing with a calcific plaque but nothing close to a significant lesion on selective imaging.  Due to the finding on her duplex suggesting SMA stenosis, we went to a steep LAO projection and cannulated the superior mesenteric artery with a V S1 catheter.  This did have some calcific disease but the stenosis was only in the 30% range and not hemodynamically significant.  I elected to terminate the procedure.  The diagnostic catheter was removed.  Oblique arteriogram was performed of the right femoral artery and StarClose closure device was deployed in the usual fashion with excellent hemostatic result. The patient was taken to the recovery room in stable condition having tolerated the procedure well.  Findings:  Aortogram/Renal Arteries:Aorta and iliac arteries were relatively normal.  Both renal arteries had calcific plaque with minimal stenosis of less than 25%. The SMA had some calcific plaque and stenosis in the 30% range but nothing significant.   Condition:  Stable  Complications: None   Leotis Pain 04/14/2020 11:46 AM  This note  was created with Dragon Medical transcription system. Any errors in dictation are purely unintentional.

## 2020-04-14 NOTE — Interval H&P Note (Signed)
History and Physical Interval Note:  04/14/2020 9:12 AM  Yvonne Davidson  has presented today for surgery, with the diagnosis of RT Renal Artery Stenosis Stent Placement   Renal Artery Stenosis Pt to have Covid test on 3-17.  The various methods of treatment have been discussed with the patient and family. After consideration of risks, benefits and other options for treatment, the patient has consented to  Procedure(s): RENAL ANGIOGRAPHY (Right) as a surgical intervention.  The patient's history has been reviewed, patient examined, no change in status, stable for surgery.  I have reviewed the patient's chart and labs.  Questions were answered to the patient's satisfaction.     Leotis Pain

## 2020-04-18 DIAGNOSIS — G4733 Obstructive sleep apnea (adult) (pediatric): Secondary | ICD-10-CM | POA: Diagnosis not present

## 2020-04-21 DIAGNOSIS — F418 Other specified anxiety disorders: Secondary | ICD-10-CM | POA: Diagnosis not present

## 2020-04-21 DIAGNOSIS — I10 Essential (primary) hypertension: Secondary | ICD-10-CM | POA: Diagnosis not present

## 2020-04-28 ENCOUNTER — Ambulatory Visit: Payer: PPO | Admitting: Cardiovascular Disease

## 2020-05-12 DIAGNOSIS — K219 Gastro-esophageal reflux disease without esophagitis: Secondary | ICD-10-CM | POA: Diagnosis not present

## 2020-05-12 DIAGNOSIS — Z9049 Acquired absence of other specified parts of digestive tract: Secondary | ICD-10-CM | POA: Diagnosis not present

## 2020-05-12 DIAGNOSIS — K591 Functional diarrhea: Secondary | ICD-10-CM | POA: Diagnosis not present

## 2020-05-12 DIAGNOSIS — R072 Precordial pain: Secondary | ICD-10-CM | POA: Diagnosis not present

## 2020-05-13 ENCOUNTER — Other Ambulatory Visit: Payer: Self-pay

## 2020-05-13 ENCOUNTER — Ambulatory Visit (INDEPENDENT_AMBULATORY_CARE_PROVIDER_SITE_OTHER): Payer: PPO | Admitting: Nurse Practitioner

## 2020-05-13 ENCOUNTER — Encounter (INDEPENDENT_AMBULATORY_CARE_PROVIDER_SITE_OTHER): Payer: Self-pay | Admitting: Nurse Practitioner

## 2020-05-13 VITALS — BP 158/70 | HR 51 | Resp 14 | Ht 61.0 in | Wt 154.0 lb

## 2020-05-13 DIAGNOSIS — I10 Essential (primary) hypertension: Secondary | ICD-10-CM

## 2020-05-13 DIAGNOSIS — E119 Type 2 diabetes mellitus without complications: Secondary | ICD-10-CM

## 2020-05-13 DIAGNOSIS — I701 Atherosclerosis of renal artery: Secondary | ICD-10-CM

## 2020-05-13 DIAGNOSIS — K2100 Gastro-esophageal reflux disease with esophagitis, without bleeding: Secondary | ICD-10-CM | POA: Insufficient documentation

## 2020-05-13 DIAGNOSIS — M719 Bursopathy, unspecified: Secondary | ICD-10-CM | POA: Insufficient documentation

## 2020-05-19 ENCOUNTER — Encounter (INDEPENDENT_AMBULATORY_CARE_PROVIDER_SITE_OTHER): Payer: Self-pay | Admitting: Nurse Practitioner

## 2020-05-19 NOTE — Progress Notes (Signed)
Subjective:    Patient ID: Yvonne Davidson, female    DOB: 13-Apr-1940, 81 y.o.   MRN: [id number] Chief Complaint  Patient presents with  . Follow-up    Follow up    Yvonne Davidson is a 80 year old female that returns today for follow-up after renal angiogram with concern for possible renal artery stenosis due to her hypertension.  Based on imaging from the angiogram it is found that the patient has less than 30% stenosis in the bilateral renal arteries.  The patient has had some recent medication changes which is helped to provide some blood pressure control.  She denies any fever or chills.  Overall she is recovered well from her recent intervention.     Review of Systems  Hematological: Does not bruise/bleed easily.  Psychiatric/Behavioral: The patient is nervous/anxious.   All other systems reviewed and are negative.      Objective:   Physical Exam Vitals reviewed.  HENT:     Head: Normocephalic.  Cardiovascular:     Rate and Rhythm: Normal rate.     Pulses: Normal pulses.  Pulmonary:     Effort: Pulmonary effort is normal.  Neurological:     Mental Status: She is alert and oriented to person, place, and time.  Psychiatric:        Mood and Affect: Mood normal.        Behavior: Behavior normal.        Thought Content: Thought content normal.        Judgment: Judgment normal.     BP (!) 158/70 (BP Location: Right Arm)   Pulse (!) 51   Resp 14   Ht $R'5\' 1"'Rg$  (1.549 m)   Wt 154 lb (69.9 kg)   BMI 29.10 kg/m   Past Medical History:  Diagnosis Date  . Allergy   . Anemia   . Anxiety   . Arthritis   . Bilateral cataracts    immature  . Chronic sinusitis    takes Cetirizine daily  . Depression    doesn't take any meds  . Diabetes mellitus without complication (HCC)    borderline  . Diverticulosis   . Dry eyes   . GERD (gastroesophageal reflux disease)    TAKES OMEPRAZOLE DAILY  . Hashimoto's thyroiditis   . History of blood transfusion    in the 60's  no abnormal reaction noted  . History of echocardiogram    a. 05/2011: EF 60%, trace AI, trace MR, mild TR  . Hyperlipidemia    a. self discontinued statin  . Hypertension   . Insomnia    takes Trazodone nightly  . Medication intolerance   . OSA (obstructive sleep apnea)    doesn't use a cpap  . Osteoporosis    takes Vit D as needed  . PONV (postoperative nausea and vomiting)    hard to wake up    Social History   Socioeconomic History  . Marital status: Married    Spouse name: Not on file  . Number of children: 4  . Years of education: Not on file  . Highest education level: Not on file  Occupational History  . 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 Smoking status: Never Smoker  . Smokeless tobacco: Never Used  . Tobacco comment: quit in 1978  Vaping Use  . Vaping Use: Never used  Substance and Sexual Activity  . Alcohol use: No    Alcohol/week: 0.0 standard drinks  . Drug use: No  .  Sexual activity: Not on file  Other Topics Concern  . Not on file  Social History Narrative   Lives with friend. Has 4 children.   Social Determinants of Health   Financial Resource Strain: Not on file  Food Insecurity: Not on file  Transportation Needs: Not on file  Physical Activity: Not on file  Stress: Not on file  Social Connections: Not on file  Intimate Partner Violence: Not on file    Past Surgical History:  Procedure Laterality Date  . CHOLECYSTECTOMY  12/07/2012   Dr Lucia Gaskins  . CHOLECYSTECTOMY N/A 12/07/2012   Procedure: LAPAROSCOPIC CHOLECYSTECTOMY WITH INTRAOPERATIVE CHOLANGIOGRAM;  Surgeon: Shann Medal, MD;  Location: [facility];  Service: General;  Laterality: N/A;  . LIPOSUCTION    . NASAL SINUS SURGERY  2006   x 2  . RENAL ANGIOGRAPHY Right 04/14/2020   Procedure: RENAL ANGIOGRAPHY;  Surgeon: Algernon Huxley, MD;  Location: [facility];  Service: Cardiovascular;  Laterality: Right;  . right arm surgery  1960  . TCS      Family History   Problem Relation Age of Onset  . Arthritis Mother   . Heart disease Mother   . Hyperlipidemia Mother   . Hypertension Mother   . Stroke Mother   . Cancer Mother        Breast & Uterine - 20'-30's  . Aneurysm Mother   . Breast cancer Mother   . Early death Father        Fire  . Alcohol abuse Father     Allergies  Allergen Reactions  . Buprenorphine Hcl Anaphylaxis    "Cant breath when I take it"  . Chlorphen-Diphenhyd-Pe-Apap     Other reaction(s): Other (See Comments) Shortness of breath  . Codeine Shortness Of Breath  . Mold Extract [Trichophyton Mentagrophyte] Shortness Of Breath  . Morphine And Related Anaphylaxis    "Cant breath when I take it"  . Oxycodone Shortness Of Breath  . Oxycodone Hcl Anaphylaxis    ALL NARCOTICS  . Oxycodone Hcl Anaphylaxis    ALL NARCOTICS  . Sumycin [Tetracycline Hcl] Anaphylaxis  . Trichophyton Shortness Of Breath  . Brompheniramine-Pseudoeph Other (See Comments)    Other reaction(s): SHORTNESS OF BREATH  . Cardura [Doxazosin Mesylate]     Palpitations   . Chlorphen-Phenyleph-Asa     Other reaction(s): Other (See Comments) Shortness of breath  . Decongest-Aid [Pseudoephedrine]   . Esomeprazole Magnesium Other (See Comments)    Stomach issues  . Esomeprazole Magnesium     Other reaction(s): Other (See Comments) Stomach issues  . Hydralazine     Palpitations   . Hydrochlorothiazide Other (See Comments)    pancreatitis  . Indomethacin Other (See Comments)    disorientation  . Neomycin-Bacitracin-Polymyxin [Bacitracin-Neomycin-Polymyxin] Other (See Comments)    Other reaction(s): RASH  . Prednisone Hypertension  . Procaine Hcl Other (See Comments)    TACHYCARDIA   . Keflex [Cephalexin] Rash    Unknown, allergic to a lot of antibiotics  . Latex Rash    Breaks out Breaks out  . Neomycin-Bacitracin Zn-Polymyx Rash  . Pseudoephedrine Hcl Er Palpitations  . Sudafed Pe Cold & Cough Child  [Phenylephrine-Dm] Palpitations  .  Telithromycin Rash  . Tetracycline Rash    CBC Latest Ref Rng & Units 04/04/2020 11/24/2019 10/26/2019  WBC 4.0 - 10.5 K/uL 7.7 7.1 10.6(H)  Hemoglobin 12.0 - 15.0 g/dL 11.3(L) 12.7 12.3  Hematocrit 36.0 - 46.0 % 34.6(L) 39.7 37.8  Platelets 150 - 400  K/uL 231 343 342      CMP     Component Value Date/Time   NA 136 04/04/2020 2346   NA 141 02/13/2015 0927   K 3.6 04/04/2020 2346   CL 106 04/04/2020 2346   CO2 22 04/04/2020 2346   GLUCOSE 129 (H) 04/04/2020 2346   BUN 15 04/14/2020 0926   BUN 13 02/13/2015 0927   CREATININE 0.86 04/14/2020 0926   CALCIUM 8.7 (L) 04/04/2020 2346   PROT 7.0 11/24/2019 1210   PROT 6.5 03/03/2015 0839   ALBUMIN 4.0 11/24/2019 1210   ALBUMIN 4.4 03/03/2015 0839   AST 22 11/24/2019 1210   ALT 21 11/24/2019 1210   ALKPHOS 65 11/24/2019 1210   BILITOT 0.5 11/24/2019 1210   BILITOT <0.2 03/03/2015 0839   GFRNONAA >60 04/14/2020 0926   GFRAA >60 10/26/2019 0230     No results found.     Assessment & Plan:   1. Renal artery stenosis (HCC) Based on the patient's recent angiogram she does not have evidence of renal artery stenosis as a causative factor of her blood pressure issues.  The level of stenosis was less than 30% bilaterally.  It is possible that the patient's anxiety plays a role in her elevated blood pressure.  She has been recently started on some medications for this by her primary care physician.  The patient also notes she has a recent upcoming carotid duplex with her cardiologist.  Advised the patient that if necessary we would be happy to provide assistance.  Otherwise we will have the patient follow-up on an as-needed basis.  2. Hypertension goal BP (blood pressure) < 140/90 Continue antihypertensive medications as already ordered, these medications have been reviewed and there are no changes at this time.   3. Controlled type 2 diabetes mellitus without complication, without long-term current use of insulin ([place]) Continue  hypoglycemic medications as already ordered, these medications have been reviewed and there are no changes at this time.  Hgb A1C to be monitored as already arranged by primary service    Current Outpatient Medications on File Prior to Visit  Medication Sig Dispense Refill  . Artificial Tear Ointment (REFRESH LACRI-LUBE) OINT Apply 1 application to eye at bedtime.    Marland Kitchen azelastine (OPTIVAR) 0.05 % ophthalmic solution Place 1 drop into both eyes.    . B-D ULTRA-FINE 33 LANCETS MISC Use as directed. Dx 250.0    . Blood Glucose Monitoring Suppl (ONE TOUCH ULTRA MINI) W/DEVICE KIT USE AS DIRECTED 1 each 0  . BYSTOLIC 10 MG tablet TAKE 1 TABLET BY MOUTH EVERY DAY 90 tablet 3  . carboxymethylcellulose (REFRESH PLUS) 0.5 % SOLN Place 1 drop into both eyes 3 (three) times daily as needed (for dry eyes).     . cloNIDine (CATAPRES) 0.2 MG tablet Take 1 tablet (0.2 mg total) by mouth 3 (three) times daily. 270 tablet 1  . ezetimibe (ZETIA) 10 MG tablet Take 1 tablet by mouth daily.    . metFORMIN (GLUCOPHAGE) 500 MG tablet Take 1 tablet (500 mg total) by mouth 2 (two) times daily with a meal. 60 tablet 3  . olmesartan (BENICAR) 40 MG tablet Take by mouth.    Glory Rosebush DELICA LANCETS 45X MISC Use as directed. Dx 250.0 100 each 3  . pantoprazole (PROTONIX) 20 MG tablet Take 20 mg by mouth daily.    . Olopatadine HCl (PAZEO) 0.7 % SOLN Place 1 drop into both eyes. (Patient not taking: No sig reported)  No current facility-administered medications on file prior to visit.    There are no Patient Instructions on file for this visit. No follow-ups on file.   Kris Hartmann, NP

## 2020-06-09 ENCOUNTER — Ambulatory Visit: Payer: PPO | Admitting: Cardiovascular Disease

## 2020-06-09 ENCOUNTER — Other Ambulatory Visit: Payer: Self-pay

## 2020-06-09 ENCOUNTER — Encounter: Payer: Self-pay | Admitting: Cardiovascular Disease

## 2020-06-09 VITALS — BP 150/70 | HR 56 | Ht 62.0 in | Wt 151.0 lb

## 2020-06-09 DIAGNOSIS — I272 Pulmonary hypertension, unspecified: Secondary | ICD-10-CM | POA: Diagnosis not present

## 2020-06-09 NOTE — Progress Notes (Signed)
Date:  06/09/2020   ID:  Yvonne Davidson, DOB Oct 26, 1940, MRN [id number]  Patient Location:  [address] [place] [address]   Provider location:   [facility], [place] office  PCP:  Yvonne Body, MD  Cardiologist:  Patsy Baltimore   Chief Complaint  Patient presents with  . 2 month follow up    History of Present Illness:    Yvonne Davidson is a 80 y.o. 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 Lucky Cowboy for renal artery stenosis Underwent renal angiogram April 14, 2020  Started on Tums "for blood pressure"  List of current medications as below olmesartan in AM Clonidine 0.1 at noon, 8 pm and 10 pm,  bystolic 10 in the PM  Numbers good before bed, sometimes 017 to 494W systolic  Did not tolerate clonidine 0.2, felt washed out  EKG personally reviewed by myself on todays visit Shows normal sinus rhythm rate 56 bpm no significant ST-T wave changes  Other past medical history reviewed with her today Carotid artery ultrasound from 04/2019 demonstrated 40 to 59% RICA stenosis and 60 to 96% LICA stenosis with slight progression of disease noted along the right side and stable left-sided disease  Has OSA, wears "harness",  chin strap, mouthguard Wears it very tight  Intolerances:  off amlodipine secondary to leg swelling  Unable to tolerate any diuretics per the patient including HCTZ, Lasix, Aldactone She is cautious about taking extra doses of Bystolic or clonidine concerned about bradycardia but has never had symptoms  She stopped  Crestor on her own Previously reported having mold in her attic in the past, felt it was affecting her breathing Had testing done on her house  For obstructive sleep apnea, she wears a mouth piece designed by [facility] . she has chronic aching in her legs. She attributes this to sleep apnea and poor blood supply to her legs  She retired at the age of 87. She was working in [employer]. She was unable to maintain the pace with such poor sleep on a chronic basis   Prior CV studies:   The following studies were reviewed today:    Past Medical History:  Diagnosis Date  . Allergy   . Anemia   . Anxiety   . Arthritis   . Bilateral cataracts    immature  . Chronic sinusitis    takes Cetirizine daily  . Depression    doesn't take any meds  . Diabetes mellitus without complication (HCC)    borderline  . Diverticulosis   . Dry eyes   . GERD (gastroesophageal reflux disease)    TAKES OMEPRAZOLE DAILY  . Hashimoto's thyroiditis   . History of blood transfusion    in the 60's no abnormal reaction noted  . History of echocardiogram    a. 05/2011: EF 60%, trace AI, trace MR, mild TR  . Hyperlipidemia    a. self discontinued statin  . Hypertension   . Insomnia  takes Trazodone nightly  . Medication intolerance   . OSA (obstructive sleep apnea)    doesn't use a cpap  . Osteoporosis    takes Vit D as needed  . PONV (postoperative nausea and vomiting)    hard to wake up   Past Surgical History:  Procedure Laterality Date  . CHOLECYSTECTOMY  12/07/2012   Dr Yvonne Davidson  . CHOLECYSTECTOMY N/A 12/07/2012   Procedure: LAPAROSCOPIC CHOLECYSTECTOMY WITH INTRAOPERATIVE CHOLANGIOGRAM;  Surgeon: Shann Medal, MD;  Location: [facility];  Service: General;  Laterality: N/A;  . LIPOSUCTION    . NASAL SINUS SURGERY  2006   x 2  . RENAL ANGIOGRAPHY Right 04/14/2020   Procedure: RENAL ANGIOGRAPHY;  Surgeon: Yvonne Huxley, MD;  Location: [facility];  Service:  Cardiovascular;  Laterality: Right;  . right arm surgery  1960  . TCS       Current Meds  Medication Sig  . B-D ULTRA-FINE 33 LANCETS MISC Use as directed. Dx 250.0  . Blood Glucose Monitoring Suppl (ONE TOUCH ULTRA MINI) W/DEVICE KIT USE AS DIRECTED  . BYSTOLIC 10 MG tablet TAKE 1 TABLET BY MOUTH EVERY DAY  . cloNIDine (CATAPRES) 0.1 MG tablet Take 0.1 mg by mouth 3 (three) times daily.  Marland Kitchen ezetimibe (ZETIA) 10 MG tablet Take 1 tablet by mouth daily.  . metFORMIN (GLUCOPHAGE) 500 MG tablet Take 250 mg by mouth 2 (two) times daily with a meal.  . olmesartan (BENICAR) 40 MG tablet Take 40 mg by mouth.  Glory Rosebush DELICA LANCETS 40J MISC Use as directed. Dx 250.0  . pantoprazole (PROTONIX) 20 MG tablet Take 20 mg by mouth daily.     Allergies:   Buprenorphine hcl, Chlorphen-diphenhyd-pe-apap, Codeine, Mold extract [trichophyton mentagrophyte], Morphine and related, Oxycodone, Oxycodone hcl, Oxycodone hcl, Sumycin [tetracycline hcl], Trichophyton, Brompheniramine-pseudoeph, Cardura [doxazosin mesylate], Chlorphen-phenyleph-asa, Decongest-aid [pseudoephedrine], Esomeprazole magnesium, Esomeprazole magnesium, Hydralazine, Hydrochlorothiazide, Indomethacin, Neomycin-bacitracin-polymyxin [bacitracin-neomycin-polymyxin], Prednisone, Procaine hcl, Keflex [cephalexin], Latex, Neomycin-bacitracin zn-polymyx, Pseudoephedrine hcl er, Sudafed pe cold & cough child  [phenylephrine-dm], Telithromycin, and Tetracycline   Social History   Tobacco Use  . Smoking status: Never Smoker  . Smokeless tobacco: Never Used  . Tobacco comment: quit in 1978  Vaping Use  . Vaping Use: Never used  Substance Use Topics  . Alcohol use: No    Alcohol/week: 0.0 standard drinks  . Drug use: No     Family Hx: The patient's family history includes Alcohol abuse in her father; Aneurysm in her mother; Arthritis in her mother; Breast cancer in her mother; Cancer in her mother; Early death in her father; Heart disease in  her mother; Hyperlipidemia in her mother; Hypertension in her mother; Stroke in her mother.  ROS:   Please see the history of present illness.    Review of Systems  Constitutional: Negative.   HENT: Negative.   Respiratory: Negative.   Cardiovascular: Negative.   Gastrointestinal: Negative.   Musculoskeletal: Negative.   Neurological: Negative.   Psychiatric/Behavioral: Negative.   All other systems reviewed and are negative.    Labs/Other Tests and Data Reviewed:    Recent Labs: 11/24/2019: ALT 21 04/04/2020: Hemoglobin 11.3; Platelets 231; Potassium 3.6; Sodium 136 04/14/2020: BUN 15; Creatinine, Ser 0.86   Recent Lipid Panel Lab Results  Component Value Date/Time   CHOL 126 03/03/2015 08:39 AM   TRIG 62 03/03/2015 08:39 AM   HDL 47 03/03/2015 08:39 AM   CHOLHDL 2.7 03/03/2015 08:39 AM   CHOLHDL 6 01/15/2014 08:17 AM   LDLCALC  67 03/03/2015 08:39 AM   LDLDIRECT 156.6 05/17/2012 03:44 PM    Wt Readings from Last 3 Encounters:  06/09/20 151 lb (68.5 kg)  05/13/20 154 lb (69.9 kg)  04/14/20 150 lb (68 kg)     Exam:    Vital Signs: Vital signs may also be detailed in the HPI BP (!) 150/70 (BP Location: Left Arm, Patient Position: Sitting, Cuff Size: Normal)   Pulse (!) 56   Ht 5' 2" (1.575 m)   Wt 151 lb (68.5 kg)   SpO2 98%   BMI 27.62 kg/m    Constitutional:  oriented to person, place, and time. No distress.  HENT:  Head: Grossly normal Eyes:  no discharge. No scleral icterus.  Neck: No JVD, no carotid bruits  Cardiovascular: Regular rate and rhythm, no murmurs appreciated Pulmonary/Chest: Clear to auscultation bilaterally, no wheezes or rails Abdominal: Soft.  no distension.  no tenderness.  Musculoskeletal: Normal range of motion Neurological:  normal muscle tone. Coordination normal. No atrophy Skin: Skin warm and dry Psychiatric: normal affect, pleasant   ASSESSMENT & PLAN:    Bilateral carotid artery stenosis -  Does not want a statin,  Right  Carotid: Velocities in the right ICA are consistent with a 40-59%  stenosis.  Left Carotid: Velocities in the left ICA are consistent with a 60-79%  stenosis.  Repeat in 2023 will be ordered  Hypertension goal BP (blood pressure) < 140/90 -  continue losartan, clonidine, Bystolic May need less at night if low with weight  loss  Hyperlipidemia LDL goal <100 - Plan: EKG 12-Lead Previously took herself off Crestor, on red yeast rice  on zetia Weight trending down  OSA on CPAP - Plan: EKG 12-Lead Managed by [facility] Chronic poor sleep hygiene stable  Anxiety Stable Followed by primary care Stressors at home Recommended walking, stress reduction techniques   Total encounter time more than 25 minutes  Greater than 50% was spent in counseling and coordination of care with the patient     Signed, Ida Rogue, MD  06/09/2020 10:28 AM    [place] [facility] [address], [place], [place] [address]

## 2020-06-09 NOTE — Patient Instructions (Addendum)
Medication Instructions:  No changes  For night pressures:  BP <110, no clonidine (before bed)  BP 110 to 120, take 1/2 clonidine (at bedtime)  BP 120 and above, take the full dose clonidine (at bedtime)  If you need a refill on your cardiac medications before your next appointment, please call your pharmacy.    Lab work: No new labs needed  Testing/Procedures: No new testing needed  Follow-Up:  . You will need a follow up appointment in 6 months, APP ok  . Providers on your designated Care Team:   . Murray Hodgkins, NP . Christell Faith, PA-C . Marrianne Mood, PA-C   COVID-19 Vaccine Information can be found at: ShippingScam.co.uk For questions related to vaccine distribution or appointments, please email vaccine@[email or web address] .[email or web address] or call [phone])387-1093.

## 2020-07-03 DIAGNOSIS — E782 Mixed hyperlipidemia: Secondary | ICD-10-CM | POA: Diagnosis not present

## 2020-07-03 DIAGNOSIS — E1159 Type 2 diabetes mellitus with other circulatory complications: Secondary | ICD-10-CM | POA: Diagnosis not present

## 2020-07-03 DIAGNOSIS — E785 Hyperlipidemia, unspecified: Secondary | ICD-10-CM | POA: Diagnosis not present

## 2020-07-03 DIAGNOSIS — E1169 Type 2 diabetes mellitus with other specified complication: Secondary | ICD-10-CM | POA: Diagnosis not present

## 2020-07-10 DIAGNOSIS — E1169 Type 2 diabetes mellitus with other specified complication: Secondary | ICD-10-CM | POA: Diagnosis not present

## 2020-07-10 DIAGNOSIS — E785 Hyperlipidemia, unspecified: Secondary | ICD-10-CM | POA: Diagnosis not present

## 2020-07-10 DIAGNOSIS — E782 Mixed hyperlipidemia: Secondary | ICD-10-CM | POA: Diagnosis not present

## 2020-07-10 DIAGNOSIS — Z780 Asymptomatic menopausal state: Secondary | ICD-10-CM | POA: Diagnosis not present

## 2020-07-10 DIAGNOSIS — I10 Essential (primary) hypertension: Secondary | ICD-10-CM | POA: Diagnosis not present

## 2020-07-10 DIAGNOSIS — M8589 Other specified disorders of bone density and structure, multiple sites: Secondary | ICD-10-CM | POA: Diagnosis not present

## 2020-07-26 ENCOUNTER — Emergency Department: Payer: PPO

## 2020-07-26 ENCOUNTER — Emergency Department
Admission: EM | Admit: 2020-07-26 | Discharge: 2020-07-27 | Disposition: A | Discharge status: Home / Self Care | Payer: PPO | Attending: Emergency Medicine | Admitting: Emergency Medicine

## 2020-07-26 ENCOUNTER — Other Ambulatory Visit: Payer: Self-pay

## 2020-07-26 DIAGNOSIS — I700 Atherosclerosis of aorta: Secondary | ICD-10-CM | POA: Diagnosis not present

## 2020-07-26 DIAGNOSIS — S42201A Unspecified fracture of upper end of right humerus, initial encounter for closed fracture: Secondary | ICD-10-CM | POA: Insufficient documentation

## 2020-07-26 DIAGNOSIS — E119 Type 2 diabetes mellitus without complications: Secondary | ICD-10-CM | POA: Diagnosis not present

## 2020-07-26 DIAGNOSIS — Z7984 Long term (current) use of oral hypoglycemic drugs: Secondary | ICD-10-CM | POA: Diagnosis not present

## 2020-07-26 DIAGNOSIS — M7989 Other specified soft tissue disorders: Secondary | ICD-10-CM | POA: Diagnosis not present

## 2020-07-26 DIAGNOSIS — Y9301 Activity, walking, marching and hiking: Secondary | ICD-10-CM | POA: Diagnosis not present

## 2020-07-26 DIAGNOSIS — E039 Hypothyroidism, unspecified: Secondary | ICD-10-CM | POA: Diagnosis not present

## 2020-07-26 DIAGNOSIS — S42351A Displaced comminuted fracture of shaft of humerus, right arm, initial encounter for closed fracture: Secondary | ICD-10-CM | POA: Diagnosis not present

## 2020-07-26 DIAGNOSIS — S4991XA Unspecified injury of right shoulder and upper arm, initial encounter: Secondary | ICD-10-CM | POA: Diagnosis present

## 2020-07-26 DIAGNOSIS — I10 Essential (primary) hypertension: Secondary | ICD-10-CM | POA: Insufficient documentation

## 2020-07-26 DIAGNOSIS — Z79899 Other long term (current) drug therapy: Secondary | ICD-10-CM | POA: Insufficient documentation

## 2020-07-26 DIAGNOSIS — J9811 Atelectasis: Secondary | ICD-10-CM | POA: Diagnosis not present

## 2020-07-26 DIAGNOSIS — Z91040 Latex allergy status: Secondary | ICD-10-CM | POA: Diagnosis not present

## 2020-07-26 DIAGNOSIS — S42251A Displaced fracture of greater tuberosity of right humerus, initial encounter for closed fracture: Secondary | ICD-10-CM | POA: Diagnosis not present

## 2020-07-26 DIAGNOSIS — W010XXA Fall on same level from slipping, tripping and stumbling without subsequent striking against object, initial encounter: Secondary | ICD-10-CM | POA: Diagnosis not present

## 2020-07-26 MED ORDER — SODIUM CHLORIDE 0.9 % IV BOLUS
1000.0000 mL | Freq: Once | INTRAVENOUS | Status: AC
  Administered 2020-07-26: 1000 mL via INTRAVENOUS

## 2020-07-26 MED ORDER — ACETAMINOPHEN 500 MG PO TABS
1000.0000 mg | ORAL_TABLET | Freq: Once | ORAL | Status: AC
  Administered 2020-07-26: 1000 mg via ORAL
  Filled 2020-07-26: qty 2

## 2020-07-26 MED ORDER — CLONIDINE HCL 0.1 MG PO TABS
0.1000 mg | ORAL_TABLET | Freq: Once | ORAL | Status: AC
  Administered 2020-07-26: 0.1 mg via ORAL
  Filled 2020-07-26: qty 1

## 2020-07-26 MED ORDER — FENTANYL CITRATE (PF) 100 MCG/2ML IJ SOLN
25.0000 ug | Freq: Once | INTRAMUSCULAR | Status: AC
  Administered 2020-07-26: 25 ug via INTRAVENOUS
  Filled 2020-07-26: qty 2

## 2020-07-26 NOTE — ED Triage Notes (Signed)
Pt presents via POV c/o right shoulder pain s/p mechanical fall. Pt reports fell over a table while walking in the dark.

## 2020-07-26 NOTE — Discharge Instructions (Addendum)
Please call the number provided for orthopedics to arrange a follow-up appointment next week for recheck/reevaluation.  Please use Tylenol 1000 mg 3 times a day as needed for pain/discomfort.  You may try 25mg  of tramadol (1/2 a tramadol tablet) as needed for breakthrough pain. Please wear your shoulder immobilizer.  You may take this off to shower if needed.

## 2020-07-26 NOTE — ED Notes (Signed)
ED Provider at bedside. 
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
## 2020-07-26 NOTE — ED Notes (Signed)
Patient transported to CT 

## 2020-07-27 DIAGNOSIS — S42201A Unspecified fracture of upper end of right humerus, initial encounter for closed fracture: Secondary | ICD-10-CM | POA: Diagnosis not present

## 2020-07-27 MED ORDER — TRAMADOL HCL 50 MG PO TABS: 50.0000 mg | ORAL_TABLET | Freq: Four times a day (QID) | ORAL | 0 refills | Status: DC | PRN

## 2020-07-31 DIAGNOSIS — G4733 Obstructive sleep apnea (adult) (pediatric): Secondary | ICD-10-CM | POA: Diagnosis not present

## 2020-08-01 DIAGNOSIS — M25511 Pain in right shoulder: Secondary | ICD-10-CM | POA: Diagnosis not present

## 2020-08-01 DIAGNOSIS — S42201A Unspecified fracture of upper end of right humerus, initial encounter for closed fracture: Secondary | ICD-10-CM | POA: Diagnosis not present

## 2020-08-11 DIAGNOSIS — L2389 Allergic contact dermatitis due to other agents: Secondary | ICD-10-CM | POA: Diagnosis not present

## 2020-08-11 DIAGNOSIS — S42201A Unspecified fracture of upper end of right humerus, initial encounter for closed fracture: Secondary | ICD-10-CM | POA: Diagnosis not present

## 2020-08-13 DIAGNOSIS — R519 Headache, unspecified: Secondary | ICD-10-CM | POA: Diagnosis not present

## 2020-08-13 DIAGNOSIS — Z20822 Contact with and (suspected) exposure to covid-19: Secondary | ICD-10-CM | POA: Diagnosis not present

## 2020-09-01 DIAGNOSIS — S42201A Unspecified fracture of upper end of right humerus, initial encounter for closed fracture: Secondary | ICD-10-CM | POA: Diagnosis not present

## 2020-09-02 DIAGNOSIS — Z9989 Dependence on other enabling machines and devices: Secondary | ICD-10-CM | POA: Diagnosis not present

## 2020-09-02 DIAGNOSIS — H1131 Conjunctival hemorrhage, right eye: Secondary | ICD-10-CM | POA: Diagnosis not present

## 2020-09-02 DIAGNOSIS — E119 Type 2 diabetes mellitus without complications: Secondary | ICD-10-CM | POA: Diagnosis not present

## 2020-09-02 DIAGNOSIS — G4733 Obstructive sleep apnea (adult) (pediatric): Secondary | ICD-10-CM | POA: Diagnosis not present

## 2020-09-02 DIAGNOSIS — Z0100 Encounter for examination of eyes and vision without abnormal findings: Secondary | ICD-10-CM | POA: Diagnosis not present

## 2020-09-02 DIAGNOSIS — I10 Essential (primary) hypertension: Secondary | ICD-10-CM | POA: Diagnosis not present

## 2020-09-12 DIAGNOSIS — M25511 Pain in right shoulder: Secondary | ICD-10-CM | POA: Diagnosis not present

## 2020-09-15 DIAGNOSIS — M25511 Pain in right shoulder: Secondary | ICD-10-CM | POA: Diagnosis not present

## 2020-09-17 DIAGNOSIS — M25511 Pain in right shoulder: Secondary | ICD-10-CM | POA: Diagnosis not present

## 2020-09-22 DIAGNOSIS — M25511 Pain in right shoulder: Secondary | ICD-10-CM | POA: Diagnosis not present

## 2020-09-24 DIAGNOSIS — M25511 Pain in right shoulder: Secondary | ICD-10-CM | POA: Diagnosis not present

## 2020-10-01 DIAGNOSIS — M25511 Pain in right shoulder: Secondary | ICD-10-CM | POA: Diagnosis not present

## 2020-10-03 DIAGNOSIS — M25511 Pain in right shoulder: Secondary | ICD-10-CM | POA: Diagnosis not present

## 2020-10-07 DIAGNOSIS — M25511 Pain in right shoulder: Secondary | ICD-10-CM | POA: Diagnosis not present

## 2020-10-10 DIAGNOSIS — M25511 Pain in right shoulder: Secondary | ICD-10-CM | POA: Diagnosis not present

## 2020-10-17 DIAGNOSIS — S42201D Unspecified fracture of upper end of right humerus, subsequent encounter for fracture with routine healing: Secondary | ICD-10-CM | POA: Diagnosis not present

## 2020-10-21 DIAGNOSIS — M25511 Pain in right shoulder: Secondary | ICD-10-CM | POA: Diagnosis not present

## 2020-10-21 DIAGNOSIS — G8929 Other chronic pain: Secondary | ICD-10-CM | POA: Diagnosis not present

## 2020-10-24 DIAGNOSIS — M25511 Pain in right shoulder: Secondary | ICD-10-CM | POA: Diagnosis not present

## 2020-10-24 DIAGNOSIS — G8929 Other chronic pain: Secondary | ICD-10-CM | POA: Diagnosis not present

## 2020-10-29 DIAGNOSIS — M25511 Pain in right shoulder: Secondary | ICD-10-CM | POA: Diagnosis not present

## 2020-10-29 DIAGNOSIS — G8929 Other chronic pain: Secondary | ICD-10-CM | POA: Diagnosis not present

## 2020-10-31 DIAGNOSIS — M25511 Pain in right shoulder: Secondary | ICD-10-CM | POA: Diagnosis not present

## 2020-10-31 DIAGNOSIS — G8929 Other chronic pain: Secondary | ICD-10-CM | POA: Diagnosis not present

## 2020-11-04 DIAGNOSIS — G8929 Other chronic pain: Secondary | ICD-10-CM | POA: Diagnosis not present

## 2020-11-04 DIAGNOSIS — M25511 Pain in right shoulder: Secondary | ICD-10-CM | POA: Diagnosis not present

## 2020-11-06 DIAGNOSIS — G8929 Other chronic pain: Secondary | ICD-10-CM | POA: Diagnosis not present

## 2020-11-06 DIAGNOSIS — M25511 Pain in right shoulder: Secondary | ICD-10-CM | POA: Diagnosis not present

## 2020-11-11 DIAGNOSIS — M25511 Pain in right shoulder: Secondary | ICD-10-CM | POA: Diagnosis not present

## 2020-11-11 DIAGNOSIS — G8929 Other chronic pain: Secondary | ICD-10-CM | POA: Diagnosis not present

## 2020-11-13 DIAGNOSIS — M25511 Pain in right shoulder: Secondary | ICD-10-CM | POA: Diagnosis not present

## 2020-11-13 DIAGNOSIS — G8929 Other chronic pain: Secondary | ICD-10-CM | POA: Diagnosis not present

## 2020-11-17 ENCOUNTER — Other Ambulatory Visit: Payer: Self-pay | Admitting: Cardiovascular Disease

## 2020-11-18 DIAGNOSIS — M25511 Pain in right shoulder: Secondary | ICD-10-CM | POA: Diagnosis not present

## 2020-11-18 DIAGNOSIS — G8929 Other chronic pain: Secondary | ICD-10-CM | POA: Diagnosis not present

## 2020-11-20 DIAGNOSIS — M25511 Pain in right shoulder: Secondary | ICD-10-CM | POA: Diagnosis not present

## 2020-11-20 DIAGNOSIS — G8929 Other chronic pain: Secondary | ICD-10-CM | POA: Diagnosis not present

## 2020-11-24 DIAGNOSIS — Z9989 Dependence on other enabling machines and devices: Secondary | ICD-10-CM | POA: Diagnosis not present

## 2020-11-24 DIAGNOSIS — G4733 Obstructive sleep apnea (adult) (pediatric): Secondary | ICD-10-CM | POA: Diagnosis not present

## 2020-11-25 DIAGNOSIS — G4733 Obstructive sleep apnea (adult) (pediatric): Secondary | ICD-10-CM | POA: Diagnosis not present

## 2020-11-25 DIAGNOSIS — G8929 Other chronic pain: Secondary | ICD-10-CM | POA: Diagnosis not present

## 2020-11-25 DIAGNOSIS — M25511 Pain in right shoulder: Secondary | ICD-10-CM | POA: Diagnosis not present

## 2020-11-28 DIAGNOSIS — G8929 Other chronic pain: Secondary | ICD-10-CM | POA: Diagnosis not present

## 2020-11-28 DIAGNOSIS — M25511 Pain in right shoulder: Secondary | ICD-10-CM | POA: Diagnosis not present

## 2020-11-28 DIAGNOSIS — S42201D Unspecified fracture of upper end of right humerus, subsequent encounter for fracture with routine healing: Secondary | ICD-10-CM | POA: Diagnosis not present

## 2020-12-02 DIAGNOSIS — M25511 Pain in right shoulder: Secondary | ICD-10-CM | POA: Diagnosis not present

## 2020-12-02 DIAGNOSIS — G8929 Other chronic pain: Secondary | ICD-10-CM | POA: Diagnosis not present

## 2020-12-04 DIAGNOSIS — M25511 Pain in right shoulder: Secondary | ICD-10-CM | POA: Diagnosis not present

## 2020-12-04 DIAGNOSIS — G8929 Other chronic pain: Secondary | ICD-10-CM | POA: Diagnosis not present

## 2020-12-09 DIAGNOSIS — M25511 Pain in right shoulder: Secondary | ICD-10-CM | POA: Diagnosis not present

## 2020-12-09 DIAGNOSIS — G8929 Other chronic pain: Secondary | ICD-10-CM | POA: Diagnosis not present
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
## 2020-12-11 DIAGNOSIS — M25511 Pain in right shoulder: Secondary | ICD-10-CM | POA: Diagnosis not present

## 2020-12-11 DIAGNOSIS — G8929 Other chronic pain: Secondary | ICD-10-CM | POA: Diagnosis not present

## 2020-12-12 ENCOUNTER — Encounter: Payer: Self-pay | Admitting: Cardiovascular Disease

## 2020-12-12 ENCOUNTER — Ambulatory Visit: Payer: HMO | Admitting: Cardiovascular Disease

## 2020-12-12 ENCOUNTER — Other Ambulatory Visit: Payer: Self-pay

## 2020-12-12 VITALS — BP 130/60 | HR 55 | Ht 62.0 in | Wt 151.4 lb

## 2020-12-12 DIAGNOSIS — R0602 Shortness of breath: Secondary | ICD-10-CM

## 2020-12-12 DIAGNOSIS — I272 Pulmonary hypertension, unspecified: Secondary | ICD-10-CM | POA: Diagnosis not present

## 2020-12-12 DIAGNOSIS — I739 Peripheral vascular disease, unspecified: Secondary | ICD-10-CM | POA: Diagnosis not present

## 2020-12-12 DIAGNOSIS — E1169 Type 2 diabetes mellitus with other specified complication: Secondary | ICD-10-CM

## 2020-12-12 DIAGNOSIS — I771 Stricture of artery: Secondary | ICD-10-CM

## 2020-12-12 DIAGNOSIS — K551 Chronic vascular disorders of intestine: Secondary | ICD-10-CM

## 2020-12-12 DIAGNOSIS — I6523 Occlusion and stenosis of bilateral carotid arteries: Secondary | ICD-10-CM | POA: Diagnosis not present

## 2020-12-12 DIAGNOSIS — E785 Hyperlipidemia, unspecified: Secondary | ICD-10-CM

## 2020-12-12 DIAGNOSIS — I701 Atherosclerosis of renal artery: Secondary | ICD-10-CM

## 2020-12-12 MED ORDER — CLONIDINE HCL 0.1 MG PO TABS: 0.1000 mg | ORAL_TABLET | Freq: Two times a day (BID) | ORAL | 3 refills | Status: DC

## 2020-12-12 MED ORDER — OLMESARTAN MEDOXOMIL 40 MG PO TABS: 40.0000 mg | ORAL_TABLET | Freq: Every day | ORAL | 3 refills | Status: DC

## 2020-12-12 MED ORDER — EZETIMIBE 10 MG PO TABS: 10.0000 mg | ORAL_TABLET | Freq: Every day | ORAL | 3 refills | Status: DC

## 2020-12-12 MED ORDER — NEBIVOLOL HCL 10 MG PO TABS: 10.0000 mg | ORAL_TABLET | Freq: Every day | ORAL | 3 refills | Status: DC

## 2020-12-12 NOTE — Patient Instructions (Addendum)
Medication Instructions:  No changes  If you need a refill on your cardiac medications before your next appointment, please call your pharmacy.   Lab work: No new labs needed  Testing/Procedures: No new testing needed  Follow-Up: At [facility], you and your health needs are our priority.  As part of our continuing mission to provide you with exceptional heart care, we have created designated Provider Care Teams.  These Care Teams include your primary Cardiologist (physician) and Advanced Practice Providers (APPs -  Physician Assistants and Nurse Practitioners) who all work together to provide you with the care you need, when you need it.  You will need a follow up appointment in 12 months  Providers on your designated Care Team:   Celesta Aver, PA-C Cadence Kathlen Mody, [place]  COVID-19 Vaccine Information can be found at: [email or web address] For questions related to vaccine distribution or appointments, please email [email or web address] or call [phone].

## 2020-12-24 DIAGNOSIS — G8929 Other chronic pain: Secondary | ICD-10-CM | POA: Diagnosis not present

## 2020-12-24 DIAGNOSIS — M25511 Pain in right shoulder: Secondary | ICD-10-CM | POA: Diagnosis not present

## 2020-12-26 DIAGNOSIS — G8929 Other chronic pain: Secondary | ICD-10-CM | POA: Diagnosis not present

## 2020-12-26 DIAGNOSIS — M25511 Pain in right shoulder: Secondary | ICD-10-CM | POA: Diagnosis not present

## 2021-01-01 DIAGNOSIS — M25511 Pain in right shoulder: Secondary | ICD-10-CM | POA: Diagnosis not present

## 2021-01-01 DIAGNOSIS — G8929 Other chronic pain: Secondary | ICD-10-CM | POA: Diagnosis not present

## 2021-01-02 DIAGNOSIS — E1169 Type 2 diabetes mellitus with other specified complication: Secondary | ICD-10-CM | POA: Diagnosis not present

## 2021-01-02 DIAGNOSIS — I10 Essential (primary) hypertension: Secondary | ICD-10-CM | POA: Diagnosis not present

## 2021-01-02 DIAGNOSIS — E785 Hyperlipidemia, unspecified: Secondary | ICD-10-CM | POA: Diagnosis not present

## 2021-01-02 DIAGNOSIS — E782 Mixed hyperlipidemia: Secondary | ICD-10-CM | POA: Diagnosis not present

## 2021-01-09 DIAGNOSIS — I10 Essential (primary) hypertension: Secondary | ICD-10-CM | POA: Diagnosis not present

## 2021-01-09 DIAGNOSIS — Z9989 Dependence on other enabling machines and devices: Secondary | ICD-10-CM | POA: Diagnosis not present

## 2021-01-09 DIAGNOSIS — E038 Other specified hypothyroidism: Secondary | ICD-10-CM | POA: Diagnosis not present

## 2021-01-09 DIAGNOSIS — E785 Hyperlipidemia, unspecified: Secondary | ICD-10-CM | POA: Diagnosis not present

## 2021-01-09 DIAGNOSIS — G4733 Obstructive sleep apnea (adult) (pediatric): Secondary | ICD-10-CM | POA: Diagnosis not present

## 2021-01-09 DIAGNOSIS — E1169 Type 2 diabetes mellitus with other specified complication: Secondary | ICD-10-CM | POA: Diagnosis not present

## 2021-01-09 DIAGNOSIS — E782 Mixed hyperlipidemia: Secondary | ICD-10-CM | POA: Diagnosis not present

## 2021-02-01 IMAGING — CT CT HEAD W/O CM
3 series · 16 of 47 positions shown, 19 images · non-contrast
Comparison: October 30, 2019

CLINICAL DATA: Neurologic deficit.

EXAM:
CT HEAD WITHOUT CONTRAST
TECHNIQUE: Contiguous axial images were obtained from the base of the skull
through the vertex without intravenous contrast.

[Series 3: head 5.0 h30s · axial · 0.42mm/px · z∈[-142,-17]mm · 10 of 30 slices shown, 13 images]
[im 3/30  brain]
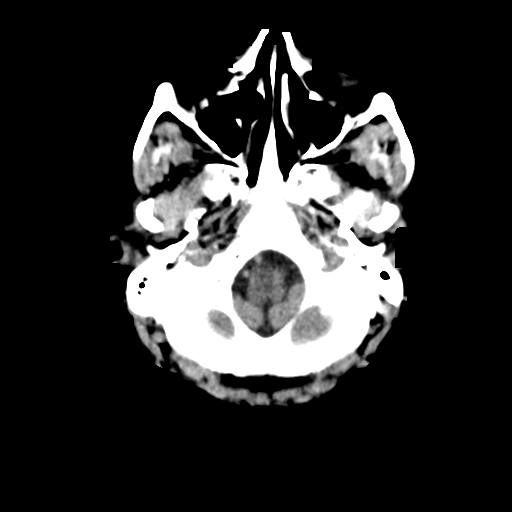
[im 3/30  bone]
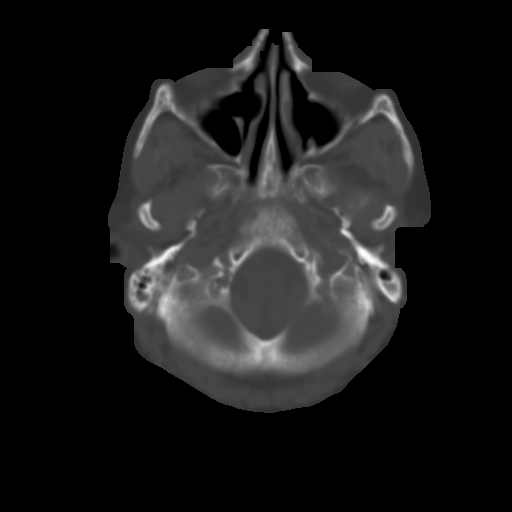
[im 6/30  brain]
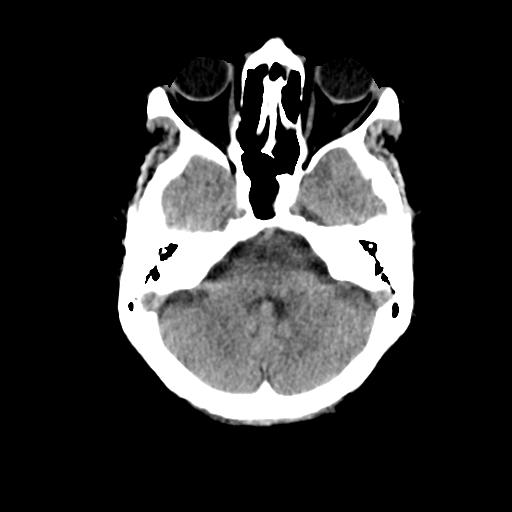
[im 9/30  brain]
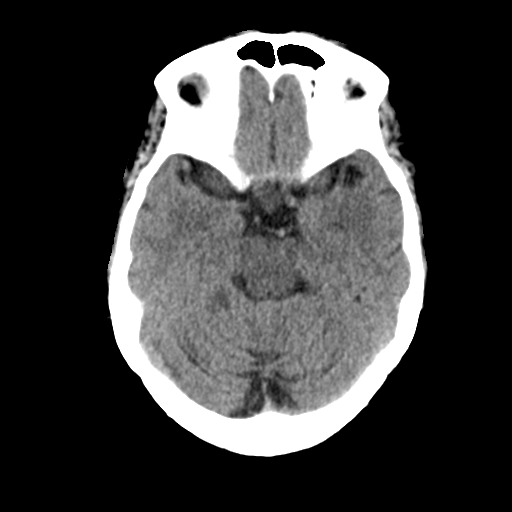
[im 11/30  brain]
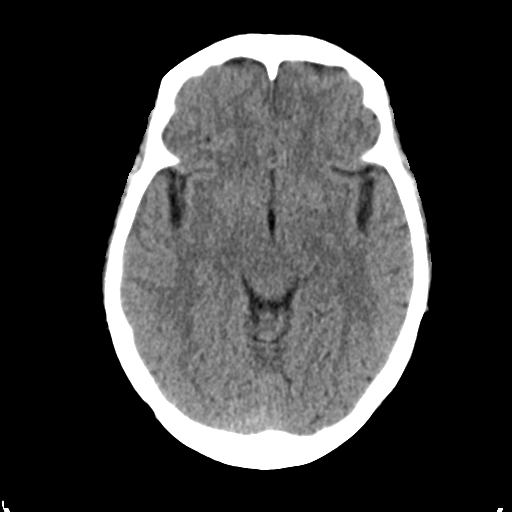
[im 14/30  brain]
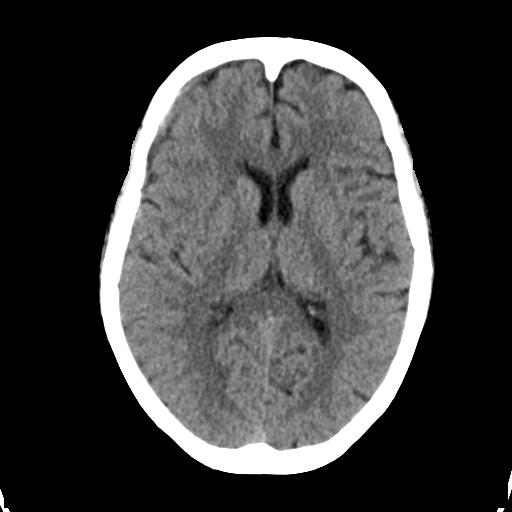
[im 14/30  bone]
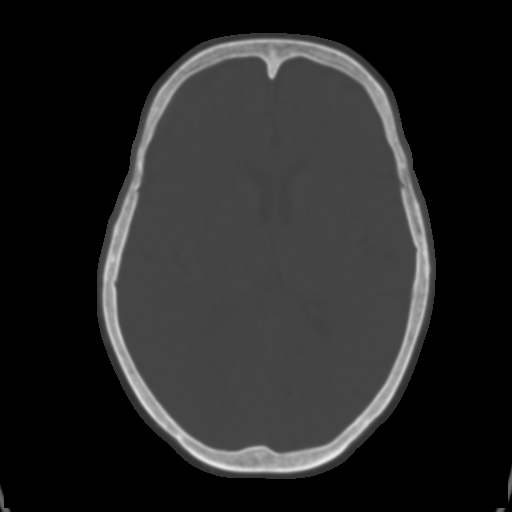
[im 17/30  brain]
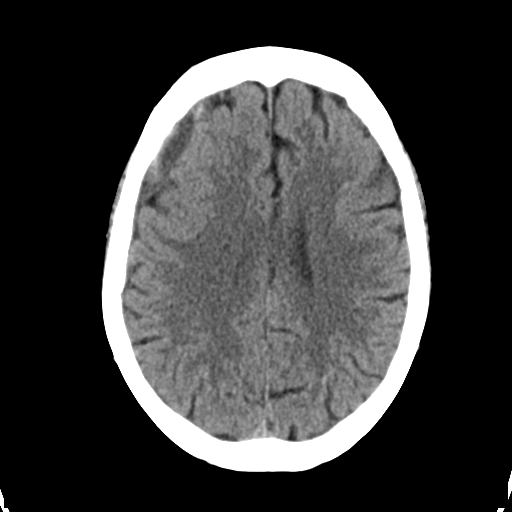
[im 20/30  brain]
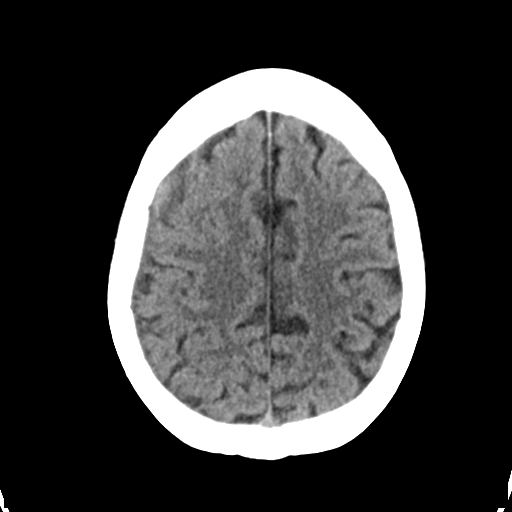
[im 23/30  brain]
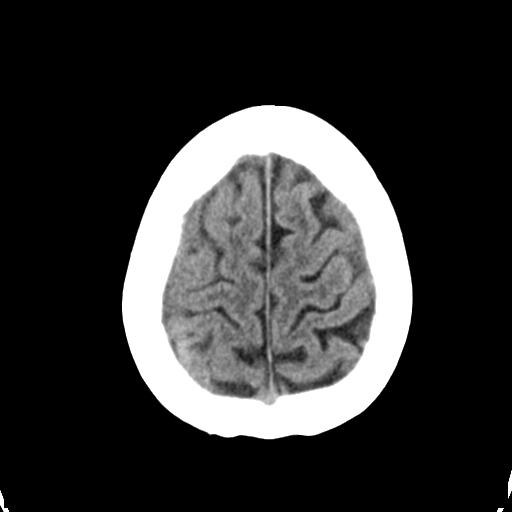
[im 25/30  brain]
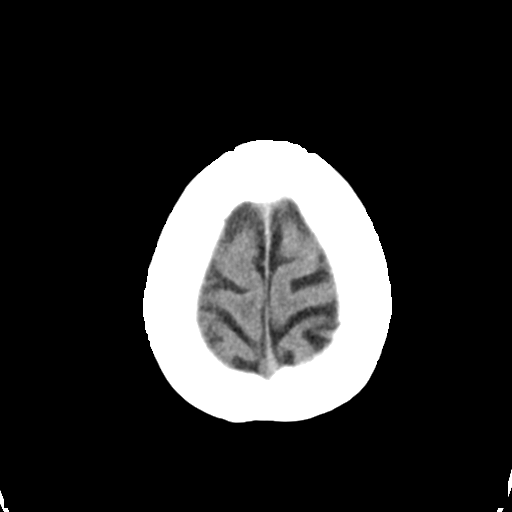
[im 25/30  bone]
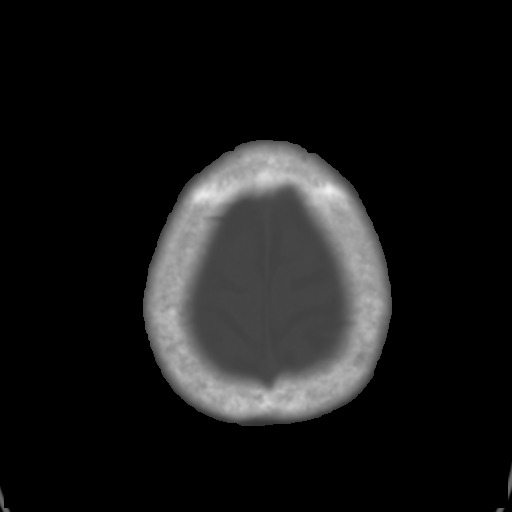
[im 28/30  brain]
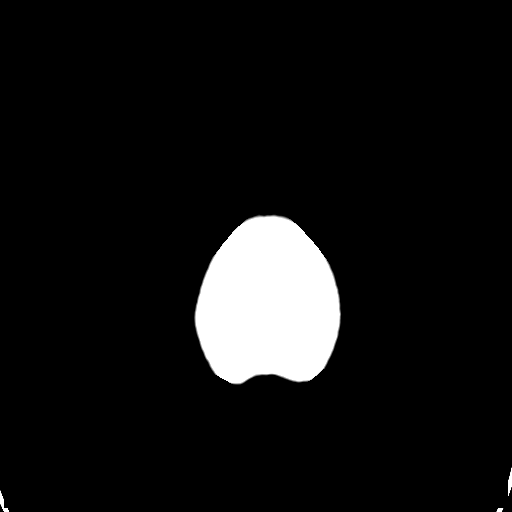

[Series 5: head 3.0 mpr cor · coronal · 0.29mm/px · 3 of 67 slices shown]
[im 23/67  brain]
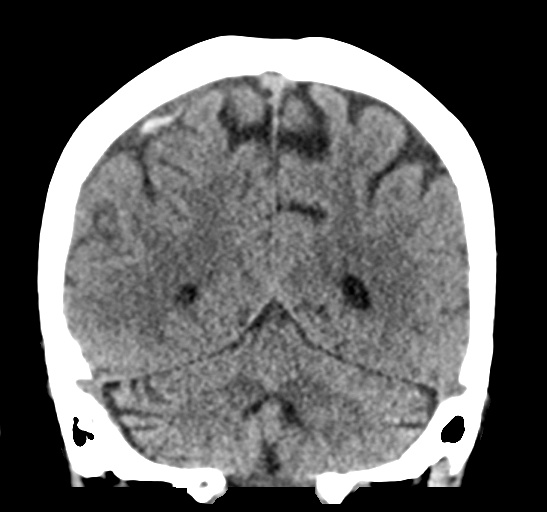
[im 30/67  brain]
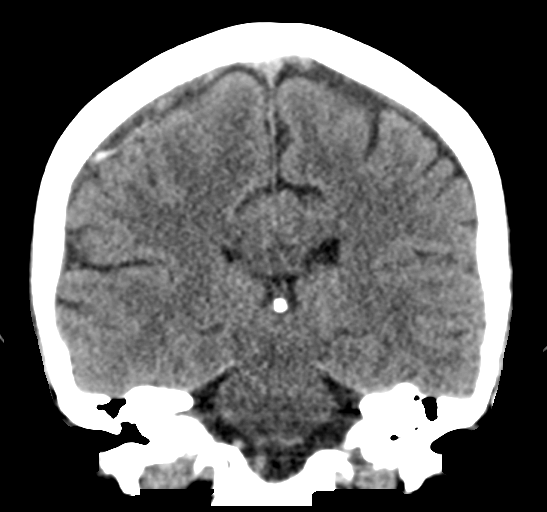
[im 37/67  brain]
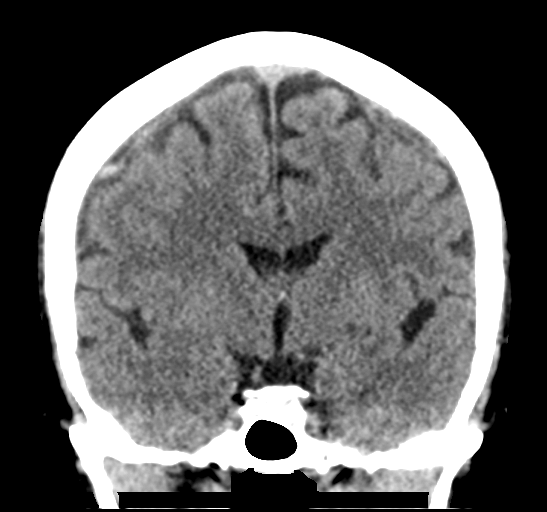

[Series 6: head 3.0 mpr sag · sagittal · 0.30mm/px · 3 of 67 slices shown]
[im 23/67  brain]
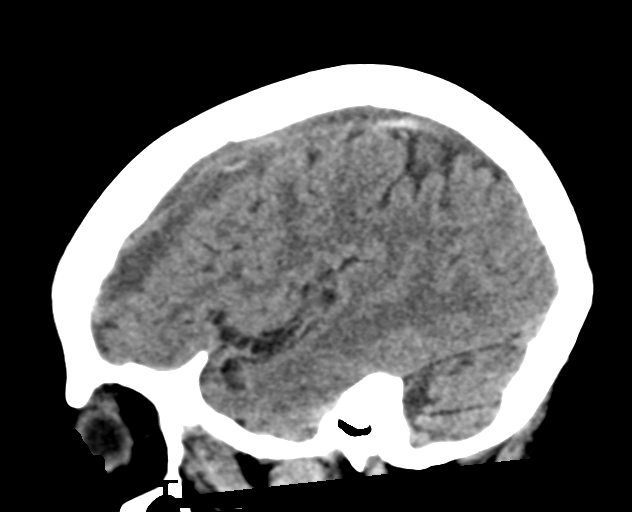
[im 34/67  brain]
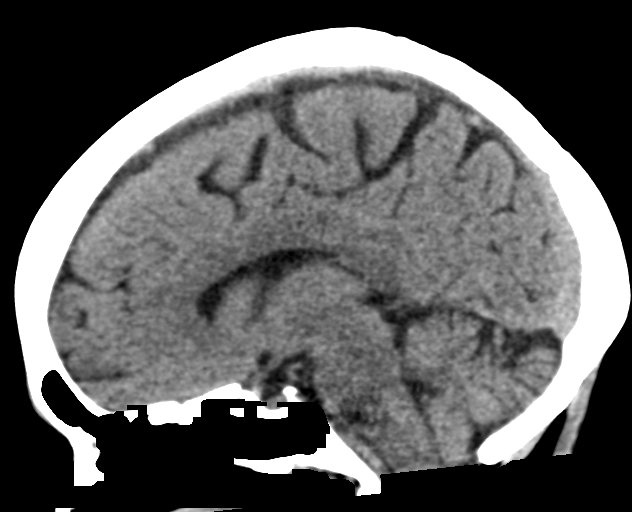
[im 45/67  brain]
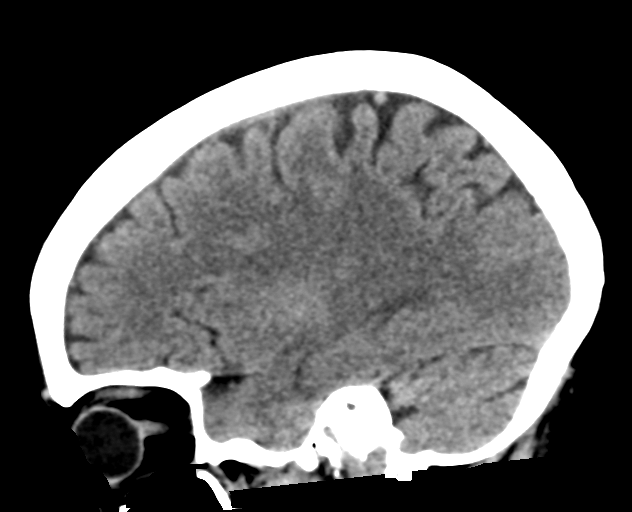

[16 of 47 positions shown; findings below may reference images not displayed]

FINDINGS: Brain: Persistent stable in size mixed density extra-axial
hemorrhages over the convexities, right greater than left. No
measurable midline shift. Minimal persistent mass effect to the
right lateral ventricle. No evidence of acute infarction,
hydrocephalus, or mass lesion.

Vascular: Intra cavernous internal carotid arteries atherosclerotic
disease.

Skull: Normal. Negative for fracture or focal lesion.

Sinuses/Orbits: No acute finding.

Other: None.
IMPRESSION: 1. Stable in size mixed density extra-axial hemorrhages over the
convexities, right greater than left. No measurable midline shift.
Minimal persistent mass effect to the right lateral ventricle.

2.  No acute infarct identified.

## 2021-02-12 DIAGNOSIS — G4733 Obstructive sleep apnea (adult) (pediatric): Secondary | ICD-10-CM | POA: Diagnosis not present

## 2021-02-16 DIAGNOSIS — E119 Type 2 diabetes mellitus without complications: Secondary | ICD-10-CM | POA: Diagnosis not present

## 2021-03-16 ENCOUNTER — Other Ambulatory Visit: Payer: Self-pay | Admitting: Family Medicine

## 2021-03-16 DIAGNOSIS — G4733 Obstructive sleep apnea (adult) (pediatric): Secondary | ICD-10-CM | POA: Diagnosis not present

## 2021-03-16 DIAGNOSIS — Z1231 Encounter for screening mammogram for malignant neoplasm of breast: Secondary | ICD-10-CM

## 2021-04-02 DIAGNOSIS — I10 Essential (primary) hypertension: Secondary | ICD-10-CM | POA: Diagnosis not present

## 2021-04-02 DIAGNOSIS — M8589 Other specified disorders of bone density and structure, multiple sites: Secondary | ICD-10-CM | POA: Diagnosis not present

## 2021-04-02 DIAGNOSIS — Z79899 Other long term (current) drug therapy: Secondary | ICD-10-CM | POA: Diagnosis not present

## 2021-04-07 ENCOUNTER — Telehealth: Payer: Self-pay

## 2021-04-07 NOTE — Telephone Encounter (Signed)
Patient left a voicemail to schedule an appointment with Dr.Saadat Humphrey Rolls. I returned her phone call and left a voicemail asking her to contact our office to schedule an appointment. ?

## 2021-04-13 DIAGNOSIS — M810 Age-related osteoporosis without current pathological fracture: Secondary | ICD-10-CM | POA: Diagnosis not present

## 2021-04-15 DIAGNOSIS — G4733 Obstructive sleep apnea (adult) (pediatric): Secondary | ICD-10-CM | POA: Diagnosis not present

## 2021-04-21 ENCOUNTER — Telehealth: Payer: Self-pay

## 2021-04-21 ENCOUNTER — Other Ambulatory Visit: Payer: Self-pay

## 2021-04-21 ENCOUNTER — Ambulatory Visit
Admission: RE | Admit: 2021-04-21 | Discharge: 2021-04-21 | Disposition: A | Discharge status: Home / Self Care | Payer: HMO | Source: Ambulatory Visit | Attending: Family Medicine | Admitting: Family Medicine

## 2021-04-21 DIAGNOSIS — Z1231 Encounter for screening mammogram for malignant neoplasm of breast: Secondary | ICD-10-CM | POA: Insufficient documentation

## 2021-04-21 NOTE — Telephone Encounter (Signed)
Left vm to confirm 04/28/21 appointment-Toni ?

## 2021-04-28 ENCOUNTER — Encounter: Payer: Self-pay | Admitting: Internal Medicine

## 2021-04-28 ENCOUNTER — Ambulatory Visit: Payer: PPO | Admitting: Internal Medicine

## 2021-04-28 ENCOUNTER — Telehealth: Payer: Self-pay

## 2021-04-28 VITALS — BP 146/64 | Resp 16 | Ht 62.0 in | Wt 154.6 lb

## 2021-04-28 DIAGNOSIS — E039 Hypothyroidism, unspecified: Secondary | ICD-10-CM

## 2021-04-28 DIAGNOSIS — G4733 Obstructive sleep apnea (adult) (pediatric): Secondary | ICD-10-CM | POA: Diagnosis not present

## 2021-04-28 DIAGNOSIS — R0602 Shortness of breath: Secondary | ICD-10-CM | POA: Diagnosis not present

## 2021-04-28 NOTE — Progress Notes (Signed)
[facility] ?[address] ?[place], [place] [address] ? ?Pulmonary Sleep Medicine  ? ?Office Visit Note ? ?Patient Name: Yvonne Davidson ?DOB: 1940-09-11 ?MRN [id number] ? ?Date of Service: 04/28/2021 ? ?Complaints/HPI: I'm Tired--Patient states that she was diagnosed with sleep apnea and has been on the PAP therapy for a several years. Patient states she is still tired she had a download with her and shows that her compliance of 100%. Her AHI was 1.6 per hour over the last 90 days. She does incidentally have a history of hypothyroid and has gone off of the synthroid.  ? ?ROS ? ?General: (-) fever, (-) chills, (-) night sweats, (-) weakness ?Skin: (-) rashes, (-) itching,. ?Eyes: (-) visual changes, (-) redness, (-) itching. ?Nose and Sinuses: (-) nasal stuffiness or itchiness, (-) postnasal drip, (-) nosebleeds, (-) sinus trouble. ?Mouth and Throat: (-) sore throat, (-) hoarseness. ?Neck: (-) swollen glands, (-) enlarged thyroid, (-) neck pain. ?Respiratory: - cough, (-) bloody sputum, - shortness of breath, - wheezing. ?Cardiovascular: - ankle swelling, (-) chest pain. ?Lymphatic: (-) lymph node enlargement. ?Neurologic: (-) numbness, (-) tingling. ?Psychiatric: (-) anxiety, (-) depression ? ? ?Current Medication: ?Outpatient Encounter Medications as of 04/28/2021  ?Medication Sig  ? B-D ULTRA-FINE 33 LANCETS MISC Use as directed. Dx 250.0  ? Blood Glucose Monitoring Suppl (ONE TOUCH ULTRA MINI) W/DEVICE KIT USE AS DIRECTED  ? cloNIDine (CATAPRES) 0.1 MG tablet Take 1 tablet (0.1 mg total) by mouth 2 (two) times daily.  ? ezetimibe (ZETIA) 10 MG tablet Take 1 tablet (10 mg total) by mouth daily.  ? metFORMIN (GLUCOPHAGE) 500 MG tablet Take 250 mg by mouth 2 (two) times daily with a meal.  ? nebivolol (BYSTOLIC) 10 MG tablet Take 1 tablet (10 mg total) by mouth daily.  ? olmesartan (BENICAR) 40 MG tablet Take 1 tablet (40 mg total) by mouth daily.  ? ONETOUCH DELICA LANCETS 79J MISC Use as directed. Dx  250.0  ? pantoprazole (PROTONIX) 20 MG tablet Take 20 mg by mouth daily.  ? traMADol (ULTRAM) 50 MG tablet Take 1 tablet (50 mg total) by mouth every 6 (six) hours as needed.  ? ?No facility-administered encounter medications on file as of 04/28/2021.  ? ? ?Surgical History: ?Past Surgical History:  ?Procedure Laterality Date  ? CHOLECYSTECTOMY  12/07/2012  ? Dr Lucia Gaskins  ? CHOLECYSTECTOMY N/A 12/07/2012  ? Procedure: LAPAROSCOPIC CHOLECYSTECTOMY WITH INTRAOPERATIVE CHOLANGIOGRAM;  Surgeon: Shann Medal, MD;  Location: [facility];  Service: General;  Laterality: N/A;  ? LIPOSUCTION    ? NASAL SINUS SURGERY  2006  ? x 2  ? RENAL ANGIOGRAPHY Right 04/14/2020  ? Procedure: RENAL ANGIOGRAPHY;  Surgeon: Algernon Huxley, MD;  Location: [facility];  Service: Cardiovascular;  Laterality: Right;  ? right arm surgery  1960  ? TCS    ? ? ?Medical History: ?Past Medical History:  ?Diagnosis Date  ? Allergy   ? Anemia   ? Anxiety   ? Arthritis   ? Bilateral cataracts   ? immature  ? Chronic sinusitis   ? takes Cetirizine daily  ? Depression   ? doesn't take any meds  ? Diabetes mellitus without complication ([place])   ? borderline  ? Diverticulosis   ? Dry eyes   ? GERD (gastroesophageal reflux disease)   ? TAKES OMEPRAZOLE DAILY  ? Hashimoto's thyroiditis   ? History of blood transfusion   ? in the 60's no abnormal reaction noted  ? History of  echocardiogram   ? a. 05/2011: EF 60%, trace AI, trace MR, mild TR  ? Hyperlipidemia   ? a. self discontinued statin  ? Hypertension   ? Insomnia   ? takes Trazodone nightly  ? Medication intolerance   ? OSA (obstructive sleep apnea)   ? doesn't use a cpap  ? Osteoporosis   ? takes Vit D as needed  ? PONV (postoperative nausea and vomiting)   ? hard to wake up  ? ? ?Family History: ?Family History  ?Problem Relation Age of Onset  ? Arthritis Mother   ? Heart disease Mother   ? Hyperlipidemia Mother   ? Hypertension Mother   ? Stroke Mother   ? Cancer Mother   ?     Breast & Uterine - 20'-30's   ? Aneurysm Mother   ? Breast cancer Mother   ? Early death Father   ?     Fire  ? Alcohol abuse Father   ? Alzheimer's disease Brother   ? Rheum arthritis Brother   ? ? ?Social History: ?Social History  ? ?Socioeconomic History  ? Marital status: Married  ?  Spouse name: Not on file  ? Number of children: 4  ? Years of education: Not on file  ? Highest education level: Not on file  ?Occupational History  ? 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 Smoking status: Never  ? Smokeless tobacco: Never  ? Tobacco comments:  ?  quit in 1978  ?Vaping Use  ? Vaping Use: Never used  ?Substance and Sexual Activity  ? Alcohol use: No  ?  Alcohol/week: 0.0 standard drinks  ? Drug use: No  ? Sexual activity: Not on file  ?Other Topics Concern  ? Not on file  ?Social History Narrative  ? Lives with friend. Has 4 children.  ? ?Social Determinants of Health  ? ?Financial Resource Strain: Not on file  ?Food Insecurity: Not on file  ?Transportation Needs: Not on file  ?Physical Activity: Not on file  ?Stress: Not on file  ?Social Connections: Not on file  ?Intimate Partner Violence: Not on file  ? ? ?Vital Signs: ?Blood pressure (!) 146/64, resp. rate 16, height _0  (1.575 m), weight 154 lb 9.6 oz (70.1 kg). ? ?Examination: ?General Appearance: The patient is well-developed, well-nourished, and in no distress. ?Skin: Gross inspection of skin unremarkable. ?Head: normocephalic, no gross deformities. ?Eyes: no gross deformities noted. ?ENT: ears appear grossly normal no exudates. ?Neck: Supple. No thyromegaly. No LAD. ?Respiratory: no rhonchi noted. ?Cardiovascular: Normal S1 and S2 without murmur or rub. ?Extremities: No cyanosis. pulses are equal. ?Neurologic: Alert and oriented. No involuntary movements. ? ?LABS: ?No results found for this or any previous visit (from the past 2160 hour(s)). ? ?Radiology: ?MM 3D SCREEN BREAST BILATERAL ? ?Result Date: 04/21/2021 ?CLINICAL DATA:  Screening. EXAM: DIGITAL SCREENING  BILATERAL MAMMOGRAM WITH TOMOSYNTHESIS AND CAD TECHNIQUE: Bilateral screening digital craniocaudal and mediolateral oblique mammograms were obtained. Bilateral screening digital breast tomosynthesis was performed. The images were evaluated with computer-aided detection. COMPARISON:  Previous exam(s). ACR Breast Density Category b: There are scattered areas of fibroglandular density. FINDINGS: There are no findings suspicious for malignancy. IMPRESSION: No mammographic evidence of malignancy. A result letter of this screening mammogram will be mailed directly to the patient. RECOMMENDATION: Screening mammogram in one year. (Code:SM-B-01Y) BI-RADS CATEGORY  1: Negative. Electronically Signed   By: Dorise Bullion III M.D.   On: 04/21/2021 19:08  ? ? ?No results  found. ? ?MM 3D SCREEN BREAST BILATERAL ? ?Result Date: 04/21/2021 ?CLINICAL DATA:  Screening. EXAM: DIGITAL SCREENING BILATERAL MAMMOGRAM WITH TOMOSYNTHESIS AND CAD TECHNIQUE: Bilateral screening digital craniocaudal and mediolateral oblique mammograms were obtained. Bilateral screening digital breast tomosynthesis was performed. The images were evaluated with computer-aided detection. COMPARISON:  Previous exam(s). ACR Breast Density Category b: There are scattered areas of fibroglandular density. FINDINGS: There are no findings suspicious for malignancy. IMPRESSION: No mammographic evidence of malignancy. A result letter of this screening mammogram will be mailed directly to the patient. RECOMMENDATION: Screening mammogram in one year. (Code:SM-B-01Y) BI-RADS CATEGORY  1: Negative. Electronically Signed   By: Dorise Bullion III M.D.   On: 04/21/2021 19:08   ? ? ? ?Assessment and Plan: ?Patient Active Problem List  ? Diagnosis Date Noted  ? Disorder of bursae of shoulder region 05/13/2020  ? Gastroesophageal reflux disease with esophagitis without hemorrhage 05/13/2020  ? 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 Body mass index (BMI) 30.0-30.9, adult 11/29/2019  ? Body mass index (BMI) 28.0-28.9, adult 10/25/2019  ? Subdural hemorrhage ([place]) 10/24/2019  ? [facility]ero

## 2021-04-28 NOTE — Telephone Encounter (Signed)
SS ordered. Printed. Gave to [place]-Toni ?

## 2021-04-29 ENCOUNTER — Encounter: Payer: Self-pay | Admitting: Internal Medicine

## 2021-04-29 DIAGNOSIS — E039 Hypothyroidism, unspecified: Secondary | ICD-10-CM | POA: Diagnosis not present

## 2021-04-30 LAB — TSH+FREE T4
Free T4: 0.99 ng/dL (ref 0.82–1.77)
TSH: 5.44 u[IU]/mL — ABNORMAL HIGH (ref 0.450–4.500)

## 2021-05-05 DIAGNOSIS — E782 Mixed hyperlipidemia: Secondary | ICD-10-CM | POA: Diagnosis not present

## 2021-05-05 DIAGNOSIS — E785 Hyperlipidemia, unspecified: Secondary | ICD-10-CM | POA: Diagnosis not present

## 2021-05-05 DIAGNOSIS — I10 Essential (primary) hypertension: Secondary | ICD-10-CM | POA: Diagnosis not present

## 2021-05-05 DIAGNOSIS — E1169 Type 2 diabetes mellitus with other specified complication: Secondary | ICD-10-CM | POA: Diagnosis not present

## 2021-05-05 DIAGNOSIS — E038 Other specified hypothyroidism: Secondary | ICD-10-CM | POA: Diagnosis not present

## 2021-05-08 ENCOUNTER — Telehealth: Payer: Self-pay

## 2021-05-08 NOTE — Telephone Encounter (Signed)
Left vm to confirm 05/13/21 appointment-Toni ?

## 2021-05-11 DIAGNOSIS — K551 Chronic vascular disorders of intestine: Secondary | ICD-10-CM | POA: Diagnosis not present

## 2021-05-11 DIAGNOSIS — E1169 Type 2 diabetes mellitus with other specified complication: Secondary | ICD-10-CM | POA: Diagnosis not present

## 2021-05-11 DIAGNOSIS — Z0000 Encounter for general adult medical examination without abnormal findings: Secondary | ICD-10-CM | POA: Diagnosis not present

## 2021-05-11 DIAGNOSIS — E782 Mixed hyperlipidemia: Secondary | ICD-10-CM | POA: Diagnosis not present

## 2021-05-11 DIAGNOSIS — E785 Hyperlipidemia, unspecified: Secondary | ICD-10-CM | POA: Diagnosis not present

## 2021-05-11 DIAGNOSIS — E038 Other specified hypothyroidism: Secondary | ICD-10-CM | POA: Diagnosis not present

## 2021-05-11 DIAGNOSIS — I10 Essential (primary) hypertension: Secondary | ICD-10-CM | POA: Diagnosis not present

## 2021-05-11 DIAGNOSIS — E538 Deficiency of other specified B group vitamins: Secondary | ICD-10-CM | POA: Diagnosis not present

## 2021-05-13 ENCOUNTER — Ambulatory Visit: Payer: PPO | Admitting: Internal Medicine

## 2021-05-13 DIAGNOSIS — R0602 Shortness of breath: Secondary | ICD-10-CM

## 2021-05-25 DIAGNOSIS — G4733 Obstructive sleep apnea (adult) (pediatric): Secondary | ICD-10-CM | POA: Diagnosis not present

## 2021-05-27 NOTE — Procedures (Signed)
[facility] ?[address] ?[place], [address] ? ? ? ?Complete Pulmonary Function Testing Interpretation: ? ?FINDINGS: ? ?Forced vital capacity is normal.  FEV1 is normal.  Fev1 FVC ratio was mildly decreased.  Postbronchodilator no significant change in FEV1.  Total lung capacity is normal.  Residual volume is normal.  FRC is decreased.  DLCO was normal. ? ?IMPRESSION: ? ?Pulmonary function study is basically within normal limits clinical correlation is recommended ? ?Allyne Gee, MD FCCP ?Pulmonary Critical Care Medicine ?Sleep Medicine ? ?

## 2021-05-29 ENCOUNTER — Encounter: Payer: Self-pay | Admitting: Internal Medicine

## 2021-05-29 LAB — PULMONARY FUNCTION TEST

## 2021-06-01 ENCOUNTER — Telehealth: Payer: Self-pay

## 2021-06-01 NOTE — Telephone Encounter (Signed)
lvm to move 06/25/21 appointment-tw ?

## 2021-06-11 ENCOUNTER — Ambulatory Visit: Payer: PPO | Admitting: Internal Medicine

## 2021-06-11 ENCOUNTER — Encounter: Payer: Self-pay | Admitting: Internal Medicine

## 2021-06-11 ENCOUNTER — Telehealth: Payer: Self-pay

## 2021-06-11 VITALS — BP 170/60 | HR 51 | Temp 98.1°F | Resp 16 | Ht 62.0 in | Wt 152.6 lb

## 2021-06-11 DIAGNOSIS — E039 Hypothyroidism, unspecified: Secondary | ICD-10-CM

## 2021-06-11 DIAGNOSIS — G4733 Obstructive sleep apnea (adult) (pediatric): Secondary | ICD-10-CM | POA: Diagnosis not present

## 2021-06-11 NOTE — Telephone Encounter (Signed)
Awaiting 06/11/21 office notes for SS order-Toni

## 2021-06-11 NOTE — Patient Instructions (Signed)
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
## 2021-06-15 ENCOUNTER — Other Ambulatory Visit: Payer: Self-pay | Admitting: Physician Assistant

## 2021-06-15 ENCOUNTER — Ambulatory Visit
Admission: RE | Admit: 2021-06-15 | Discharge: 2021-06-15 | Disposition: A | Discharge status: Home / Self Care | Payer: HMO | Source: Ambulatory Visit | Attending: Physician Assistant | Admitting: Physician Assistant

## 2021-06-15 DIAGNOSIS — R202 Paresthesia of skin: Secondary | ICD-10-CM | POA: Insufficient documentation

## 2021-06-15 DIAGNOSIS — R200 Anesthesia of skin: Secondary | ICD-10-CM | POA: Insufficient documentation

## 2021-06-15 DIAGNOSIS — E1159 Type 2 diabetes mellitus with other circulatory complications: Secondary | ICD-10-CM | POA: Diagnosis not present

## 2021-06-15 DIAGNOSIS — I10 Essential (primary) hypertension: Secondary | ICD-10-CM | POA: Diagnosis not present

## 2021-06-19 NOTE — Telephone Encounter (Signed)
SS order placed in [facility] folder-Toni 

## 2021-06-24 DIAGNOSIS — G4733 Obstructive sleep apnea (adult) (pediatric): Secondary | ICD-10-CM | POA: Diagnosis not present

## 2021-06-25 ENCOUNTER — Ambulatory Visit: Payer: PPO | Admitting: Internal Medicine

## 2021-06-30 ENCOUNTER — Telehealth: Payer: Self-pay

## 2021-06-30 NOTE — Telephone Encounter (Signed)
Patient called wanting to know why her sleep study has not been scheduled yet. Per pt "she states that she's allergic to the gel/paste that is used for sleep studies"so I called Helene Kelp with [facility] and states that she has spoken with the patient several times regarding this "special" paste that has to be ordered for this one patient. She has been advised by Helene Kelp that she will be scheduled as soon as this paste comes in. Tat  *also spoke with Ashly ([facility]) in getting her a cpap/bipap.

## 2021-07-01 ENCOUNTER — Telehealth: Payer: Self-pay

## 2021-07-01 ENCOUNTER — Encounter: Payer: Self-pay | Admitting: Nurse Practitioner

## 2021-07-01 ENCOUNTER — Ambulatory Visit: Payer: PPO | Admitting: Nurse Practitioner

## 2021-07-01 VITALS — BP 140/70 | HR 46 | Temp 98.3°F | Resp 16 | Ht 62.5 in | Wt 152.8 lb

## 2021-07-01 DIAGNOSIS — Z789 Other specified health status: Secondary | ICD-10-CM | POA: Diagnosis not present

## 2021-07-01 DIAGNOSIS — G4719 Other hypersomnia: Secondary | ICD-10-CM

## 2021-07-01 DIAGNOSIS — R001 Bradycardia, unspecified: Secondary | ICD-10-CM

## 2021-07-01 DIAGNOSIS — G4733 Obstructive sleep apnea (adult) (pediatric): Secondary | ICD-10-CM

## 2021-07-01 NOTE — Telephone Encounter (Signed)
Bipap order faxed to [facility]  and send message to ashly from [facility]

## 2021-07-01 NOTE — Progress Notes (Signed)
[facility] [place], [place] [address]  Internal MEDICINE  Office Visit Note  Patient Name: Yvonne Davidson  [id number]  [id number]  Date of Service: 07/01/2021  Chief Complaint  Patient presents with   Follow-up    Bipap order    HPI Avaleen presents for follow-up visit for a BiPAP order.  Patient has been on CPAP and has been compliant but often feels like she is suffocating and even though she uses her CPAP every night she still wakes up very sleepy and exhausted in the morning and sleeps a lot during the day.  She has previously spoken with Dr. Devona Konig and it was determined that she would try BiPAP.  Initially it was thought she would have to do a sleep study again but Dr. Devona Konig said she could go on BiPAP without the sleep study because of her uncontrolled symptoms while on CPAP.  EPWORTH SLEEPINESS SCALE: Scale: (0)= no chance of dozing; (1)= slight chance of dozing; (2)= moderate chance of dozing; (3)= high chance of dozing Chance  Situtation Sitting and reading: 3 Watching TV: 3 Sitting Inactive in public: 0 As a passenger in car: 3   Lying down to rest: 3 Sitting and talking: 0 Sitting quielty after lunch: 2 In a car, stopped in traffic: 2 TOTAL SCORE:   16 out of 24   Current Medication: Outpatient Encounter Medications as of 07/01/2021  Medication Sig   B-D ULTRA-FINE 33 LANCETS MISC Use as directed. Dx 250.0   Blood Glucose Monitoring Suppl (ONE TOUCH ULTRA MINI) W/DEVICE KIT USE AS DIRECTED   cloNIDine (CATAPRES) 0.1 MG tablet Take 1 tablet (0.1 mg total) by mouth 2 (two) times daily.   ezetimibe (ZETIA) 10 MG tablet Take 1 tablet (10 mg total) by mouth daily.   metFORMIN (GLUCOPHAGE) 500 MG tablet Take 250 mg by mouth 2 (two) times daily with a meal.   nebivolol (BYSTOLIC) 10 MG tablet Take 1 tablet (10 mg total) by mouth daily.   olmesartan (BENICAR) 40 MG tablet Take 1 tablet (40 mg total) by mouth daily.   ONETOUCH DELICA  LANCETS 14G MISC Use as directed. Dx 250.0   pantoprazole (PROTONIX) 20 MG tablet Take 20 mg by mouth daily.   traMADol (ULTRAM) 50 MG tablet Take 1 tablet (50 mg total) by mouth every 6 (six) hours as needed.   No facility-administered encounter medications on file as of 07/01/2021.    Surgical History: Past Surgical History:  Procedure Laterality Date   CHOLECYSTECTOMY  12/07/2012   Dr Lucia Gaskins   CHOLECYSTECTOMY N/A 12/07/2012   Procedure: LAPAROSCOPIC CHOLECYSTECTOMY WITH INTRAOPERATIVE CHOLANGIOGRAM;  Surgeon: Shann Medal, MD;  Location: [facility];  Service: General;  Laterality: N/A;   LIPOSUCTION     NASAL SINUS SURGERY  2006   x 2   RENAL ANGIOGRAPHY Right 04/14/2020   Procedure: RENAL ANGIOGRAPHY;  Surgeon: Algernon Huxley, MD;  Location: [facility];  Service: Cardiovascular;  Laterality: Right;   right arm surgery  1960   TCS      Medical History: Past Medical History:  Diagnosis Date   Allergy    Anemia    Anxiety    Arthritis    Bilateral cataracts    immature   Chronic sinusitis    takes Cetirizine daily   Depression    doesn't take any meds   Diabetes mellitus without complication ([place])    borderline   Diverticulosis    Dry eyes  GERD (gastroesophageal reflux disease)    TAKES OMEPRAZOLE DAILY   Hashimoto's thyroiditis    History of blood transfusion    in the 60's no abnormal reaction noted   History of echocardiogram    a. 05/2011: EF 60%, trace AI, trace MR, mild TR   Hyperlipidemia    a. self discontinued statin   Hypertension    Insomnia    takes Trazodone nightly   Medication intolerance    OSA (obstructive sleep apnea)    doesn't use a cpap   Osteoporosis    takes Vit D as needed   PONV (postoperative nausea and vomiting)    hard to wake up    Family History: Family History  Problem Relation Age of Onset   Arthritis Mother    Heart disease Mother    Hyperlipidemia Mother    Hypertension Mother    Stroke Mother    Cancer Mother         Breast & Uterine - 20'-30's   Aneurysm Mother    Breast cancer Mother    Early death Father        Fire   Alcohol abuse Father    Alzheimer's disease Brother    Rheum arthritis Brother     Social History   Socioeconomic History   Marital status: Married    Spouse name: Not on file   Number of children: 4   Years of education: Not on file   Highest education level: Not on file  Occupational History   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 Has 4 children.   Social Determinants of Health   Financial Resource Strain: Not on file  Food Insecurity: Not on file  Transportation Needs: Not on file  Physical Activity: Not on file  Stress: Not on file  Social Connections: Not on file  Intimate Partner Violence: Not on file      Review of Systems  Constitutional:  Positive for fatigue. Negative for chills and unexpected weight change.  HENT:  Negative for congestion, rhinorrhea, sneezing and sore throat.   Respiratory:  Positive for apnea (AHI was not controlled on CPAP). Negative for cough, chest tightness, shortness of breath and wheezing.   Cardiovascular: Negative.  Negative for chest pain and palpitations.  Neurological: Negative.  Negative for tremors and numbness.  Psychiatric/Behavioral:  Positive for sleep disturbance. Negative for behavioral problems (Depression).     Vital Signs: BP 140/70   Pulse (!) 46   Temp 98.3 F (36.8 C)   Resp 16   Ht 5' 2.5" (1.588 m)   Wt 152 lb 12.8 oz (69.3 kg)   SpO2 98%   BMI 27.50 kg/m    Physical Exam Vitals reviewed.  Constitutional:      General:  She is not in acute distress.    Appearance: Normal appearance. She is not ill-appearing.  HENT:     Head: Normocephalic and atraumatic.  Eyes:     Pupils: Pupils are equal, round, and reactive to light.  Cardiovascular:  Rate and Rhythm: Normal rate and regular rhythm.  Pulmonary:     Effort: Pulmonary effort is normal. No respiratory distress.  Neurological:     Mental Status: She is alert and oriented to person, place, and time.  Psychiatric:        Mood and Affect: Mood normal.        Behavior: Behavior normal.        Assessment/Plan: 1. OSA (obstructive sleep apnea) BiPAP ordered for home use, order faxed over by her DME provider was signed and faxed back to get the order started for her - For home use only DME Bipap  2. Excessive daytime sleepiness Epworth Sleepiness Scale score was 16 out of 24 while she has been on CPAP, BiPAP ordered for patient, she is anxious to see if this BiPAP machine helps her sleep better - For home use only DME Bipap  3. Difficulty with CPAP use Patient has not been receiving adequate treatment with CPAP and continues to wake up feeling exhausted and falls asleep throughout the day, BiPAP machine ordered follow-up in a few months. - For home use only DME Bipap  4. Asymptomatic bradycardia Heart rate 46 in the office today, discussed with patient and she reports that she does have a low resting heart rate.  She is asymptomatic and does not show any symptoms related to the low heart rate   General Counseling: rhilyn battle understanding of the findings of todays visit and agrees with plan of treatment. I have discussed any further diagnostic evaluation that may be needed or ordered today. We also reviewed her medications today. she has been encouraged to call the office with any questions or concerns that should arise related to todays visit.    Orders Placed This Encounter  Procedures   For home use only DME Bipap    No orders of  the defined types were placed in this encounter.   Return in about 3 months (around 10/01/2021) for F/U, pulmonary/sleep need follow-up 3 months after starting the BiPAP machine DSK or Dwan Hemmelgarn.   Total time spent:30 Minutes Time spent includes review of chart, medications, test results, and follow up plan with the patient.   [place] Controlled Substance Database was reviewed by me.  This patient was seen by Jonetta Osgood, FNP-C in collaboration with Dr. Clayborn Bigness as a part of collaborative care agreement.   Jesiel Garate R. Valetta Fuller, MSN, FNP-C Internal medicine

## 2021-07-02 ENCOUNTER — Telehealth: Payer: Self-pay

## 2021-07-02 NOTE — Telephone Encounter (Signed)
Per [facility] patient is declining to schedule sleep study. "Pt states that her dr wants to put sleep study on hold"/tat

## 2021-07-05 ENCOUNTER — Encounter: Payer: Self-pay | Admitting: Nurse Practitioner

## 2021-07-07 ENCOUNTER — Telehealth: Payer: Self-pay

## 2021-07-07 NOTE — Telephone Encounter (Signed)
Per Helene Kelp w/ [facility], patient stated provider wanted to hold off on scheduling SS-Toni

## 2021-07-08 ENCOUNTER — Encounter: Payer: Self-pay | Admitting: Nurse Practitioner

## 2021-07-16 DIAGNOSIS — G4733 Obstructive sleep apnea (adult) (pediatric): Secondary | ICD-10-CM | POA: Diagnosis not present

## 2021-07-29 ENCOUNTER — Emergency Department
Admission: EM | Admit: 2021-07-29 | Discharge: 2021-07-29 | Disposition: A | Discharge status: Home / Self Care | Payer: HMO | Attending: Emergency Medicine | Admitting: Emergency Medicine

## 2021-07-29 ENCOUNTER — Other Ambulatory Visit: Payer: Self-pay

## 2021-07-29 ENCOUNTER — Encounter: Payer: Self-pay | Admitting: Emergency Medicine

## 2021-07-29 DIAGNOSIS — E119 Type 2 diabetes mellitus without complications: Secondary | ICD-10-CM | POA: Diagnosis not present

## 2021-07-29 DIAGNOSIS — R42 Dizziness and giddiness: Secondary | ICD-10-CM | POA: Diagnosis present

## 2021-07-29 DIAGNOSIS — I10 Essential (primary) hypertension: Secondary | ICD-10-CM | POA: Insufficient documentation

## 2021-07-29 DIAGNOSIS — E1159 Type 2 diabetes mellitus with other circulatory complications: Secondary | ICD-10-CM | POA: Diagnosis not present

## 2021-07-29 DIAGNOSIS — R001 Bradycardia, unspecified: Secondary | ICD-10-CM | POA: Diagnosis not present

## 2021-07-29 DIAGNOSIS — R748 Abnormal levels of other serum enzymes: Secondary | ICD-10-CM | POA: Diagnosis not present

## 2021-07-29 LAB — TROPONIN I (HIGH SENSITIVITY)
Troponin I (High Sensitivity): 8 ng/L (ref <18)
Troponin I (High Sensitivity): 6 ng/L (ref <18)

## 2021-07-29 LAB — CBC WITH DIFFERENTIAL/PLATELET
Abs Immature Granulocytes: 0.03 10*3/uL (ref 0.00–0.07)
Basophils Absolute: 0 10*3/uL (ref 0.0–0.1)
Basophils Relative: 1 %
Eosinophils Absolute: 0.2 10*3/uL (ref 0.0–0.5)
Eosinophils Relative: 3 %
HCT: 39.2 % (ref 36.0–46.0)
Hemoglobin: 12.3 g/dL (ref 12.0–15.0)
Immature Granulocytes: 0 %
Lymphocytes Relative: 33 %
Lymphs Abs: 2.8 10*3/uL (ref 0.7–4.0)
MCH: 29.4 pg (ref 26.0–34.0)
MCHC: 31.4 g/dL (ref 30.0–36.0)
MCV: 93.6 fL (ref 80.0–100.0)
Monocytes Absolute: 0.8 10*3/uL (ref 0.1–1.0)
Monocytes Relative: 10 %
Neutro Abs: 4.6 10*3/uL (ref 1.7–7.7)
Neutrophils Relative %: 53 %
Platelets: 262 10*3/uL (ref 150–400)
RBC: 4.19 MIL/uL (ref 3.87–5.11)
RDW: 13.2 % (ref 11.5–15.5)
WBC: 8.4 10*3/uL (ref 4.0–10.5)
nRBC: 0 % (ref 0.0–0.2)

## 2021-07-29 LAB — COMPREHENSIVE METABOLIC PANEL
ALT: 17 U/L (ref 0–44)
AST: 17 U/L (ref 15–41)
Albumin: 4.1 g/dL (ref 3.5–5.0)
Alkaline Phosphatase: 56 U/L (ref 38–126)
Anion gap: 7 (ref 5–15)
BUN: 22 mg/dL (ref 8–23)
CO2: 26 mmol/L (ref 22–32)
Calcium: 9.3 mg/dL (ref 8.9–10.3)
Chloride: 107 mmol/L (ref 98–111)
Creatinine, Ser: 1.02 mg/dL — ABNORMAL HIGH (ref 0.44–1.00)
GFR, Estimated: 55 mL/min — ABNORMAL LOW (ref >60)
Glucose, Bld: 173 mg/dL — ABNORMAL HIGH (ref 70–99)
Potassium: 4 mmol/L (ref 3.5–5.1)
Sodium: 140 mmol/L (ref 135–145)
Total Bilirubin: 0.5 mg/dL (ref 0.3–1.2)
Total Protein: 7.2 g/dL (ref 6.5–8.1)

## 2021-07-29 LAB — LIPASE, BLOOD: Lipase: 157 U/L — ABNORMAL HIGH (ref 11–51)

## 2021-07-29 NOTE — ED Triage Notes (Signed)
Pt to ED via [facility] with c/o HTN, pt states "my  blood pressure is out of control". Pt states CBG 222 at home after eating ice cream and potato chips. Pt states took Clonidine at 2315 0.'1mg'$ , and 0.25 tablets of metformin. Pt states nauseated at this time, feels weak and dizzy. Pt also noted to be burping in triage.

## 2021-07-29 NOTE — Discharge Instructions (Addendum)
Continue to take your medication as prescribed.  Your blood work was reassuring today.

## 2021-07-29 NOTE — ED Triage Notes (Signed)
EMS brings pt in from home for c/o dizziness, HTN and hyperglycemia tonight
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
## 2021-07-30 ENCOUNTER — Ambulatory Visit: Payer: HMO | Admitting: Medical

## 2021-07-31 DIAGNOSIS — G4733 Obstructive sleep apnea (adult) (pediatric): Secondary | ICD-10-CM | POA: Diagnosis not present

## 2021-08-06 ENCOUNTER — Encounter: Payer: Self-pay | Admitting: Internal Medicine

## 2021-08-06 ENCOUNTER — Ambulatory Visit: Payer: PPO | Admitting: Internal Medicine

## 2021-08-06 ENCOUNTER — Telehealth: Payer: Self-pay

## 2021-08-06 VITALS — BP 150/44 | HR 53 | Temp 98.4°F | Resp 16 | Ht 62.0 in | Wt 147.6 lb

## 2021-08-06 DIAGNOSIS — G4733 Obstructive sleep apnea (adult) (pediatric): Secondary | ICD-10-CM | POA: Diagnosis not present

## 2021-08-06 DIAGNOSIS — Z7189 Other specified counseling: Secondary | ICD-10-CM | POA: Diagnosis not present

## 2021-08-06 DIAGNOSIS — Z789 Other specified health status: Secondary | ICD-10-CM
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
## 2021-08-06 NOTE — Patient Instructions (Signed)

## 2021-08-06 NOTE — Telephone Encounter (Signed)
Awaiting 08/06/21 office notes for cpap supply to send to [facility]

## 2021-08-11 ENCOUNTER — Telehealth: Payer: Self-pay

## 2021-08-11 NOTE — Telephone Encounter (Signed)
SS order placed in [facility] folder-Toni 

## 2021-08-11 NOTE — Telephone Encounter (Signed)
[facility] requesting SS report. I spoke with patient. She will try to remember where she had done and call me back-Toni

## 2021-08-15 DIAGNOSIS — G4733 Obstructive sleep apnea (adult) (pediatric): Secondary | ICD-10-CM | POA: Diagnosis not present

## 2021-08-17 DIAGNOSIS — E538 Deficiency of other specified B group vitamins: Secondary | ICD-10-CM | POA: Diagnosis not present

## 2021-08-17 DIAGNOSIS — E782 Mixed hyperlipidemia: Secondary | ICD-10-CM | POA: Diagnosis not present

## 2021-08-17 DIAGNOSIS — E785 Hyperlipidemia, unspecified: Secondary | ICD-10-CM | POA: Diagnosis not present

## 2021-08-17 DIAGNOSIS — E1169 Type 2 diabetes mellitus with other specified complication: Secondary | ICD-10-CM | POA: Diagnosis not present

## 2021-08-17 DIAGNOSIS — E038 Other specified hypothyroidism: Secondary | ICD-10-CM | POA: Diagnosis not present

## 2021-08-18 DIAGNOSIS — E1169 Type 2 diabetes mellitus with other specified complication: Secondary | ICD-10-CM | POA: Diagnosis not present

## 2021-08-18 DIAGNOSIS — E038 Other specified hypothyroidism: Secondary | ICD-10-CM | POA: Diagnosis not present

## 2021-08-18 DIAGNOSIS — E538 Deficiency of other specified B group vitamins: Secondary | ICD-10-CM | POA: Diagnosis not present

## 2021-08-18 DIAGNOSIS — E785 Hyperlipidemia, unspecified: Secondary | ICD-10-CM | POA: Diagnosis not present

## 2021-08-18 DIAGNOSIS — E782 Mixed hyperlipidemia: Secondary | ICD-10-CM | POA: Diagnosis not present

## 2021-08-18 DIAGNOSIS — I10 Essential (primary) hypertension: Secondary | ICD-10-CM | POA: Diagnosis not present

## 2021-08-31 ENCOUNTER — Telehealth: Payer: Self-pay

## 2021-08-31 NOTE — Telephone Encounter (Signed)
Patient called this morning stating she has not heard back from [place] w/ [facility]. I lvm for Helene Kelp to return my call-Toni

## 2021-08-31 NOTE — Telephone Encounter (Signed)
Patient came into office on Friday 08/28/21 stating her BP is running high at night even though she is taking her BP meds. She believes it is because of her cpap/bipap. She stated she still cannot remember where she had her SS. I s/w [facility], asked if patient could just be scheduled as "brand new". I gave patient their telephone # to call Helene Kelp to schedule SS-Toni

## 2021-09-01 ENCOUNTER — Telehealth: Payer: Self-pay

## 2021-09-01 NOTE — Telephone Encounter (Signed)
Received titration review and advise from [place]. Faxed back advise to [place] [facility]

## 2021-09-02 ENCOUNTER — Encounter (INDEPENDENT_AMBULATORY_CARE_PROVIDER_SITE_OTHER): Payer: PPO | Admitting: Internal Medicine

## 2021-09-02 DIAGNOSIS — G4719 Other hypersomnia: Secondary | ICD-10-CM | POA: Diagnosis not present

## 2021-09-07 NOTE — Procedures (Signed)
[place] Report Part I                                                                 Phone: [phone] Fax: [phone]  Patient Name: Yvonne Davidson, Yvonne Davidson Acquisition Number: [id number]  Date of Birth: 1940/09/20 Acquisition Date: 09/02/2021  Referring Physician: Devona Konig, MD     History: The patient is a 81 year old female who was referred for evaluation of possible sleep apnea. Medical History: anemia, anxiety, arthritis, bilateral cataracts, chronic sinusitis, depression, diabetes, diverticulosis, dry eyes, GERD, Hashimoto's thyroiditis, hyperlipidemia, hypertension, insomnia, OSA, osteoporosis, PONV.  Medications: clonidine, ezetimibe, metformin, nebivolol, olmesartan, pantoprazole.  Procedure: This routine overnight polysomnogram was performed on the [facility] 5 using the standard diagnostic protocol. This included 6 channels of EEG, 2 channels of EOG, chin EMG, bilateral anterior tibialis EMG, nasal/oral thermistor, PTAF (nasal pressure transducer), chest and abdominal wall movements, EKG, and pulse oximetry.  Description: The total recording time was 360.6 minutes. The total sleep time was 301.5 minutes. There were a total of 49.0 minutes of wakefulness after sleep onset for a reducedsleep efficiency of 83.6%. The latency to sleep onset was within normal limitsat 10.1 minutes. The R sleep onset latency was short at 49.5 minutes. Sleep parameters, as a percentage of the total sleep time, demonstrated 10.8% of sleep was in N1 sleep, 71.5% N2, 2.0% N3 and 15.8% R sleep. There were a total of 42 arousals for an arousal index of 8.4 arousals per hour of sleep that was normal.  Respiratory monitoring demonstrated no significant snoring. The patient slept on her back in a recliner during the study. There were 31 apneas and hypopneas for an Apnea Hypopnea Index of 6.2 apneas and hypopneas per hour of sleep. The REM related apnea hypopnea index was 27.8/hr of REM  sleep compared to a NREM AHI of 2.1/hr. The Respiratory Disturbance Index, which includes 2 respiratory effort related arousals (RERAs), was 6.6 respiratory events per hour of sleep.  The average duration of the respiratory events was 27.1 seconds with a maximum duration of 46.5 seconds. The respiratory events were associated with peripheral oxygen desaturations on the average to 90%. The lowest oxygen desaturation associated with a respiratory event was 87%. Additionally, the baseline oxygen saturation during wakefulness was 93%, during NREM sleep averaged 94%, and during REM sleep averaged  94%. The total duration of oxygen < 90% was 3.0 minutes.  Cardiac monitoring- did not demonstrate transient cardiac decelerations associated with the apneas. There were no significant cardiac rhythm irregularities.   Periodic limb movement monitoring- demonstrated that there were 159 periodic limb movements for a periodic limb movement index of 31.6 periodic limb movements per hour of sleep.   Impression: This routine overnight polysomnogram demonstrated significant, REM-related  obstructive sleep apnea with an overall Apnea Hypopnea Index of 6.2 apneas and hypopneas per hour of sleep, which increased to 27.8 in REM sleep. The lowest desaturation to 87%.  As REM percentage was reduced and the patient slept with the upper body elevated, the findings likely underestimate the severity of the sleep apnea.  There was a significantly elevated periodic limb movement index of 31.6 periodic limb movements per hour of sleep. Sometimes these limb movements subside once the apnea is controlled.  There was a reduced sleep efficiency with reduced percentages of REM and slow wave sleep.   These findings would appear to be due to the combination of obstructive sleep apnea and periodic limb movements.   Recommendations:    A CPAP titration would be recommended for the sleep apnea. Some supine sleep should be ensured to optimize  the titration.    Allyne Gee, MD, [facility] Diplomate ABMS-Pulmonary, Critical Care and Sleep Medicine  Electronically reviewed and digitally signed  [place] Report Part II  Phone: [phone] Fax: [phone]  Patient last name Davidson Neck Size 14.5 in. Acquisition [id number]  Patient first name Yvonne Weight 140.0 lbs. Started 09/02/2021 at 10:56:13 PM  Birth date 1940/08/14 Height 62.0 in. Stopped 09/03/2021 at 5:08:49 AM  Age 68 BMI 25.6 lb/in2 Duration 360.6  Study Type Adult      Rod Holler, RPSGT/Anna [place]  Reviewed by: Richelle Ito. Henke, PhD, ABSM, FAASM Sleep Data: Lights Out: 11:02:37 PM Sleep Onset: 11:12:43 PM  Lights On: 5:03:13 AM Sleep Efficiency: 83.6 %  Total Recording Time: 360.6 min Sleep Latency (from Lights Off) 10.1 min  Total Sleep Time (TST): 301.5 min R Latency (from Sleep Onset): 49.5 min  Sleep Period Time: 350.5 min Total number of awakenings: 9  Wake during sleep: 49.0 min Wake After Sleep Onset (WASO): 49.0 min   Sleep Data:         Arousal Summary: Stage  Latency from lights out (min) Latency from sleep onset (min) Duration (min) % Total Sleep Time  Normal values  N 1 10.1 0.0 32.5 10.8 (5%)  N 2 11.1 1.0 215.5 71.5 (50%)  N 3 29.6 19.5 6.0 2.0 (20%)  R 59.6 49.5 47.5 15.8 (25%)    Number Index  Spontaneous 34 6.8  Apneas & Hypopneas 5 1.0  RERAs 2 0.4       (Apneas & Hypopneas & RERAs)  (7) (1.4)  Limb Movement 9 1.8  Snore 0 0.0  TOTAL 50 10.0     Respiratory Data:  CA OA MA Apnea Hypopnea* A+ H RERA Total  Number 0 0 0 0 '31 31 2 '$ 33  Mean Dur (sec) 0.0 0.0 0.0 0.0 27.2 27.2 25.3 27.1  Max Dur (sec) 0.0 0.0 0.0 0.0 46.5 46.5 27.5 46.5  Total Dur (min) 0.0 0.0 0.0 0.0 14.0 14.0 0.8 14.9  % of TST 0.0 0.0 0.0 0.0 4.7 4.7 0.3 4.9  Index (#/h TST) 0.0 0.0 0.0 0.0 6.2 6.2 0.4 6.6  *Hypopneas scored based on 4% or greater desaturation.  Sleep Stage:        REM NREM TST  AHI 27.8 2.1 6.2  RDI 27.8  2.6 6.6           Body Position Data:  Sleep (min) TST (%) REM (min) NREM (min) CA (#) OA (#) MA (#) HYP (#) AHI (#/h) RERA (#) RDI (#/h) Desat (#)  Supine 301.5 100.00 47.5 254.0 0 0 0 31 6.2 2 6.6 44  Non-Supine 0.00 0.00 0.00 0.00 0.00 0.00 0.00 0.00 0.00 0 0.00 0.00     Snoring: Total number of snoring episodes  0  Total time with snoring    min (   % of sleep)   Oximetry Distribution:             WK REM NREM TOTAL  Average (%)   93 94 94 94  < 90% 2.0 0.7 0.3 3.0  < 80% 1.8 0.0 0.0  1.8  < 70% 1.8 0.0 0.0 1.8  # of Desaturations* '1 22 21 '$ 44  Desat Index (#/hour) 1.3 27.8 5.0 8.8  Desat Max (%) '4 7 7 7  '$ Desat Max Dur (sec) 7.0 53.0 118.0 118.0  Approx Min O2 during sleep 87  Approx min O2 during a respiratory event 87  Was Oxygen added (Y/N) and final rate No:   0 LPM  *Desaturations based on 3% or greater drop from baseline.   Cheyne Stokes Breathing: None Present   Heart Rate Summary:  Average Heart Rate During Sleep 43.3 bpm      Highest Heart Rate During Sleep (95th %) 47.0 bpm      Highest Heart Rate During Sleep 98 bpm      Highest Heart Rate During Recording (TIB) 203 bpm (artifact)   Heart Rate Observations: Event Type # Events   Bradycardia 0 Lowest HR Scored: N/A  Sinus Tachycardia During Sleep 0 Highest HR Scored: N/A  Narrow Complex Tachycardia 0 Highest HR Scored: N/A  Wide Complex Tachycardia 0 Highest HR Scored: N/A  Asystole 0 Longest Pause: N/A  Atrial Fibrillation 0 Duration Longest Event: N/A  Other Arrythmias  No Type:    Periodic Limb Movement Data: (Primary legs unless otherwise noted) Total # Limb Movement 160 Limb Movement Index 31.8  Total # PLMS 159 PLMS Index 31.6  Total # PLMS Arousals 9 PLMS Arousal Index 1.8  Percentage Sleep Time with PLMS 80.38mn (26.8 % sleep)  Mean Duration limb movements (secs) 440.2

## 2021-09-08 DIAGNOSIS — G4733 Obstructive sleep apnea (adult) (pediatric): Secondary | ICD-10-CM | POA: Diagnosis not present

## 2021-09-15 DIAGNOSIS — G4733 Obstructive sleep apnea (adult) (pediatric): Secondary | ICD-10-CM | POA: Diagnosis not present

## 2021-09-17 ENCOUNTER — Ambulatory Visit: Payer: PPO | Admitting: Internal Medicine

## 2021-09-17 DIAGNOSIS — G4733 Obstructive sleep apnea (adult) (pediatric): Secondary | ICD-10-CM | POA: Diagnosis not present

## 2021-09-23 ENCOUNTER — Telehealth: Payer: Self-pay | Admitting: Cardiovascular Disease

## 2021-09-23 NOTE — Telephone Encounter (Signed)
I called and spoke with the patient. She called with concerns of HTN at night and advised this is waking her up as she is sensitive to when her BP is elevated.  Reported BP readings are:  Friday 8/25: 10 pm- 132/66 11:30 pm- 140/59  Saturday 8/26 1:30 am- 164/70 Waking up for the day- 156/58 Before bed- 125/66  Monday 8/28: 10:30 pm- 133/59  Tuesday 8/29: 12:30 am- 178/68 (41) 2:30 pm- 171/68  Seen in the ER on 7/4 for HTN, but not seen after 6 hours, so she left. At the time her SBP was 234 per her report.   She also reports some elevated readings from 6/15: 1:00 am- 160/60 6:42 am- 176/70  The patient has known DM, but her sugars are now well controlled.  She had a negative Renal artery angiogram with Dr. Lucky Cowboy in 03/2020.   She does have known sleep apnea and having issues with her mask blowing air out of it. She had a repeat sleep study 2-3 weeks ago and went for her mask fitting last night. However, her BP last night was 195/70, so she was unable to have her fitting done and is waiting to have this re/s. She has been given a Bi-pap to use in the interim, but this gives her headaches and she can't sleep well with this.  Medications are confirmed with the patient: 1) Clonidine 0.1 mg: 1 tablet at 8:30 pm & 1 tablet at 10:30 pm 2) Olmesartan 40  mg: 1 tablet in the AM 3) Bystolic 10 mg: 1 tablet at 6:00 pm  - she does report intermittently taking extra olmesartan or clonidine at times.   I have advised the patient I will need to send to Dr. Rockey Situ for further review and advisement as to how he recommends treating her BP at this time to ensure she can be refitted for her C-PAP. The patient is also advised that her inappropriately treated sleep apnea is most likely driving her HTN/ fatigue at this time.  She is aware we will call her back with further MD recommendations once available.  The patient voices understanding and is agreeable.

## 2021-09-23 NOTE — Telephone Encounter (Signed)
Pt c/o BP issue: STAT if pt c/o blurred vision, one-sided weakness or slurred speech  1. What are your last 5 BP readings? Systolic ranging in the 320'E Diastolic 33'I-35'W  2. Are you having any other symptoms (ex. Dizziness, headache, blurred vision, passed out)? Headache and extreme fatigue   3. What is your BP issue? Believes she has nocturnal hypertension. Was unable to do sleep study last night due to her BP being too high and not being able to sleep. Patient was upstairs in her room and did not have BP readings at the time of the call, but states she will have them when she is called back by the nurse.

## 2021-09-24 NOTE — Telephone Encounter (Signed)
Spoke with patient and she stated that she is taking 3 doses of clonidine at night, 1 tablet  at 8:30 pm, 10:30, and 12:30 AM.  She is taking one tablet of Olmesartan 40 MG every AM.  I informed patient that we sent a message to Dr. Rockey Situ and that I will send this updated information to him as well. I informed her that we would call her back as soon as we receive instructions back from him.

## 2021-09-24 NOTE — Telephone Encounter (Signed)
Pt c/o BP issue: STAT if pt c/o blurred vision, one-sided weakness or slurred speech  1. What are your last 5 BP readings?   176/75 160/60 165/62 161/64 189/76  2. Are you having any other symptoms (ex. Dizziness, headache, blurred vision, passed out)?  Headache  3. What is your BP issue?    Patient called back concerned about her high blood pressure and would like a call back to discuss options.

## 2021-09-25 NOTE — Telephone Encounter (Signed)
Left message for pt to call back. Advised her that I am working remotely and calling from a blocked #.

## 2021-09-25 NOTE — Telephone Encounter (Signed)
Left message for pt to call back.   [name], MD  [facility] Triage Caller: Unspecified (2 days ago,  8:10 AM) Blood pressure looks up and down to some degree  Several days the blood pressure looked quite okay  Wonder if there is any stress pushing the numbers up at nighttime  Sleep deprivation can cause labile numbers  2  clonidine in the evening is okay, typically I will balance additional clonidine 12 hours later so it does not run out of her system and cause labile hypertension  Numerous allergies so I do not have a emergency pill for blood pressure  Potentially could try a medication such as amlodipine 5 up to 10 mg at dinnertime for systolic pressure over 890  Thx  TG

## 2021-09-25 NOTE — Telephone Encounter (Signed)
Pt returning a call.

## 2021-09-28 ENCOUNTER — Other Ambulatory Visit: Payer: Self-pay | Admitting: Cardiovascular Disease

## 2021-09-29 ENCOUNTER — Encounter (INDEPENDENT_AMBULATORY_CARE_PROVIDER_SITE_OTHER): Payer: HMO | Admitting: Internal Medicine

## 2021-09-29 DIAGNOSIS — G4733 Obstructive sleep apnea (adult) (pediatric): Secondary | ICD-10-CM | POA: Diagnosis not present

## 2021-09-30 ENCOUNTER — Encounter: Payer: Self-pay | Admitting: Nurse Practitioner

## 2021-09-30 ENCOUNTER — Ambulatory Visit: Payer: HMO | Attending: Nurse Practitioner | Admitting: Nurse Practitioner

## 2021-09-30 VITALS — BP 140/40 | HR 54 | Ht 62.0 in | Wt 145.0 lb

## 2021-09-30 DIAGNOSIS — I6523 Occlusion and stenosis of bilateral carotid arteries: Secondary | ICD-10-CM | POA: Diagnosis not present

## 2021-09-30 DIAGNOSIS — I10 Essential (primary) hypertension: Secondary | ICD-10-CM

## 2021-09-30 DIAGNOSIS — E782 Mixed hyperlipidemia: Secondary | ICD-10-CM

## 2021-09-30 MED ORDER — OLMESARTAN MEDOXOMIL 20 MG PO TABS: ORAL_TABLET | ORAL | 6 refills | Status: DC

## 2021-09-30 NOTE — Telephone Encounter (Signed)
Left message for pt to call back  °

## 2021-09-30 NOTE — Telephone Encounter (Signed)
Pt calling back still complaining of high BP at night. She made an appt today with Sharolyn Douglas, NP at 3:35pm

## 2021-09-30 NOTE — Progress Notes (Signed)
Office Visit    Patient Name: TIMAYA BOJARSKI Date of Encounter: 09/30/2021  Primary Care Provider:  Dion Body, MD Primary Cardiologist:  Ida Rogue, MD  Chief Complaint    81 year old female with history significant for carotid artery disease, DM2, HTN with mild renal artery stenosis, SDH, HLD, OSA, chronic pain in her legs, GERD, anxiety, depression, and adjustment disorder here for a follow-up visit related to hypertension.   Past Medical History    Past Medical History:  Diagnosis Date   Allergy    Anemia    Anxiety    Arthritis    Bilateral cataracts    immature   Carotid arterial disease ([place])    a. RICA 28-76%, LICA 81-15%.   Chronic sinusitis    takes Cetirizine daily   Depression    doesn't take any meds   Diabetes mellitus without complication ([place])    borderline   Diastolic dysfunction    a. 05/2011: EF 60%, trace AI, trace MR, mild TR; b. 10/2016 Echo: EF 60-65%, no rmwa, GrI DD, triv AI, mild MR, mildly dil LA.   Diverticulosis    Dry eyes    GERD (gastroesophageal reflux disease)    TAKES OMEPRAZOLE DAILY   Hashimoto's thyroiditis    History of blood transfusion    in the 60's no abnormal reaction noted   Hyperlipidemia    a. self discontinued statin   Hypertension    Insomnia    takes Trazodone nightly   Medication intolerance    OSA (obstructive sleep apnea)    doesn't use a cpap   Osteoporosis    takes Vit D as needed   PAD (peripheral artery disease) ([place])    a. 03/2020 Angio: Relatively nl Ao and iliac arteries. <25% bilat RAS. SMA 30%->Med rx.   PONV (postoperative nausea and vomiting)    hard to wake up   Renal artery stenosis ([place])    a. 03/2020 Renal angio: <25% bilat RAS.   Past Surgical History:  Procedure Laterality Date   CHOLECYSTECTOMY  12/07/2012   Dr Lucia Gaskins   CHOLECYSTECTOMY N/A 12/07/2012   Procedure: LAPAROSCOPIC CHOLECYSTECTOMY WITH INTRAOPERATIVE CHOLANGIOGRAM;  Surgeon: Shann Medal, MD;  Location: [facility];  Service: General;  Laterality: N/A;   LIPOSUCTION     NASAL SINUS SURGERY  2006   x 2   RENAL ANGIOGRAPHY Right 04/14/2020   Procedure: RENAL ANGIOGRAPHY;  Surgeon: Algernon Huxley, MD;  Location: [facility];  Service: Cardiovascular;  Laterality: Right;   right arm surgery  1960   TCS      Allergies  Allergies  Allergen Reactions   Buprenorphine Hcl Anaphylaxis    "Cant breath when I take it"   Chlorphen-Diphenhyd-Pe-Apap     Other reaction(s): Other (See Comments) Shortness of breath   Codeine Shortness Of Breath   Mold Extract [Trichophyton Mentagrophyte] Shortness Of Breath   Morphine And Related Anaphylaxis    "Cant breath when I take it"   Oxycodone Shortness Of Breath   Oxycodone Hcl Anaphylaxis    ALL NARCOTICS   Oxycodone Hcl Anaphylaxis    ALL NARCOTICS   Sumycin [Tetracycline Hcl] Anaphylaxis   Trichophyton Shortness Of Breath   Brompheniramine-Pseudoeph Other (See Comments)    Other reaction(s): SHORTNESS OF BREATH   Cardura [Doxazosin Mesylate]     Palpitations    Chlorphen-Phenyleph-Asa     Other reaction(s): Other (See Comments) Shortness of breath   Decongest-Aid [Pseudoephedrine]    Esomeprazole Magnesium Other (  See Comments)    Stomach issues   Esomeprazole Magnesium     Other reaction(s): Other (See Comments) Stomach issues   Hydralazine     Palpitations    Hydrochlorothiazide Other (See Comments)    pancreatitis   Indomethacin Other (See Comments)    disorientation   Neomycin-Bacitracin-Polymyxin [Bacitracin-Neomycin-Polymyxin] Other (See Comments)    Other reaction(s): RASH   Prednisone Hypertension   Procaine Hcl Other (See Comments)    TACHYCARDIA    Keflex [Cephalexin] Rash    Unknown, allergic to a lot of antibiotics   Latex Rash    Breaks out Breaks out   Neomycin-Bacitracin Zn-Polymyx Rash   Pseudoephedrine Hcl Er Palpitations   Sudafed Pe Cold & Cough Child  [Phenylephrine-Dm] Palpitations   Telithromycin Rash    Tetracycline Rash    History of Present Illness    81 year old female with history significant for carotid artery disease, DM2, HTN with mild renal artery stenosis, SDH, HLD, OSA, chronic pain in her legs, GERD, anxiety, depression, and adjustment disorder.  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 She then underwent renal angiogram 04/14/20 that showed relatively normal aorta and iliac arteries.  Both renal arteries had calcific plaque with minimal stenosis of less than 25%. The SMA had some calcific plaque and stenosis in the 30% range but nothing significant.  Carotid artery ultrasound in 04/2019 showed 40-59% RICA stenosis, 01-75% LICA stenosis, antegrade flow of the bilateral vertebral arteries and normal flow hemodynamics of the bilateral subclavian arteries.  Patient has a long history of uncontrolled OSA and is being followed by Dr. Humphrey Rolls.  Patient states that she has had numerous sleep studies but they have not found a mask that will fit her face properly.  She had a sleep study last night but was unable to complete the study because she felt that her blood pressure was too high so she went home.   She presents today stating that her blood pressure, especially at night, is not being controlled on current medications.  She currently takes olmesartan 34m, nebivolol 132m and clonidine 0.88m188mID (Rx BID, but freq taking an extra dose).  During the last two weeks, she states that her morning blood pressures are usually between 100-120 SBP but her night blood pressures range from 140-210 SBP.  She states that she is waking herself up in the middle of the night to check her blood pressure so that she does not have a stroke.  During times of high blood pressure, she admits to having a headache.    She denies chest pain, palpitations, dyspnea, pnd, orthopnea, n, v, dizziness, syncope, edema, weight gain, or early satiety.   Home Medications    Current Outpatient Medications  Medication Sig Dispense Refill   B-D ULTRA-FINE 33 LANCETS MISC Use as directed. Dx 250.0     Blood Glucose Monitoring Suppl (ONE TOUCH ULTRA MINI) W/DEVICE KIT USE AS DIRECTED 1 each 0   cloNIDine (CATAPRES) 0.1 MG tablet TAKE ONE TABLET BY MOUTH TWICE DAILY (Patient taking differently: Take 0.1 mg by mouth 3 (three) times daily.) 180 tablet 3   ezetimibe (ZETIA) 10 MG tablet Take 1 tablet (10 mg total) by mouth daily. 90 tablet 3   metFORMIN (GLUCOPHAGE) 500 MG tablet Take 250 mg  by mouth 2 (two) times daily with a meal.     nebivolol (BYSTOLIC) 10 MG tablet Take 1 tablet (10 mg total) by mouth daily. 90 tablet 3   olmesartan (BENICAR) 20 MG tablet Take 1 tablet (20 mg) by mouth TWICE daily 60 tablet 6   ONETOUCH DELICA LANCETS 09T MISC Use as directed. Dx 250.0 100 each 3   pantoprazole (PROTONIX) 20 MG tablet Take 20 mg by mouth daily.     No current facility-administered medications for this visit.     Review of Systems    General: no chills, fever, changes in weight, night sweats Cardiovascular: no chest pain, palpitations, edema, dyspnea on exertion, orthopnea Respiratory: no shortness of breath, cough Abdomen: no nausea, vomiting, abdominal pain Neurological: no dizziness, fainting, tingling, weakness, sensory changes All other systems reviewed and are otherwise negative except as noted above.    Physical Exam    VS:  BP (!) 140/40 (BP Location: Left Arm, Patient Position: Sitting, Cuff Size: Normal)   Pulse (!) 54   Ht '5\' 2"'  (1.575 m)   Wt 145 lb (65.8 kg)   SpO2 97%   BMI 26.52 kg/m  , BMI Body mass index is 26.52 kg/m.     GEN: Well nourished, well developed, in no acute distress. HEENT: normal. Neck: Supple, no JVD, carotid bruits, or masses. Cardiac: RRR, no murmurs, rubs, or gallops.  No clubbing, cyanosis, edema.  Radials/PT 2+ and equal bilaterally.  Respiratory:  Respirations regular and unlabored, clear to auscultation bilaterally. GI: Soft, nontender, nondistended, BS + x 4. MS: no deformity or atrophy. Skin: warm and dry, no rash. Neuro:  Strength and sensation are intact. Psych: Normal affect.  Accessory Clinical Findings     Lab Results  Component Value Date   WBC 8.4 07/29/2021   HGB 12.3 07/29/2021   HCT 39.2 07/29/2021   MCV 93.6 07/29/2021   PLT 262 07/29/2021   Lab Results  Component Value Date   CREATININE 1.02 (H) 07/29/2021   BUN 22 07/29/2021   NA 140 07/29/2021   K 4.0 07/29/2021   CL 107 07/29/2021   CO2 26 07/29/2021   Lab Results  Component Value Date   ALT 17 07/29/2021   AST 17 07/29/2021   ALKPHOS 56 07/29/2021   BILITOT 0.5 07/29/2021   Lab Results  Component Value Date   CHOL 126 03/03/2015   HDL 47 03/03/2015   LDLCALC 67 03/03/2015   LDLDIRECT 156.6 05/17/2012   TRIG 62 03/03/2015   CHOLHDL 2.7 03/03/2015    Lab Results  Component Value Date   HGBA1C 6.6 (H) 02/13/2015    Assessment & Plan    1.  Essential Hypertension:  Long history of somewhat difficult to manage hypertension with multiple drug intolerances.  During the last two weeks, she states that her morning blood pressures are usually between 100-120 SBP but her night blood pressures range from 140-210 SBP.  She states that she is waking herself up in the middle of the night to check her blood pressure so that she does not have a stroke.  During times of high blood pressure, she admits to having a headache.  Discussed negative effects of waking herself up to check her blood pressure at length.  Considering patient's multiple allergies/intolerances and relative chronic low heart rate, we will continue clonidine 0.62m TID and nebivolol 116mdaily.  We will change olmesartan from 4012maily to 52m44mD to see if this will provide improved control in the  PM.  If  there is no significant change by adjusting the timing of losartan, would have a low threshold to change to valsartan.  Discussed w/ pt today.  2. Carotid artery disease: Carotid artery ultrasound in 04/2019 showed 40-59% RICA stenosis, 73-66% LICA stenosis, antegrade flow of the bilateral vertebral arteries and normal flow hemodynamics of the bilateral subclavian arteries.  We will order a repeat carotid artery ultrasound.  Continue Zetia and asa. Had some intolerances to statins in the past.   3. Obstructive sleep apnea: Continue following with Dr. Humphrey Rolls related to CPAP titration.  Awaiting repeat sleep study, ideally once nocturnal bp's are better.  4. Hyperlipidemia: LDL 108.  Continue Zetia. Had some intolerance to statins in the past.   5. Nonobstructive Renal artery stenosis:  mild, nonobs RAS on angio in 2022.  Cont zetia/asa.  6.  Disposition: follow-up in one month or sooner if necessary.    Murray Hodgkins, NP 09/30/2021, 5:00 PM

## 2021-09-30 NOTE — Patient Instructions (Addendum)
Medication Instructions:  - Your physician has recommended you make the following change in your medication:   1) CHANGE Benicar to 20 mg: - take 1 tablet by mouth TWICE daily (or every 12 hours)    *If you need a refill on your cardiac medications before your next appointment, please call your pharmacy*   Lab Work: - none ordered  If you have labs (blood work) drawn today and your tests are completely normal, you will receive your results only by: [place] (if you have MyChart) OR A paper copy in the mail If you have any lab test that is abnormal or we need to change your treatment, we will call you to review the results.   Testing/Procedures: - none ordered   Follow-Up: At [facility], you and your health needs are our priority.  As part of our continuing mission to provide you with exceptional heart care, we have created designated Provider Care Teams.  These Care Teams include your primary Cardiologist (physician) and Advanced Practice Providers (APPs -  Physician Assistants and Nurse Practitioners) who all work together to provide you with the care you need, when you need it.  We recommend signing up for the patient portal called "MyChart".  Sign up information is provided on this After Visit Summary.  MyChart is used to connect with patients for Virtual Visits (Telemedicine).  Patients are able to view lab/test results, encounter notes, upcoming appointments, etc.  Non-urgent messages can be sent to your provider as well.   To learn more about what you can do with MyChart, go to [email or web address].    Your next appointment:   1 month(s)  The format for your next appointment:   In Person  Provider:   You may see Ida Rogue, MD or one of the following Advanced Practice Providers on your designated Care Team:   Murray Hodgkins, NP     Other Instructions N/a  Important Information About Sugar

## 2021-10-01 ENCOUNTER — Ambulatory Visit: Payer: HMO | Admitting: Internal Medicine

## 2021-10-02 NOTE — Procedures (Signed)
[facility] Report Part I  Phone: [phone] Fax: [phone]  Patient Name: Yvonne Davidson, Yvonne Davidson Acquisition Number: [id number]  Date of Birth: 09-05-1940 Acquisition Date: 09/29/2021  Referring Physician: Allyne Gee, MD     History: The patient is a 81 year old female with obstructive sleep apnea for CPAP titration. Medical History: anemia, anxiety, arthritis, bilateral cartaracts, chronic sinusitis, depression, diabetes, diverticulosis, dry eyes, GERD, Hashimoto's thyroiditis, hyperlipidemia, hypertension, insomnia, OSA, osteoporosis and PONV.  Medications: clonidine, ezetimibe, metformin, nebivolol, olmesartan and pantoprazole.  Procedure: This routine overnight polysomnogram was performed on the [facility] 5 using the standard CPAP protocol. This included 6 channels of EEG, 2 channels of EOG, chin EMG, bilateral anterior tibialis EMG, nasal/oral thermistor, PTAF (nasal pressure transducer), chest and abdominal wall movements, EKG, and pulse oximetry.  Description: The total recording time was 108.5 minutes. The total sleep time was 66.0 minutes. There were a total of 21.7 minutes of wakefulness after sleep onset for a poorsleep efficiency of 60.8%. The latency to sleep onset was within normal limits at 20.8 minutes. The R sleep onset latency was N/A.  Sleep parameters, as a percentage of the total sleep time, demonstrated 12.9% of sleep was in N1 sleep, 65.2% N2, 22.0% N3 and 0.0% R sleep. There were a total of 17 arousals for an arousal index of 15.5 arousals per hour of sleep that was slightly elevated.  The apnea was well controlled on CPAP with no respiratory events observed. CPAP was initiated at 4 cm H2O at lights out, 11:12 p.m. and remained at this pressure.  Additionally, the baseline oxygen saturation during wakefulness was 79%, during NREM sleep averaged 95%, and during REM sleep averaged N/A.The total duration of oxygen < 90% was 7.1 minutes and  <80% was 6.6 minutes.  Cardiac monitoring- There were no significant cardiac rhythm irregularities.   Periodic limb movement monitoring- did not demonstrate periodic limb movements.   Impression: This patient's obstructive sleep apnea demonstrated significant improvement with the utilization of nasal CPAP at 4 cm H2O, however sleep time was limited at 66 minutes. The patient woke around 1 a.m. to use the restroom and took her blood pressure. It was very elevated and she requested to terminate the study.      Recommendations: Would recommend a repeat titration.      A small ResMed Quattro Full mask was used. Chin strap used during study- no. Humidifier used during study- yes.     Allyne Gee, MD, [facility] Diplomate ABMS-Pulmonary, Critical Care and Sleep Medicine  Electronically reviewed and digitally signed   [place] CPAP/BIPAP Polysomnogram Report Part II Phone: [phone] Fax: [phone]  Patient last name Davidson Neck Size 14.5 in. Acquisition [phone]  Patient first name Yvonne Weight 140.0 lbs. Started 09/29/2021 at 11:06:03 PM  Birth date September 21, 1940 Height 62.0 in. Stopped 09/30/2021 at 1:48:45 AM  Age 81      Type Adult BMI 25.6 lb/in2 Duration 108.5  Report generated by Juleen Starr, RPSGT  Reviewed by: Richelle Ito. Henke, PhD, ABSM, FAASM Sleep Data: Lights Out: 11:12:15 PM Sleep Onset: 11:33:03 PM  Lights On: 1:00:45 AM Sleep Efficiency: 60.8 %  Total Recording Time: 108.5 min Sleep Latency (from Lights Off) 20.8 min  Total Sleep Time (TST): 66.0 min R Latency (from Sleep Onset): N/A  Sleep Period Time: 70.0 min Total number of awakenings: 5  Wake during sleep: 4.0 min Wake After Sleep Onset (WASO): 21.7 min   Sleep  Data:         Arousal Summary: Stage  Latency from lights out (min) Latency from sleep onset (min) Duration (min) % Total Sleep Time  Normal values  N 1 20.8 0.0 8.5 12.9 (5%)  N 2 21.8 1.0 43.0 65.2 (50%)  N 3 31.8 11.0 14.5 22.0  (20%)  R N/A N/A 0.0 0.0 (25%)    Number Index  Spontaneous 17 15.5  Apneas & Hypopneas 0 0.0  RERAs 0 0.0       (Apneas & Hypopneas & RERAs)  (0) (0.0)  Limb Movement 0 0.0  Snore 0 0.0  TOTAL 17 15.5     Respiratory Data:  CA OA MA Apnea Hypopnea* A+ H RERA Total  Number 0 0 0 0 0 0 0 0  Mean Dur (sec) 0.0 0.0 0.0 0.0 0.0 0.0 0.0 0.0  Max Dur (sec) 0.0 0.0 0.0 0.0 0.0 0.0 0.0 0.0  Total Dur (min) 0.0 0.0 0.0 0.0 0.0 0.0 0.0 0.0  % of TST 0.0 0.0 0.0 0.0 0.0 0.0 0.0 0.0  Index (#/h TST) 0.0 0.0 0.0 0.0 0.0 0.0 0.0 0.0  *Hypopneas scored based on 4% or greater desaturation.  Sleep Stage:         REM NREM TST  AHI N/A 0.0 0.0  RDI N/A 0.0 0.0    Sleep (min) TST (%) REM (min) NREM (min) CA (#) OA (#) MA (#) HYP (#) AHI (#/h) RERA (#) RDI (#/h) Desat (#)  Supine 66.0 100.00 0.0 66.0 0 0 0 0 0.0 0 0.00 3  Non-Supine 0.00 0.00 0.00 0.00 0.00 0.00 0.00 0.00 0.00 0 0.00 0.00     Snoring: Total number of snoring episodes  0  Total time with snoring    min (   % of sleep)   Oximetry Distribution:             WK REM NREM TOTAL  Average (%)   79    95 90  < 90% 7.1 0.0 0.0 7.1  < 80% 6.6 0.0 0.0 6.6  < 70% 6.4 0.0 0.0 6.4  # of Desaturations* 0 0 2 2  Desat Index (#/hour) 0.0    1.8 1.8  Desat Max (%) 0 0 4 4  Desat Max Dur (sec) 0.0 0.0 113.0 113.0  Approx Min O2 during sleep 94  Approx min O2 during a respiratory event     Was Oxygen added (Y/N) and final rate No:   0 LPM  *Desaturations based on 4% or greater drop from baseline.   Cheyne Stokes Breathing: None Present    Heart Rate Summary:  Average Heart Rate During Sleep 41.1 bpm      Highest Heart Rate During Sleep (95th %) 43.0 bpm      Highest Heart Rate During Sleep 55 bpm      Highest Heart Rate During Recording (TIB) 246 bpm (artifact)   Heart Rate Observations: Event Type # Events   Bradycardia 0 Lowest HR Scored: N/A  Sinus Tachycardia During Sleep 0 Highest HR Scored: N/A  Narrow  Complex Tachycardia 0 Highest HR Scored: N/A  Wide Complex Tachycardia 0 Highest HR Scored: N/A  Asystole 0 Longest Pause: N/A  Atrial Fibrillation 0 Duration Longest Event: N/A  Other Arrythmias  No Type:   Periodic Limb Movement Data: (Primary legs unless otherwise noted) Total # Limb Movement 0 Limb Movement Index 0.0  Total # PLMS    PLMS Index     Total #  PLMS Arousals    PLMS Arousal Index     Percentage Sleep Time with PLMS   min (   % sleep)  Mean Duration limb movements (secs)       IPAP Level (cmH2O) EPAP Level (cmH2O) Total Duration (min) Sleep Duration (min) Sleep (%) REM (%) CA  #) OA # MA # HYP #) AHI (#/hr) RERAs # RERAs (#/hr) RDI (#/hr)  4 4 70.0 66.0 94.3 0.0 0 0 0 0 0.0 0 0.0 0.0

## 2021-10-15 ENCOUNTER — Ambulatory Visit: Payer: HMO | Admitting: Internal Medicine

## 2021-10-15 ENCOUNTER — Encounter: Payer: Self-pay | Admitting: Internal Medicine

## 2021-10-15 VITALS — BP 136/80 | HR 53 | Temp 98.2°F | Resp 16 | Ht 62.0 in | Wt 144.4 lb

## 2021-10-15 DIAGNOSIS — G4733 Obstructive sleep apnea (adult) (pediatric): Secondary | ICD-10-CM

## 2021-10-15 DIAGNOSIS — Z7189 Other specified counseling: Secondary | ICD-10-CM | POA: Diagnosis not present

## 2021-10-15 DIAGNOSIS — I10 Essential (primary) hypertension: Secondary | ICD-10-CM | POA: Diagnosis not present
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
## 2021-10-16 DIAGNOSIS — G4733 Obstructive sleep apnea (adult) (pediatric): Secondary | ICD-10-CM | POA: Diagnosis not present

## 2021-10-17 DIAGNOSIS — G4733 Obstructive sleep apnea (adult) (pediatric): Secondary | ICD-10-CM | POA: Diagnosis not present

## 2021-10-20 ENCOUNTER — Telehealth: Payer: Self-pay | Admitting: Internal Medicine

## 2021-10-20 NOTE — Telephone Encounter (Signed)
Patient called stating she is on way to [facility] in [place]. Needs someone from office to give them permission to lower pressure on machine. Sent message to [place] and cmas-Toni

## 2021-10-30 ENCOUNTER — Ambulatory Visit: Payer: HMO | Admitting: Nurse Practitioner

## 2021-11-11 ENCOUNTER — Ambulatory Visit (INDEPENDENT_AMBULATORY_CARE_PROVIDER_SITE_OTHER): Payer: HMO

## 2021-11-11 DIAGNOSIS — G4733 Obstructive sleep apnea (adult) (pediatric): Secondary | ICD-10-CM

## 2021-11-11 NOTE — Progress Notes (Unsigned)
She is having a very hard time with bipap spoke to [place] agreed to try the bipap at an auto of 5-15 to start also spoke to pt about different mask she stated one she has was horrible. Pt asked if I could refit her I will arrange to have a fitting when we get approval thru insurance. Pt was seen by Claiborne Billings  RRT/RCP  from [facility]

## 2021-11-12 NOTE — Progress Notes (Signed)
Date:  11/13/2021   ID:  [name], DOB April 16, 1940, MRN [id number]  Patient Location:  [place] [id number]   Provider location:   [facility], [place] office  PCP:  Dion Body, MD  Cardiologist:  [facility]   Chief Complaint  Patient presents with   1 month follow up     Patient c/o elevated blood pressure in the evenings. Medications reviewed by the patient verbally.    History of Present Illness:    Yvonne Davidson is a 81 y.o. 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 on the left, 40-59% disease on the right,  Hyperlipidemia DM  hypertension,  obstructive sleep apnea who is followed at [facility],  chronic pain in her legs,  GERD,  Anxiety  She presents for follow-up of her blood pressure and carotid disease  Last seen by myself in clinic November 2022 Seen by one of our providers September 2023 On that visit nighttime blood pressures were elevated, Benicar changed to 20 twice daily  Got covid July 2022 "I have sleep apnea", trouble with mask, poor sleep BP elevated at night "I wake up 100 times at night"  Current medication list Clonidine 0.1 mg 8:30 pm  and 10 PM Bystolic 10 mg at dinner Olmesrtan 20 mg BID Extra clonidine at times, does not take clonidine in the morning  Wakes at 1 Am checks her blood pressure, up to 983 systolic at times  Lab work reviewed A1C 7.2 Total chol 177, LDL 108  No EKG today  Other past medical history reviewed Followed by Dr. Lucky Cowboy for renal artery stenosis Underwent renal angiogram April 14, 2020 : Aorta and iliac arteries were relatively normal.  Both renal arteries had calcific plaque with minimal stenosis of less than 25%. The SMA had some calcific plaque and stenosis in the 30% range but nothing significant.  Carotid artery ultrasound from 04/2019 demonstrated 40 to 59% RICA stenosis and 60 to 38% LICA stenosis with  slight progression of disease noted along the right side and stable left-sided disease  Has OSA, wears "harness",  chin strap, mouthguard Wears it very tight  Intolerances:  off amlodipine secondary to leg swelling  Unable to tolerate any diuretics per the patient including HCTZ, Lasix, Aldactone She is cautious about taking extra doses of Bystolic or clonidine concerned about bradycardia but has never had symptoms   She stopped Crestor on her own Previously reported having mold in her attic in the past, felt it was affecting her breathing Had testing done on her house   For obstructive sleep apnea, she wears a mouth piece designed by [facility] dentistry .  she has chronic aching in her legs. She attributes this to sleep apnea and poor blood supply to her legs   She  retired at the age of 57. She was working in [employer]. She was unable to maintain the pace with such poor sleep on a chronic basis   Past Medical History:  Diagnosis Date   Allergy    Anemia    Anxiety    Arthritis    Bilateral cataracts    immature   Carotid arterial disease ([place])    a. RICA 25-05%, LICA 39-76%.   Chronic sinusitis    takes Cetirizine daily   Depression    doesn't take any meds   Diabetes mellitus without complication ([place])    borderline  Diastolic dysfunction    a. 05/2011: EF 60%, trace AI, trace MR, mild TR; b. 10/2016 Echo: EF 60-65%, no rmwa, GrI DD, triv AI, mild MR, mildly dil LA.   Diverticulosis    Dry eyes    GERD (gastroesophageal reflux disease)    TAKES OMEPRAZOLE DAILY   Hashimoto's thyroiditis    History of blood transfusion    in the 60's no abnormal reaction noted   Hyperlipidemia    a. self discontinued statin   Hypertension    Insomnia    takes Trazodone nightly   Medication intolerance    OSA (obstructive sleep apnea)    doesn't use a cpap   Osteoporosis    takes Vit D as needed   PAD (peripheral artery disease) ([place])    a. 03/2020 Angio: Relatively nl Ao  and iliac arteries. <25% bilat RAS. SMA 30%->Med rx.   PONV (postoperative nausea and vomiting)    hard to wake up   Renal artery stenosis ([place])    a. 03/2020 Renal angio: <25% bilat RAS.   Past Surgical History:  Procedure Laterality Date   CHOLECYSTECTOMY  12/07/2012   Dr Lucia Gaskins   CHOLECYSTECTOMY N/A 12/07/2012   Procedure: LAPAROSCOPIC CHOLECYSTECTOMY WITH INTRAOPERATIVE CHOLANGIOGRAM;  Surgeon: Shann Medal, MD;  Location: [place];  Service: General;  Laterality: N/A;   LIPOSUCTION     NASAL SINUS SURGERY  2006   x 2   RENAL ANGIOGRAPHY Right 04/14/2020   Procedure: RENAL ANGIOGRAPHY;  Surgeon: Algernon Huxley, MD;  Location: [facility];  Service: Cardiovascular;  Laterality: Right;   right arm surgery  1960   TCS       Current Meds  Medication Sig   B-D ULTRA-FINE 33 LANCETS MISC Use as directed. Dx 250.0   Blood Glucose Monitoring Suppl (ONE TOUCH ULTRA MINI) W/DEVICE KIT USE AS DIRECTED   cloNIDine (CATAPRES) 0.1 MG tablet TAKE ONE TABLET BY MOUTH TWICE DAILY (Patient taking differently: Take 0.1 mg by mouth 3 (three) times daily.)   ezetimibe (ZETIA) 10 MG tablet Take 1 tablet (10 mg total) by mouth daily.   metFORMIN (GLUCOPHAGE) 500 MG tablet Take 250 mg by mouth 2 (two) times daily with a meal.   nebivolol (BYSTOLIC) 10 MG tablet Take 1 tablet (10 mg total) by mouth daily.   olmesartan (BENICAR) 20 MG tablet Take 1 tablet (20 mg) by mouth TWICE daily   ONETOUCH DELICA LANCETS 16X MISC Use as directed. Dx 250.0   pantoprazole (PROTONIX) 20 MG tablet Take 20 mg by mouth daily.     Allergies:   Buprenorphine hcl, Chlorphen-diphenhyd-pe-apap, Codeine, Mold extract [trichophyton mentagrophyte], Morphine and related, Oxycodone, Oxycodone hcl, Oxycodone hcl, Sumycin [tetracycline hcl], Trichophyton, Brompheniramine-pseudoeph, Cardura [doxazosin mesylate], Chlorphen-phenyleph-asa, Decongest-aid [pseudoephedrine], Esomeprazole magnesium, Esomeprazole magnesium, Hydralazine,  Hydrochlorothiazide, Indomethacin, Neomycin-bacitracin-polymyxin [bacitracin-neomycin-polymyxin], Prednisone, Procaine hcl, Keflex [cephalexin], Latex, Neomycin-bacitracin zn-polymyx, Pseudoephedrine hcl er, Sudafed pe cold & cough child  [phenylephrine-dm], Telithromycin, and Tetracycline   Social History   Tobacco Use   Smoking status: Never   Smokeless tobacco: Never   Tobacco comments:    quit in 1978  Vaping Use   Vaping Use: Never used  Substance Use Topics   Alcohol use: No    Alcohol/week: 0.0 standard drinks of alcohol   Drug use: No     Family Hx: The patient's family history includes Alcohol abuse in her father; Alzheimer's disease in her brother; Aneurysm in her mother; Arthritis in her mother; Breast cancer in her mother; Cancer  in her mother; Early death in her father; Heart disease in her mother; Hyperlipidemia in her mother; Hypertension in her mother; Rheum arthritis in her brother; Stroke in her mother.  ROS:   Please see the history of present illness.    Review of Systems  Constitutional: Negative.   HENT: Negative.    Respiratory: Negative.    Cardiovascular: Negative.   Gastrointestinal: Negative.   Musculoskeletal: Negative.   Neurological: Negative.   Psychiatric/Behavioral: Negative.    All other systems reviewed and are negative.    Labs/Other Tests and Data Reviewed:    Recent Labs: 04/29/2021: TSH 5.440 07/29/2021: ALT 17; BUN 22; Creatinine, Ser 1.02; Hemoglobin 12.3; Platelets 262; Potassium 4.0; Sodium 140   Recent Lipid Panel Lab Results  Component Value Date/Time   CHOL 126 03/03/2015 08:39 AM   TRIG 62 03/03/2015 08:39 AM   HDL 47 03/03/2015 08:39 AM   CHOLHDL 2.7 03/03/2015 08:39 AM   CHOLHDL 6 01/15/2014 08:17 AM   LDLCALC 67 03/03/2015 08:39 AM   LDLDIRECT 156.6 05/17/2012 03:44 PM    Wt Readings from Last 3 Encounters:  11/13/21 144 lb 6 oz (65.5 kg)  10/15/21 144 lb 6.4 oz (65.5 kg)  09/30/21 145 lb (65.8 kg)     Exam:     Vital Signs: Vital signs may also be detailed in the HPI BP (!) 148/52 (BP Location: Left Arm, Patient Position: Sitting, Cuff Size: Normal)   Pulse (!) 50   Ht '5\' 2"'  (1.575 m)   Wt 144 lb 6 oz (65.5 kg)   SpO2 98%   BMI 26.41 kg/m   Constitutional:  oriented to person, place, and time. No distress.  HENT:  Head: Grossly normal Eyes:  no discharge. No scleral icterus.  Neck: No JVD, no carotid bruits  Cardiovascular: Regular rate and rhythm, no murmurs appreciated Pulmonary/Chest: Clear to auscultation bilaterally, no wheezes or rails Abdominal: Soft.  no distension.  no tenderness.  Musculoskeletal: Normal range of motion Neurological:  normal muscle tone. Coordination normal. No atrophy Skin: Skin warm and dry Psychiatric: normal affect, pleasant  ASSESSMENT & PLAN:    Bilateral carotid artery stenosis -  Right Carotid: Velocities in the right ICA are consistent with a 40-59%  stenosis.  Left Carotid: Velocities in the left ICA are consistent with a 60-79%  stenosis.  Repeat carotid ultrasound ordered, we have requested that she schedule Previously declined statin She will stay on Zetia 10 daily   Hypertension goal BP (blood pressure) < 140/90 - Recommend she continue her medications, Suggested she try isosorbide dinitrate 20 mg for pressure over 160 in the nighttime Numerous medication intolerances   Hyperlipidemia LDL goal <100 - Plan: EKG 12-Lead Stopped statin on zetia Cholesterol above goal Need to consider Repatha   OSA on CPAP - Plan: EKG 12-Lead Managed by Humphrey Rolls, difficulty tolerating CPAP mask Chronic poor sleep hygiene  Anxiety Recommended walking program   Total encounter time more than 30 minutes  Greater than 50% was spent in counseling and coordination of care with the patient     Signed, Ida Rogue, MD  11/13/2021 11:06 AM    [place] Office [address], [place], [place]  [address]

## 2021-11-13 ENCOUNTER — Ambulatory Visit: Payer: HMO | Attending: Nurse Practitioner | Admitting: Cardiovascular Disease

## 2021-11-13 ENCOUNTER — Encounter: Payer: Self-pay | Admitting: Cardiovascular Disease

## 2021-11-13 VITALS — BP 148/52 | HR 50 | Ht 62.0 in | Wt 144.4 lb

## 2021-11-13 DIAGNOSIS — I701 Atherosclerosis of renal artery: Secondary | ICD-10-CM

## 2021-11-13 DIAGNOSIS — I6523 Occlusion and stenosis of bilateral carotid arteries: Secondary | ICD-10-CM | POA: Diagnosis not present

## 2021-11-13 DIAGNOSIS — I771 Stricture of artery: Secondary | ICD-10-CM

## 2021-11-13 DIAGNOSIS — I10 Essential (primary) hypertension: Secondary | ICD-10-CM | POA: Diagnosis not present

## 2021-11-13 DIAGNOSIS — I739 Peripheral vascular disease, unspecified: Secondary | ICD-10-CM

## 2021-11-13 DIAGNOSIS — E1169 Type 2 diabetes mellitus with other specified complication: Secondary | ICD-10-CM

## 2021-11-13 DIAGNOSIS — E782 Mixed hyperlipidemia: Secondary | ICD-10-CM

## 2021-11-13 DIAGNOSIS — R0602 Shortness of breath: Secondary | ICD-10-CM

## 2021-11-13 DIAGNOSIS — E785 Hyperlipidemia, unspecified: Secondary | ICD-10-CM

## 2021-11-13 DIAGNOSIS — K551 Chronic vascular disorders of intestine: Secondary | ICD-10-CM

## 2021-11-13 DIAGNOSIS — I272 Pulmonary hypertension, unspecified: Secondary | ICD-10-CM

## 2021-11-13 MED ORDER — ISOSORBIDE DINITRATE 20 MG PO TABS: 20.0000 mg | ORAL_TABLET | Freq: Three times a day (TID) | ORAL | 1 refills | Status: DC | PRN

## 2021-11-13 NOTE — Patient Instructions (Addendum)
Medication Instructions:  - Your physician has recommended you make the following change in your medication:   1) START Isosorbide dinitrate 20 mg: - take 1 tablet up to three times a day as needed for pressure >160  If you need a refill on your cardiac medications before your next appointment, please call your pharmacy.    Lab work: No new labs needed   Testing/Procedures: No new testing needed   Follow-Up: At [facility], you and your health needs are our priority.  As part of our continuing mission to provide you with exceptional heart care, we have created designated Provider Care Teams.  These Care Teams include your primary Cardiologist (physician) and Advanced Practice Providers (APPs -  Physician Assistants and Nurse Practitioners) who all work together to provide you with the care you need, when you need it.  You will need a follow up appointment as scheduled  Providers on your designated Care Team:   Murray Hodgkins, NP Christell Faith, PA-C Cadence Kathlen Mody, [place]  COVID-19 Vaccine Information can be found at: [email or web address].[email or web address].uk For questions related to vaccine distribution or appointments, please email [email or web address] or call [phone].    Isosorbide Dinitrate Tablets What is this medication? ISOSORBIDE DINITRATE (eye soe SOR bide dye NYE trate) prevents chest pain (angina). It works by relaxing blood vessels, which decreases the amount of work the heart has to do. It belongs to a group of medications called nitrates. Do not use it to treat sudden chest pain. This medicine may be used for other purposes; ask your health care provider or pharmacist if you have questions. COMMON BRAND NAME(S): Isordil Titradose, Sorbitrate, Wesorbide What should I tell my care team before I take this medication? They need to know if you have any of these conditions: Previous heart attack or heart failure An unusual or  allergic reaction to isosorbide dinitrate, nitrates, other medications, foods, dyes, or preservatives Pregnant or trying to get pregnant Breast-feeding How should I use this medication? Take this medication by mouth with a glass of water. Follow the directions on the prescription label. Take this medication on an empty stomach, at least 30 minutes before or 2 hours after food. Do not take with food. Take your medication at regular intervals. Do not take your medication more often than directed. Do not stop taking this medication suddenly or your symptoms may get worse. Ask your care team how to gradually reduce the dose. Talk to your care team about the use of this medication in children. Special care may be needed. Overdosage: If you think you have taken too much of this medicine contact a poison control center or emergency room at once. NOTE: This medicine is only for you. Do not share this medicine with others. What if I miss a dose? If you miss a dose, take it as soon as you can. If it is almost time for your next dose, take only that dose. Do not take double or extra doses. What may interact with this medication? Do not take this medication with any of the following: Medications used to treat erectile dysfunction (ED) like avanafil, sildenafil, tadalafil, and vardenafil Riociguat This medication may also interact with the following: Medications for high blood pressure Other medications for angina or heart failure This list may not describe all possible interactions. Give your health care provider a list of all the medicines, herbs, non-prescription drugs, or dietary supplements you use. Also tell them if you smoke, drink alcohol, or use illegal drugs. Some items may interact  with your medicine. What should I watch for while using this medication? Check your heart rate and blood pressure regularly while you are taking this medication. Ask your care team what your heart rate and blood pressure  should be and when you should contact him or her. Tell your care team if you feel your medication is no longer working. You may get dizzy. Do not drive, use machinery, or do anything that needs mental alertness until you know how this medication affects you. To reduce the risk of dizzy or fainting spells, do not sit or stand up quickly, especially if you are an older patient. Alcohol can make you more dizzy, and increase flushing and rapid heartbeats. Avoid alcoholic drinks. Do not treat yourself for coughs, colds, or pain while you are taking this medication without asking your care team for advice. Some ingredients may increase your blood pressure. What side effects may I notice from receiving this medication? Side effects that you should report to your care team as soon as possible: Allergic reactions--skin rash, itching, hives, swelling of the face, lips, tongue, or throat Headache, unusual weakness or fatigue, shortness of breath, nausea, vomiting, rapid heartbeat, blue skin or lips, which may be signs of methemoglobinemia Increased pressure around the brain--severe headache, blurry vision, change in vision, nausea, vomiting Low blood pressure--dizziness, feeling faint or lightheaded, blurry vision Slow heartbeat--dizziness, feeling faint or lightheaded, confusion, trouble breathing, unusual weakness or fatigue Worsening chest pain (angina)--pain, pressure, or tightness in the chest, neck, back, or arms Side effects that usually do not require medical attention (report to your care team if they continue or are bothersome): Dizziness Flushing Headache This list may not describe all possible side effects. Call your doctor for medical advice about side effects. You may report side effects to FDA at 1-800-FDA-1088. Where should I keep my medication? Keep out of the reach of children. Store at room temperature, approximately 25 degrees C (77 degrees F). Protect from light. Keep container tightly  closed. Throw away any unused medication after the expiration date. NOTE: This sheet is a summary. It may not cover all possible information. If you have questions about this medicine, talk to your doctor, pharmacist, or health care provider.  2023 Elsevier/Gold Standard (2020-04-28 00:00:00)

## 2021-11-23 ENCOUNTER — Encounter (INDEPENDENT_AMBULATORY_CARE_PROVIDER_SITE_OTHER): Payer: Self-pay

## 2021-11-27 ENCOUNTER — Other Ambulatory Visit: Payer: Self-pay | Admitting: Cardiovascular Disease

## 2021-11-30 ENCOUNTER — Telehealth: Payer: Self-pay | Admitting: Cardiovascular Disease

## 2021-11-30 MED ORDER — CLONIDINE HCL 0.1 MG PO TABS: 0.1000 mg | ORAL_TABLET | Freq: Three times a day (TID) | ORAL | 3 refills | Status: DC

## 2021-11-30 NOTE — Telephone Encounter (Signed)
Pt retuning nurses call. Call transferred

## 2021-11-30 NOTE — Telephone Encounter (Signed)
Patient states she is taking clonidine three times daily but the last AVS from 11/13/21 does not reflect this. Rx sent to pharmacy to be taken twice daily so now she is out of medication. Please advise on directions for clonidine. Thank you!

## 2021-11-30 NOTE — Telephone Encounter (Signed)
Left a message for the patient to call back.  

## 2021-11-30 NOTE — Telephone Encounter (Signed)
Spoke to the patient about her Clonidine. She stated that she has been taking Clonidine 0.1 mg tid and not bid. Per Dr. Rockey Situ, it is okay to refill as three times daily.   Refill has been sent in.

## 2021-11-30 NOTE — Telephone Encounter (Signed)
cloNIDine (CATAPRES) 0.1 MG tablet 180 tablet 3 09/29/2021    Sig - Route: TAKE ONE TABLET BY MOUTH TWICE DAILY - Oral   Patient taking differently: Take 0.1 mg by mouth 3 (three) times daily.       Sent to pharmacy as: cloNIDine (CATAPRES) 0.1 MG tablet   Notes to Pharmacy: FOR NEXT FILL   E-Prescribing Status: Receipt confirmed by pharmacy (09/29/2021 11:13 AM EDT)    [place], [place]

## 2021-11-30 NOTE — Telephone Encounter (Signed)
*  STAT* If patient is at the pharmacy, call can be transferred to refill team.   1. Which medications need to be refilled? (please list name of each medication and dose if known)   cloNIDine (CATAPRES) 0.1 MG tablet   Pt states she is taking this 3x daily     2. Which pharmacy/location (including street and city if local pharmacy) is medication to be sent to?   [place], [place]    3. Do they need a 30 day or 90 day supply? 90   Pt completely out

## 2021-12-03 ENCOUNTER — Ambulatory Visit: Payer: HMO | Attending: Nurse Practitioner

## 2021-12-03 DIAGNOSIS — I6523 Occlusion and stenosis of bilateral carotid arteries: Secondary | ICD-10-CM

## 2021-12-04 ENCOUNTER — Other Ambulatory Visit: Payer: Self-pay | Admitting: *Deleted

## 2021-12-04 DIAGNOSIS — I6523 Occlusion and stenosis of bilateral carotid arteries: Secondary | ICD-10-CM

## 2021-12-12 NOTE — Progress Notes (Unsigned)
Date:  12/14/2021   ID:  Yvonne Davidson, DOB March 02, 1940, MRN [id number]  Patient Location:  [place] [id number]   Provider location:   Yvonne Davidson, [place] office  PCP:  Yvonne Body, MD  Cardiologist:  Yvonne Davidson   Chief Complaint  Patient presents with   12 month follow up     "Doing well." Medications reviewed by the patient verbally.    History of Present Illness:    Yvonne Davidson is a 81 y.o. 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 on the left, 40-59% disease on the right,  Hyperlipidemia DM  hypertension,  obstructive sleep apnea who is followed at [facility],  chronic pain in her legs,  GERD,  Anxiety  OSA, uses bipap She presents for follow-up of her blood pressure and carotid disease  Last seen by myself in clinic October 2023 Feels well, does regular exercise Continues to have night time spikes in pressure, has OSA,  treated on BiPAP Periodically takes third clonidine in the middle of the night, feels better  Current medication list Clonidine 0.1 mg 8:30 pm  and 10 PM, sometimes overnight Bystolic 10 mg at dinner Olmesrtan 20 mg BID Extra clonidine at times, does not take clonidine in the morning  Got covid July 2022 Reports her sleeping is improved on BiPAP  Lab work reviewed A1C 7.2 Total chol 177, LDL 108  Carotid ultrasound reviewed with her  EKG personally reviewed by myself on todays visit Sinus bradycardia rate 48 bpm no ST-T wave changes  Other past medical history reviewed Followed by Dr. Lucky Davidson for renal artery stenosis Underwent renal angiogram April 14, 2020 : Aorta and iliac arteries were relatively normal.  Both renal arteries had calcific plaque with minimal stenosis of less than 25%. The SMA had some calcific plaque and stenosis in the 30% range but nothing significant.  Intolerances:  off amlodipine secondary to leg swelling  Unable  to tolerate any diuretics per the patient including HCTZ, Lasix, Aldactone She is cautious about taking extra doses of Bystolic or clonidine concerned about bradycardia but has never had symptoms  She stopped Crestor on her own Previously reported having mold in her attic in the past, felt it was affecting her breathing Had testing done on her house   For obstructive sleep apnea, she wears a mouth piece designed by [facility] dent[facility] .  she has chronic aching in her legs. She attributes this to sleep apnea and poor blood supply to her legs   She  retired at the age of 89. She was working in [employer]. She was unable to maintain the pace with such poor sleep on a chronic basis   Past Medical History:  Diagnosis Date   Allergy    Anemia    Anxiety    Arthritis    Bilateral cataracts    immature   Carotid arterial disease ([place])    a. RICA 95-63%, LICA 87-56%.   Chronic sinusitis    takes Cetirizine daily   Depression    doesn't take any meds   Diabetes mellitus without complication ([place])    borderline   Diastolic dysfunction    a. 05/2011: EF 60%, trace AI, trace MR, mild TR; b. 10/2016 Echo: EF 60-65%, no rmwa, GrI DD, triv AI, mild MR, mildly dil LA.   Diverticulosis    Dry eyes    GERD (gastroesophageal reflux  disease)    TAKES OMEPRAZOLE DAILY   Hashimoto's thyroiditis    History of blood transfusion    in the 60's no abnormal reaction noted   Hyperlipidemia    a. self discontinued statin   Hypertension    Insomnia    takes Trazodone nightly   Medication intolerance    OSA (obstructive sleep apnea)    doesn't use a cpap   Osteoporosis    takes Vit D as needed   PAD (peripheral artery disease) ([place])    a. 03/2020 Angio: Relatively nl Ao and iliac arteries. <25% bilat RAS. SMA 30%->Med rx.   PONV (postoperative nausea and vomiting)    hard to wake up   Renal artery stenosis ([place])    a. 03/2020 Renal angio: <25% bilat RAS.   Past Surgical History:  Procedure  Laterality Date   CHOLECYSTECTOMY  12/07/2012   Dr Yvonne Davidson   CHOLECYSTECTOMY N/A 12/07/2012   Procedure: LAPAROSCOPIC CHOLECYSTECTOMY WITH INTRAOPERATIVE CHOLANGIOGRAM;  Surgeon: Yvonne Medal, MD;  Location: [place];  Service: General;  Laterality: N/A;   LIPOSUCTION     NASAL SINUS SURGERY  2006   x 2   RENAL ANGIOGRAPHY Right 04/14/2020   Procedure: RENAL ANGIOGRAPHY;  Surgeon: Yvonne Huxley, MD;  Location: [place] [facility];  Service: Cardiovascular;  Laterality: Right;   right arm surgery  1960   TCS       Current Meds  Medication Sig   cloNIDine (CATAPRES) 0.1 MG tablet Take 1 tablet (0.1 mg total) by mouth 3 (three) times daily.   ezetimibe (ZETIA) 10 MG tablet Take 1 tablet (10 mg total) by mouth daily.   isosorbide dinitrate (ISORDIL) 20 MG tablet Take 1 tablet (20 mg total) by mouth 3 (three) times daily as needed (for pressure >160).   metFORMIN (GLUCOPHAGE) 500 MG tablet Take 250 mg by mouth 2 (two) times daily with a meal.   nebivolol (BYSTOLIC) 10 MG tablet Take 1 tablet (10 mg total) by mouth daily.   olmesartan (BENICAR) 20 MG tablet Take 1 tablet (20 mg) by mouth TWICE daily     Allergies:   Buprenorphine hcl, Chlorphen-diphenhyd-pe-apap, Codeine, Mold extract [trichophyton mentagrophyte], Morphine and related, Oxycodone, Oxycodone hcl, Oxycodone hcl, Sumycin [tetracycline hcl], Trichophyton, Amlodipine, Brompheniramine-pseudoeph, Cardura [doxazosin mesylate], Chlorphen-phenyleph-asa, Decongest-aid [pseudoephedrine], Esomeprazole magnesium, Esomeprazole magnesium, Hydralazine, Hydrochlorothiazide, Indomethacin, Neomycin-bacitracin-polymyxin [bacitracin-neomycin-polymyxin], Prednisone, Procaine hcl, Keflex [cephalexin], Latex, Neomycin-bacitracin zn-polymyx, Pseudoephedrine hcl er, Sudafed pe cold & cough child  [phenylephrine-dm], Telithromycin, and Tetracycline   Social History   Tobacco Use   Smoking status: Never   Smokeless tobacco: Never   Tobacco comments:     quit in 1978  Vaping Use   Vaping Use: Never used  Substance Use Topics   Alcohol use: No    Alcohol/week: 0.0 standard drinks of alcohol   Drug use: No     Family Hx: The patient's family history includes Alcohol abuse in her father; Alzheimer's disease in her brother; Aneurysm in her mother; Arthritis in her mother; Breast cancer in her mother; Cancer in her mother; Early death in her father; Heart disease in her mother; Hyperlipidemia in her mother; Hypertension in her mother; Rheum arthritis in her brother; Stroke in her mother.  ROS:   Please see the history of present illness.    Review of Systems  Constitutional: Negative.   HENT: Negative.    Respiratory: Negative.    Cardiovascular: Negative.   Gastrointestinal: Negative.   Musculoskeletal: Negative.   Neurological: Negative.  Psychiatric/Behavioral: Negative.    All other systems reviewed and are negative.    Labs/Other Tests and Data Reviewed:    Recent Labs: 04/29/2021: TSH 5.440 07/29/2021: ALT 17; BUN 22; Creatinine, Ser 1.02; Hemoglobin 12.3; Platelets 262; Potassium 4.0; Sodium 140   Recent Lipid Panel Lab Results  Component Value Date/Time   CHOL 126 03/03/2015 08:39 AM   TRIG 62 03/03/2015 08:39 AM   HDL 47 03/03/2015 08:39 AM   CHOLHDL 2.7 03/03/2015 08:39 AM   CHOLHDL 6 01/15/2014 08:17 AM   LDLCALC 67 03/03/2015 08:39 AM   LDLDIRECT 156.6 05/17/2012 03:44 PM    Wt Readings from Last 3 Encounters:  12/14/21 146 lb 4 oz (66.3 kg)  11/13/21 144 lb 6 oz (65.5 kg)  10/15/21 144 lb 6.4 oz (65.5 kg)     Exam:    Vital Signs: Vital signs may also be detailed in the HPI BP (!) 110/54 (BP Location: Left Arm, Patient Position: Sitting, Cuff Size: Normal)   Pulse (!) 48   Ht '5\' 2"'$  (1.575 m)   Wt 146 lb 4 oz (66.3 kg)   SpO2 94%   BMI 26.75 kg/m   Constitutional:  oriented to person, place, and time. No distress.  HENT:  Head: Grossly normal Eyes:  no discharge. No scleral icterus.  Neck: No JVD,  no carotid bruits  Cardiovascular: Regular rate and rhythm, no murmurs appreciated Pulmonary/Chest: Clear to auscultation bilaterally, no wheezes or rails Abdominal: Soft.  no distension.  no tenderness.  Musculoskeletal: Normal range of motion Neurological:  normal muscle tone. Coordination normal. No atrophy Skin: Skin warm and dry Psychiatric: normal affect, pleasant   ASSESSMENT & PLAN:    Bilateral carotid artery stenosis -  U/s nov 2023 1 to 39% right internal carotid artery stenosis with 60 to 79% left internal carotid artery stenosis  Previously declined statin, willing to try, will started crestor 5 mg daily  on Zetia 10 daily   Hypertension goal BP (blood pressure) < 140/90 - Blood pressure is well controlled on today's visit. No changes made to the medications.   Hyperlipidemia LDL goal <100 - Plan: EKG 12-Lead on zetia Cholesterol above goal Will try crestor 5 mg every other day, titrate up to 5 daily   OSA on CPAP - Plan: EKG 12-Lead On bipap, followed by [facility]/pulmonary  Anxiety Recommended walking program   Total encounter time more than 30 minutes  Greater than 50% was spent in counseling and coordination of care with the patient   Signed, Ida Rogue, MD  12/14/2021 9:24 AM    [place] Office [address], [place], [place] [address]

## 2021-12-14 ENCOUNTER — Other Ambulatory Visit: Payer: Self-pay | Admitting: Cardiovascular Disease

## 2021-12-14 ENCOUNTER — Ambulatory Visit: Payer: HMO | Attending: Cardiovascular Disease | Admitting: Cardiovascular Disease

## 2021-12-14 ENCOUNTER — Encounter: Payer: Self-pay | Admitting: Cardiovascular Disease

## 2021-12-14 VITALS — BP 110/54 | HR 48 | Ht 62.0 in | Wt 146.2 lb

## 2021-12-14 DIAGNOSIS — I771 Stricture of artery: Secondary | ICD-10-CM

## 2021-12-14 DIAGNOSIS — I10 Essential (primary) hypertension: Secondary | ICD-10-CM | POA: Diagnosis not present

## 2021-12-14 DIAGNOSIS — K551 Chronic vascular disorders of intestine: Secondary | ICD-10-CM

## 2021-12-14 DIAGNOSIS — I6523 Occlusion and stenosis of bilateral carotid arteries: Secondary | ICD-10-CM

## 2021-12-14 DIAGNOSIS — I739 Peripheral vascular disease, unspecified: Secondary | ICD-10-CM | POA: Diagnosis not present

## 2021-12-14 DIAGNOSIS — E1169 Type 2 diabetes mellitus with other specified complication: Secondary | ICD-10-CM

## 2021-12-14 DIAGNOSIS — E785 Hyperlipidemia, unspecified: Secondary | ICD-10-CM

## 2021-12-14 DIAGNOSIS — I272 Pulmonary hypertension, unspecified: Secondary | ICD-10-CM

## 2021-12-14 DIAGNOSIS — E782 Mixed hyperlipidemia: Secondary | ICD-10-CM

## 2021-12-14 DIAGNOSIS — I701 Atherosclerosis of renal artery: Secondary | ICD-10-CM

## 2021-12-14 DIAGNOSIS — R0602 Shortness of breath: Secondary | ICD-10-CM

## 2021-12-14 MED ORDER — ROSUVASTATIN CALCIUM 5 MG PO TABS: 5.0000 mg | ORAL_TABLET | Freq: Every day | ORAL | 3 refills | Status: DC

## 2021-12-14 NOTE — Patient Instructions (Signed)
Medication Instructions:  Start crestor 5 mg daily   If you need a refill on your cardiac medications before your next appointment, please call your pharmacy.   Lab work: No new labs needed  Testing/Procedures: No new testing needed  Follow-Up: At [facility], you and your health needs are our priority.  As part of our continuing mission to provide you with exceptional heart care, we have created designated Provider Care Teams.  These Care Teams include your primary Cardiologist (physician) and Advanced Practice Providers (APPs -  Physician Assistants and Nurse Practitioners) who all work together to provide you with the care you need, when you need it.  You will need a follow up appointment in 12 months  Providers on your designated Care Team:   Murray Hodgkins, NP Christell Faith, PA-C Cadence Kathlen Mody, [place]  COVID-19 Vaccine Information can be found at: [email or web address] For questions related to vaccine distribution or appointments, please email [email or web address] or call [phone].

## 2021-12-30 ENCOUNTER — Ambulatory Visit (INDEPENDENT_AMBULATORY_CARE_PROVIDER_SITE_OTHER): Payer: HMO

## 2021-12-30 DIAGNOSIS — G4733 Obstructive sleep apnea (adult) (pediatric): Secondary | ICD-10-CM

## 2021-12-30 NOTE — Progress Notes (Signed)
We went over her mask    Pt was seen by Claiborne Billings  RRT/RCP  from [facility]

## 2022-01-04 ENCOUNTER — Other Ambulatory Visit: Payer: Self-pay | Admitting: Cardiovascular Disease

## 2022-01-05 ENCOUNTER — Telehealth: Payer: Self-pay | Admitting: Cardiovascular Disease

## 2022-01-05 NOTE — Telephone Encounter (Signed)
Left a message for the patient to call back.  

## 2022-01-05 NOTE — Telephone Encounter (Signed)
Pt c/o medication issue:  1. Name of Medication:   olmesartan (BENICAR) 20 MG tablet   2. How are you currently taking this medication (dosage and times per day)?  As prescribed  3. Are you having a reaction (difficulty breathing--STAT)?   No  4. What is your medication issue?   Patient stated she would like to go back to the 40 mg prescription and she needs this refilled to [place], [place]. Patient stated the 20 mg dosage is not working for her.

## 2022-01-05 NOTE — Telephone Encounter (Signed)
Patient was call back to see if the medication was update to the '40mg'$ . Please advise

## 2022-01-07 NOTE — Telephone Encounter (Signed)
Patient very upset that the voicemail she received did not identify the name of who called or that it was Dr. Donivan Scull office. She is very upset and wants to know if she needs speak with Dr. Rockey Situ himself to get the prescription sent to the pharmacy or if she needs to find another doctors office. She says the office is getting worse and worse. She wants to make sure the nurse who calls gives her identity and Dr. Donivan Scull name in the voicemail.

## 2022-01-11 NOTE — Telephone Encounter (Signed)
Attempted to call the patient. No answer- I left a message to please call back to clarify how she has been taking her RX for her olmesartan as we have had this listed as:  olmesartan (BENICAR) 20 MG tablet 60 tablet 6 09/30/2021    Sig: Take 1 tablet (20 mg) by mouth TWICE daily   Sent to pharmacy as: olmesartan (BENICAR) 20 MG tablet   Notes to Pharmacy: Dose change   E-Prescribing Status: Receipt confirmed by pharmacy (09/30/2021  3:55 PM EDT)    [place], [place]    I advised that I wanted to confirm with her that she has been taking the olmesartan 20 mg BID and not once daily, or if she has been taking 20 mg BID and prefers to take a full 40 mg once daily.  I also advised that her refill that was sent in in September should still have allowed for her to have enough tables to take 20 mg - 2 tablets (40 mg) once daily and she had refills on this as well.  I asked that she call back in the morning and that she could ask for me directly.

## 2022-01-11 NOTE — Telephone Encounter (Signed)
  Pt is calling to f/u, pt said she is almost out medication. She said, she needs Olmesartan 40 mg called in to her pharmacy today

## 2022-01-12 MED ORDER — OLMESARTAN MEDOXOMIL 40 MG PO TABS: 40.0000 mg | ORAL_TABLET | Freq: Every day | ORAL | 3 refills | Status: DC

## 2022-01-12 NOTE — Telephone Encounter (Signed)
Pt returning call. Transferred to [employer], Therapist, sports.

## 2022-01-12 NOTE — Telephone Encounter (Signed)
I spoke with the patient and confirmed with her that she tried Olmesartan 20 mg BID for "a while" but this did not control her BP. She has been taking a full 40 mg once daily on the Olmesartan and feels this controls her BP much better.   The patient confirms she had previously taken Olmesartan 40 mg QD and "did fine."  I advised the patient I will send in Olmesartan 40 mg once daily to her pharmacy and notify Dr. Rockey Situ of the change.  The patient voices understanding and is agreeable.  She was very appreciative of the call.

## 2022-01-12 NOTE — Telephone Encounter (Signed)
Attempted to call the patient. No answer- I left a message to call back.

## 2022-02-04 ENCOUNTER — Ambulatory Visit: Payer: HMO | Admitting: Internal Medicine

## 2022-02-04 ENCOUNTER — Encounter: Payer: Self-pay | Admitting: Internal Medicine

## 2022-02-04 VITALS — BP 152/61 | HR 56 | Temp 97.6°F | Resp 16 | Ht 62.0 in | Wt 147.8 lb

## 2022-02-04 DIAGNOSIS — G4733 Obstructive sleep apnea (adult) (pediatric): Secondary | ICD-10-CM

## 2022-02-04 DIAGNOSIS — Z7189 Other specified counseling: Secondary | ICD-10-CM | POA: Diagnosis not present
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
## 2022-02-09 ENCOUNTER — Other Ambulatory Visit: Payer: Self-pay | Admitting: Cardiovascular Disease

## 2022-02-10 ENCOUNTER — Telehealth: Payer: Self-pay | Admitting: Cardiovascular Disease

## 2022-02-10 NOTE — Telephone Encounter (Signed)
*  STAT* If patient is at the pharmacy, call can be transferred to refill team.   1. Which medications need to be refilled? (please list name of each medication and dose if known)   nebivolol (BYSTOLIC) 10 MG tablet   2. Which pharmacy/location (including street and city if local pharmacy) is medication to be sent to?  [place], [place]   3. Do they need a 30 day or 90 day supply?   90 day  Patient stated she has 2 tablets left.

## 2022-02-10 NOTE — Telephone Encounter (Signed)
nebivolol (BYSTOLIC) 10 MG tablet 90 tablet 2 02/09/2022    Sig - Route: TAKE ONE TABLET BY MOUTH EVERY DAY - Oral   Sent to pharmacy as: nebivolol (BYSTOLIC) 10 MG tablet   Notes to Pharmacy: PT IS REQUESTING REFILL   E-Prescribing Status: Receipt confirmed by pharmacy (02/09/2022  1:23 PM EST)    [place], [place] to pharmacy yesterday-receipt confirmed.

## 2022-02-11 DIAGNOSIS — E782 Mixed hyperlipidemia: Secondary | ICD-10-CM | POA: Diagnosis not present

## 2022-02-11 DIAGNOSIS — E538 Deficiency of other specified B group vitamins: Secondary | ICD-10-CM | POA: Diagnosis not present

## 2022-02-11 DIAGNOSIS — E038 Other specified hypothyroidism: Secondary | ICD-10-CM | POA: Diagnosis not present

## 2022-02-11 DIAGNOSIS — E785 Hyperlipidemia, unspecified: Secondary | ICD-10-CM | POA: Diagnosis not present

## 2022-02-11 DIAGNOSIS — E1169 Type 2 diabetes mellitus with other specified complication: Secondary | ICD-10-CM | POA: Diagnosis not present

## 2022-02-15 DIAGNOSIS — G4733 Obstructive sleep apnea (adult) (pediatric): Secondary | ICD-10-CM | POA: Diagnosis not present

## 2022-02-16 DIAGNOSIS — G4733 Obstructive sleep apnea (adult) (pediatric): Secondary | ICD-10-CM | POA: Diagnosis not present

## 2022-02-18 DIAGNOSIS — E0500 Thyrotoxicosis with diffuse goiter without thyrotoxic crisis or storm: Secondary | ICD-10-CM | POA: Diagnosis not present

## 2022-02-18 DIAGNOSIS — E1169 Type 2 diabetes mellitus with other specified complication: Secondary | ICD-10-CM | POA: Diagnosis not present

## 2022-02-18 DIAGNOSIS — E782 Mixed hyperlipidemia: Secondary | ICD-10-CM | POA: Diagnosis not present

## 2022-02-18 DIAGNOSIS — E785 Hyperlipidemia, unspecified: Secondary | ICD-10-CM | POA: Diagnosis not present

## 2022-02-18 DIAGNOSIS — I10 Essential (primary) hypertension: Secondary | ICD-10-CM | POA: Diagnosis not present

## 2022-03-03 ENCOUNTER — Ambulatory Visit: Payer: HMO

## 2022-03-17 ENCOUNTER — Ambulatory Visit (INDEPENDENT_AMBULATORY_CARE_PROVIDER_SITE_OTHER): Payer: HMO

## 2022-03-17 DIAGNOSIS — G4733 Obstructive sleep apnea (adult) (pediatric): Secondary | ICD-10-CM

## 2022-03-17 NOTE — Progress Notes (Signed)
95 percentile pressure  12.8/8.8   95th percentile leak 15.8   apnea index 1.8 /hr  apnea-hypopnea index  2.1 /hr   total days used  >4 hr 29 days  total days used <4 hr 0 days  Total compliance 97 percent  She is doing good wearing. Bipap but needs phycisian to go over blood pressure   Pt was seen by Claiborne Billings  RRT/RCP  from [facility]

## 2022-03-18 DIAGNOSIS — G4733 Obstructive sleep apnea (adult) (pediatric): Secondary | ICD-10-CM | POA: Diagnosis not present

## 2022-03-19 DIAGNOSIS — H43813 Vitreous degeneration, bilateral: Secondary | ICD-10-CM | POA: Diagnosis not present

## 2022-03-19 DIAGNOSIS — Z961 Presence of intraocular lens: Secondary | ICD-10-CM | POA: Diagnosis not present

## 2022-03-19 DIAGNOSIS — E119 Type 2 diabetes mellitus without complications: Secondary | ICD-10-CM | POA: Diagnosis not present

## 2022-03-19 DIAGNOSIS — M3501 Sicca syndrome with keratoconjunctivitis: Secondary | ICD-10-CM | POA: Diagnosis not present

## 2022-03-24 DIAGNOSIS — G4733 Obstructive sleep apnea (adult) (pediatric): Secondary | ICD-10-CM | POA: Diagnosis not present

## 2022-03-31 DIAGNOSIS — I10 Essential (primary) hypertension: Secondary | ICD-10-CM | POA: Diagnosis not present

## 2022-03-31 DIAGNOSIS — F411 Generalized anxiety disorder: Secondary | ICD-10-CM | POA: Diagnosis not present

## 2022-03-31 DIAGNOSIS — G4733 Obstructive sleep apnea (adult) (pediatric): Secondary | ICD-10-CM | POA: Diagnosis not present

## 2022-04-01 ENCOUNTER — Ambulatory Visit: Payer: HMO | Admitting: Internal Medicine

## 2022-04-01 ENCOUNTER — Encounter: Payer: Self-pay | Admitting: Internal Medicine

## 2022-04-01 VITALS — BP 150/54 | HR 53 | Temp 98.5°F | Resp 16 | Ht 62.0 in | Wt 148.8 lb

## 2022-04-01 DIAGNOSIS — G4733 Obstructive sleep apnea (adult) (pediatric): Secondary | ICD-10-CM | POA: Diagnosis not present

## 2022-04-01 DIAGNOSIS — Z789 Other specified health status: Secondary | ICD-10-CM

## 2022-04-01 DIAGNOSIS — Z7189 Other specified counseling: Secondary | ICD-10-CM
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
## 2022-04-01 NOTE — Patient Instructions (Signed)

## 2022-04-02 ENCOUNTER — Telehealth: Payer: Self-pay | Admitting: Internal Medicine

## 2022-04-02 NOTE — Telephone Encounter (Signed)
Patient seen in office yesterday in regards to her not being able to tolerate her bipap. Patient returned to office after checking out stating [facility] will need new cpap order, pressure settings and office notes before ordering cpap. Per dsk, patient did not state she wanted new cpap, just that she starting using her old one and she felt better. I sent message to Ashly with [employer] to see if her insurance will pay for new cpap since she recently received Bipap and to see if they can convert bipap to cpap with appropriate settings-Toni

## 2022-04-12 ENCOUNTER — Telehealth: Payer: Self-pay | Admitting: Internal Medicine

## 2022-04-12 NOTE — Telephone Encounter (Signed)
Patient called today requesting update on getting cpap. She stated since she has been using her old one, her BP has been staying down. Sent msg to [employer] with [employer]-Toni

## 2022-04-16 DIAGNOSIS — G4733 Obstructive sleep apnea (adult) (pediatric): Secondary | ICD-10-CM | POA: Diagnosis not present

## 2022-04-19 DIAGNOSIS — E1169 Type 2 diabetes mellitus with other specified complication: Secondary | ICD-10-CM | POA: Diagnosis not present

## 2022-04-19 DIAGNOSIS — E785 Hyperlipidemia, unspecified: Secondary | ICD-10-CM | POA: Diagnosis not present

## 2022-04-19 DIAGNOSIS — E0500 Thyrotoxicosis with diffuse goiter without thyrotoxic crisis or storm: Secondary | ICD-10-CM | POA: Diagnosis not present

## 2022-04-19 DIAGNOSIS — E782 Mixed hyperlipidemia: Secondary | ICD-10-CM | POA: Diagnosis not present

## 2022-04-22 DIAGNOSIS — E782 Mixed hyperlipidemia: Secondary | ICD-10-CM | POA: Diagnosis not present

## 2022-04-22 DIAGNOSIS — E785 Hyperlipidemia, unspecified: Secondary | ICD-10-CM | POA: Diagnosis not present

## 2022-04-22 DIAGNOSIS — E0500 Thyrotoxicosis with diffuse goiter without thyrotoxic crisis or storm: Secondary | ICD-10-CM | POA: Diagnosis not present

## 2022-04-22 DIAGNOSIS — E1169 Type 2 diabetes mellitus with other specified complication: Secondary | ICD-10-CM | POA: Diagnosis not present

## 2022-04-23 DIAGNOSIS — G4733 Obstructive sleep apnea (adult) (pediatric): Secondary | ICD-10-CM | POA: Diagnosis not present

## 2022-04-28 ENCOUNTER — Ambulatory Visit (INDEPENDENT_AMBULATORY_CARE_PROVIDER_SITE_OTHER): Payer: HMO

## 2022-04-28 DIAGNOSIS — G4733 Obstructive sleep apnea (adult) (pediatric): Secondary | ICD-10-CM | POA: Diagnosis not present

## 2022-04-28 NOTE — Progress Notes (Signed)
Change bipap to cpap pressure of 13 to start with ramp at 10, will see her back in 8 weeks    Pt was seen by Claiborne Billings  RRT/RCP  from [facility]

## 2022-05-06 DIAGNOSIS — E038 Other specified hypothyroidism: Secondary | ICD-10-CM | POA: Diagnosis not present

## 2022-05-07 DIAGNOSIS — G4733 Obstructive sleep apnea (adult) (pediatric): Secondary | ICD-10-CM | POA: Diagnosis not present

## 2022-05-17 ENCOUNTER — Other Ambulatory Visit: Payer: Self-pay

## 2022-05-17 DIAGNOSIS — R0602 Shortness of breath: Secondary | ICD-10-CM

## 2022-05-17 DIAGNOSIS — G4733 Obstructive sleep apnea (adult) (pediatric): Secondary | ICD-10-CM | POA: Diagnosis not present

## 2022-05-19 ENCOUNTER — Ambulatory Visit: Payer: HMO | Admitting: Internal Medicine

## 2022-05-25 DIAGNOSIS — E038 Other specified hypothyroidism: Secondary | ICD-10-CM | POA: Diagnosis not present

## 2022-05-26 ENCOUNTER — Ambulatory Visit (INDEPENDENT_AMBULATORY_CARE_PROVIDER_SITE_OTHER): Payer: HMO | Admitting: Internal Medicine

## 2022-05-26 DIAGNOSIS — R0602 Shortness of breath: Secondary | ICD-10-CM

## 2022-06-01 NOTE — Procedures (Signed)
[facility] [address] [place], [address]    Complete Pulmonary Function Testing Interpretation:  FINDINGS:  Forced vital capacity is normal FEV1 is normal FEV1 FVC ratio was mildly decreased.  Postbronchodilator no significant change in FEV1 is noted.  Total lung capacity is normal residual volume is normal residual in the lung capacity ratio was normal FRC is normal.  DLCO was normal.  IMPRESSION:  This pulmonary function study is within normal limits  Yevonne Pax, MD [facility] Pulmonary Critical Care Medicine Sleep Medicine

## 2022-06-09 DIAGNOSIS — G4733 Obstructive sleep apnea (adult) (pediatric): Secondary | ICD-10-CM | POA: Diagnosis not present

## 2022-06-16 DIAGNOSIS — G4733 Obstructive sleep apnea (adult) (pediatric): Secondary | ICD-10-CM | POA: Diagnosis not present

## 2022-06-30 ENCOUNTER — Ambulatory Visit: Payer: HMO

## 2022-06-30 ENCOUNTER — Ambulatory Visit (INDEPENDENT_AMBULATORY_CARE_PROVIDER_SITE_OTHER): Payer: HMO

## 2022-06-30 DIAGNOSIS — G4733 Obstructive sleep apnea (adult) (pediatric): Secondary | ICD-10-CM | POA: Diagnosis not present

## 2022-06-30 NOTE — Progress Notes (Signed)
95 percentile pressure 13   95th percentile leak 30.8   apnea index 0.4 /hr  apnea-hypopnea index  1.2 /hr   total days used  >4 hr 30 days  total days used <4 hr 0 days  Total compliance 100 percent  Patient states she is very tired during the day, states wakes up very tired and exhausted when goes to bed, and taking naps during day. Maybe do ONO on cpap  just to rule out.? AHI very controlled advised to speak with Dr. Welton Flakes.  Pt was seen by Tresa Endo  RRT/RCP  from [facility]

## 2022-07-09 DIAGNOSIS — G4733 Obstructive sleep apnea (adult) (pediatric): Secondary | ICD-10-CM | POA: Diagnosis not present

## 2022-07-17 DIAGNOSIS — G4733 Obstructive sleep apnea (adult) (pediatric): Secondary | ICD-10-CM | POA: Diagnosis not present

## 2022-07-23 DIAGNOSIS — R059 Cough, unspecified: Secondary | ICD-10-CM | POA: Diagnosis not present

## 2022-07-23 DIAGNOSIS — R6889 Other general symptoms and signs: Secondary | ICD-10-CM | POA: Diagnosis not present

## 2022-07-23 DIAGNOSIS — R0989 Other specified symptoms and signs involving the circulatory and respiratory systems: Secondary | ICD-10-CM | POA: Diagnosis not present

## 2022-07-23 DIAGNOSIS — R0602 Shortness of breath: Secondary | ICD-10-CM | POA: Diagnosis not present

## 2022-07-26 LAB — PULMONARY FUNCTION TEST

## 2022-07-27 DIAGNOSIS — E038 Other specified hypothyroidism: Secondary | ICD-10-CM | POA: Diagnosis not present

## 2022-07-27 DIAGNOSIS — E1165 Type 2 diabetes mellitus with hyperglycemia: Secondary | ICD-10-CM | POA: Diagnosis not present

## 2022-08-03 ENCOUNTER — Telehealth: Payer: Self-pay | Admitting: Family Medicine

## 2022-08-03 ENCOUNTER — Telehealth: Payer: Self-pay | Admitting: Nurse Practitioner

## 2022-08-03 NOTE — Telephone Encounter (Signed)
Received [facility] order form. Gave to AA for signature.-nm  

## 2022-08-05 ENCOUNTER — Telehealth: Payer: Self-pay | Admitting: Nurse Practitioner

## 2022-08-05 ENCOUNTER — Encounter: Payer: Self-pay | Admitting: Nurse Practitioner

## 2022-08-05 ENCOUNTER — Ambulatory Visit (INDEPENDENT_AMBULATORY_CARE_PROVIDER_SITE_OTHER): Payer: HMO | Admitting: Nurse Practitioner

## 2022-08-05 VITALS — BP 138/68 | HR 60 | Temp 96.9°F | Resp 16 | Ht 62.0 in | Wt 146.6 lb

## 2022-08-05 DIAGNOSIS — G4719 Other hypersomnia: Secondary | ICD-10-CM | POA: Diagnosis not present

## 2022-08-05 DIAGNOSIS — G4733 Obstructive sleep apnea (adult) (pediatric): Secondary | ICD-10-CM | POA: Diagnosis not present

## 2022-08-05 DIAGNOSIS — Z7189 Other specified counseling: Secondary | ICD-10-CM

## 2022-08-05 DIAGNOSIS — R0602 Shortness of breath: Secondary | ICD-10-CM | POA: Diagnosis not present

## 2022-08-05 DIAGNOSIS — Z789 Other specified health status: Secondary | ICD-10-CM | POA: Diagnosis not present

## 2022-08-05 NOTE — Telephone Encounter (Addendum)
Notified Ashly, Melissa [employer] Terri with [employer] of supply order-Toni

## 2022-08-05 NOTE — Progress Notes (Signed)
[facility] [address] [place], [place] [address]  Internal MEDICINE  Office Visit Note  Patient Name: Yvonne Davidson  [id number]  [id number]  Date of Service: 08/05/2022  Chief Complaint  Patient presents with   Follow-up    HPI Yvonne Davidson presents for a follow-up visit for OSA, SOB, CPAP, excessive daytime sleepiness.  Patient reports having a bad leak when using CPAP due to her being a mouth-breather when she sleeps. Likes the nasal mask. Also had a PFT done recently which was grossly normal and showing no improvement with bronchodilator treatment.   CPAP Download: 95 percentile pressure 13 95th percentile leak 30.8 apnea index 0.4 /hr apnea-hypopnea index  1.2 /hr total days used  >4 hr 30 days total days used <4 hr 0 days Total compliance 100 percent   Patient states she is very tired during the day, states wakes up very tired and exhausted when goes to bed, and taking naps during day. Maybe do ONO on cpap  just to rule out.? AHI very controlled    Pt was seen by Tresa Endo  RRT/RCP  from [facility] Still tired, has borderline low vitamin b12, has not had her vitamin D level checked, talked to patient about discussing lab draw with PCP  Current Medication: Outpatient Encounter Medications as of 08/05/2022  Medication Sig   B-D ULTRA-FINE 33 LANCETS MISC Use as directed. Dx 250.0   Blood Glucose Monitoring Suppl (ONE TOUCH ULTRA MINI) W/DEVICE KIT USE AS DIRECTED   cloNIDine (CATAPRES) 0.1 MG tablet Take 1 tablet (0.1 mg total) by mouth 3 (three) times daily.   ezetimibe (ZETIA) 10 MG tablet TAKE ONE TABLET BY MOUTH EVERY DAY   isosorbide dinitrate (ISORDIL) 20 MG tablet Take 1 tablet (20 mg total) by mouth 3 (three) times daily as needed (for pressure >160).   metFORMIN (GLUCOPHAGE) 500 MG tablet Take 250 mg by mouth 2 (two) times daily with a meal.   nebivolol (BYSTOLIC) 10 MG tablet TAKE ONE TABLET BY MOUTH EVERY DAY   olmesartan (BENICAR) 40 MG tablet Take 1  tablet (40 mg total) by mouth daily.   ONETOUCH DELICA LANCETS 33G MISC Use as directed. Dx 250.0   rosuvastatin (CRESTOR) 5 MG tablet Take 1 tablet (5 mg total) by mouth daily.   No facility-administered encounter medications on file as of 08/05/2022.    Surgical History: Past Surgical History:  Procedure Laterality Date   CHOLECYSTECTOMY  12/07/2012   Dr Ezzard Standing   CHOLECYSTECTOMY N/A 12/07/2012   Procedure: LAPAROSCOPIC CHOLECYSTECTOMY WITH INTRAOPERATIVE CHOLANGIOGRAM;  Surgeon: Kandis Cocking, MD;  Location: [facility] OR;  Service: General;  Laterality: N/A;   LIPOSUCTION     NASAL SINUS SURGERY  2006   x 2   RENAL ANGIOGRAPHY Right 04/14/2020   Procedure: RENAL ANGIOGRAPHY;  Surgeon: Annice Needy, MD;  Location: [facility] INVASIVE CV LAB;  Service: Cardiovascular;  Laterality: Right;   right arm surgery  1960   TCS      Medical History: Past Medical History:  Diagnosis Date   Allergy    Anemia    Anxiety    Arthritis    Bilateral cataracts    immature   Carotid arterial disease (HCC)    a. RICA 40-59%, LICA 60-79%.   Chronic sinusitis    takes Cetirizine daily   Depression    doesn't take any meds   Diabetes mellitus without complication (HCC)    borderline   Diastolic dysfunction    a. 05/2011: EF 60%,  trace AI, trace MR, mild TR; b. 10/2016 Echo: EF 60-65%, no rmwa, GrI DD, triv AI, mild MR, mildly dil LA.   Diverticulosis    Dry eyes    GERD (gastroesophageal reflux disease)    TAKES OMEPRAZOLE DAILY   Hashimoto's thyroiditis    History of blood transfusion    in the 60's no abnormal reaction noted   Hyperlipidemia    a. self discontinued statin   Hypertension    Insomnia    takes Trazodone nightly   Medication intolerance    OSA (obstructive sleep apnea)    doesn't use a cpap   Osteoporosis    takes Vit D as needed   PAD (peripheral artery disease) (HCC)    a. 03/2020 Angio: Relatively nl Ao and iliac arteries. <25% bilat RAS. SMA 30%->Med rx.   PONV  (postoperative nausea and vomiting)    hard to wake up   Renal artery stenosis (HCC)    a. 03/2020 Renal angio: <25% bilat RAS.   Unspecified gastritis and gastroduodenitis without mention of hemorrhage 05/17/2012    Family History: Family History  Problem Relation Age of Onset   Arthritis Mother    Heart disease Mother    Hyperlipidemia Mother    Hypertension Mother    Stroke Mother    Cancer Mother        Breast & Uterine - 20'-30's   Aneurysm Mother    Breast cancer Mother    Early death Father        Fire   Alcohol abuse Father    Alzheimer's disease Brother    Rheum arthritis Brother     Social History   Socioeconomic History   Marital status: Married    Spouse name: Not on file   Number of children: 4   Years of education: Not on file   Highest education level: Not on file  Occupational History   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 Has 4 children.   Social Determinants of Health   Financial Resource Strain: Not on file  Food Insecurity: Not on file  Transportation Needs: Not on file  Physical Activity: Not on file  Stress: Not on file  Social Connections: Not on file  Intimate Partner Violence: Not on file      Review of Systems  Constitutional:  Positive for fatigue. Negative for chills and unexpected weight change.  HENT:  Negative for congestion, rhinorrhea, sneezing and sore throat.   Respiratory:  Positive for apnea (AHI controlled on CPAP). Negative for cough, chest tightness, shortness of breath and wheezing.   Cardiovascular: Negative.  Negative for chest pain and  palpitations.  Neurological: Negative.  Negative for tremors and numbness.  Psychiatric/Behavioral:  Positive for sleep disturbance. Negative for behavioral problems (Depression).     Vital Signs: BP (!) 148/70   Pulse 60   Temp (!) 96.9 F (36.1 C)   Resp 16   Ht 5\' 2"  (1.575 m)   Wt 146 lb 9.6 oz (66.5 kg)   SpO2 99%  BMI 26.81 kg/m    Physical Exam Vitals reviewed.  Constitutional:      General: She is not in acute distress.    Appearance: Normal appearance. She is not ill-appearing.  HENT:     Head: Normocephalic and atraumatic.  Eyes:     Pupils: Pupils are equal, round, and reactive to light.  Cardiovascular:     Rate and Rhythm: Normal rate and regular rhythm.  Pulmonary:     Effort: Pulmonary effort is normal. No respiratory distress.  Neurological:     Mental Status: She is alert and oriented to person, place, and time.  Psychiatric:        Mood and Affect: Mood normal.        Behavior: Behavior normal.        Assessment/Plan: 1. OSA (obstructive sleep apnea) CPAP order for a chin strap ordered also need an overnight pulse oximetry study - For home use only DME continuous positive airway pressure (CPAP) - Overnight Pulse Oximetry Study; Future  2. Excessive daytime sleepiness Have ONO study done on CPAP  - Overnight Pulse Oximetry Study; Future  3. SOB (shortness of breath) ONO study on cpap ordered  - Overnight Pulse Oximetry Study; Future  4. CPAP use counseling Ordered Chin strap for CPAP so that she does not have such a large leak  - For home use only DME continuous positive airway pressure (CPAP)  5. Difficulty with CPAP use ONO study ordered to be done on CPAP - Overnight Pulse Oximetry Study; Future   General Counseling: geral tuch understanding of the findings of todays visit and agrees with plan of treatment. I have discussed any further diagnostic evaluation that may be needed or ordered today. We also reviewed her  medications today. she has been encouraged to call the office with any questions or concerns that should arise related to todays visit.    Orders Placed This Encounter  Procedures   For home use only DME continuous positive airway pressure (CPAP)   Overnight Pulse Oximetry Study    No orders of the defined types were placed in this encounter.   Return in about 6 months (around 02/05/2023) for F/U, pulmonary/sleep, Klynn Linnemann or DSK.   Total time spent:30 Minutes Time spent includes review of chart, medications, test results, and follow up plan with the patient.   [place] Controlled Substance Database was reviewed by me.  This patient was seen by Sallyanne Kuster, FNP-C in collaboration with Dr. Beverely Risen as a part of collaborative care agreement.   Nerissa Constantin R. Tedd Sias, MSN, FNP-C Internal medicine

## 2022-08-06 DIAGNOSIS — E1169 Type 2 diabetes mellitus with other specified complication: Secondary | ICD-10-CM | POA: Diagnosis not present

## 2022-08-06 DIAGNOSIS — E785 Hyperlipidemia, unspecified: Secondary | ICD-10-CM | POA: Diagnosis not present

## 2022-08-06 DIAGNOSIS — E782 Mixed hyperlipidemia: Secondary | ICD-10-CM | POA: Diagnosis not present

## 2022-08-07 ENCOUNTER — Encounter: Payer: Self-pay | Admitting: Nurse Practitioner

## 2022-08-09 ENCOUNTER — Telehealth: Payer: Self-pay | Admitting: Nurse Practitioner

## 2022-08-09 DIAGNOSIS — E538 Deficiency of other specified B group vitamins: Secondary | ICD-10-CM | POA: Diagnosis not present

## 2022-08-09 DIAGNOSIS — I10 Essential (primary) hypertension: Secondary | ICD-10-CM | POA: Diagnosis not present

## 2022-08-09 DIAGNOSIS — Z0000 Encounter for general adult medical examination without abnormal findings: Secondary | ICD-10-CM | POA: Diagnosis not present

## 2022-08-09 DIAGNOSIS — E782 Mixed hyperlipidemia: Secondary | ICD-10-CM | POA: Diagnosis not present

## 2022-08-09 DIAGNOSIS — K551 Chronic vascular disorders of intestine: Secondary | ICD-10-CM | POA: Diagnosis not present

## 2022-08-09 NOTE — Telephone Encounter (Signed)
Pap supply order signed, faxed back to [facility]; [phone]. Scanned-Toni

## 2022-08-09 NOTE — Telephone Encounter (Signed)
Notified [facility] of overnight pulseox for patient. Notified patient they will be contacting her-Yvonne Davidson

## 2022-08-11 DIAGNOSIS — G4733 Obstructive sleep apnea (adult) (pediatric): Secondary | ICD-10-CM | POA: Diagnosis not present

## 2022-08-16 DIAGNOSIS — G4733 Obstructive sleep apnea (adult) (pediatric): Secondary | ICD-10-CM | POA: Diagnosis not present

## 2022-08-19 NOTE — Telephone Encounter (Signed)
Error

## 2022-08-26 ENCOUNTER — Telehealth: Payer: Self-pay | Admitting: Internal Medicine

## 2022-08-26 NOTE — Telephone Encounter (Signed)
Notified patient of overnight pulseox results. No  o2 needed during sleep-Yvonne Davidson

## 2022-09-15 ENCOUNTER — Ambulatory Visit: Payer: HMO

## 2022-09-29 DIAGNOSIS — M79671 Pain in right foot: Secondary | ICD-10-CM | POA: Diagnosis not present

## 2022-09-29 DIAGNOSIS — M79672 Pain in left foot: Secondary | ICD-10-CM | POA: Diagnosis not present

## 2022-10-07 ENCOUNTER — Telehealth: Payer: Self-pay | Admitting: Internal Medicine

## 2022-10-07 NOTE — Telephone Encounter (Signed)
Corrected 04/01/22 office note faxed to [facility]; [phone]

## 2022-10-15 DIAGNOSIS — M65872 Other synovitis and tenosynovitis, left ankle and foot: Secondary | ICD-10-CM | POA: Diagnosis not present

## 2022-10-15 DIAGNOSIS — E119 Type 2 diabetes mellitus without complications: Secondary | ICD-10-CM | POA: Diagnosis not present

## 2022-10-15 DIAGNOSIS — M65871 Other synovitis and tenosynovitis, right ankle and foot: Secondary | ICD-10-CM | POA: Diagnosis not present

## 2022-10-15 DIAGNOSIS — M898X9 Other specified disorders of bone, unspecified site: Secondary | ICD-10-CM | POA: Diagnosis not present

## 2022-10-15 DIAGNOSIS — M659 Synovitis and tenosynovitis, unspecified: Secondary | ICD-10-CM | POA: Diagnosis not present

## 2022-10-15 DIAGNOSIS — M76821 Posterior tibial tendinitis, right leg: Secondary | ICD-10-CM | POA: Diagnosis not present

## 2022-10-15 DIAGNOSIS — Q667 Congenital pes cavus, unspecified foot: Secondary | ICD-10-CM | POA: Diagnosis not present

## 2022-10-19 ENCOUNTER — Other Ambulatory Visit: Payer: Self-pay | Admitting: Cardiovascular Disease

## 2022-10-19 ENCOUNTER — Telehealth: Payer: Self-pay | Admitting: Cardiovascular Disease

## 2022-10-19 NOTE — Telephone Encounter (Signed)
*  STAT* If patient is at the pharmacy, call can be transferred to refill team.   1. Which medications need to be refilled? (please list name of each medication and dose if known) Nebivolol    2. Would you like to learn more about the convenience, safety, & potential cost savings by using the [facility]? yes     3. Are you open to using the [facility] (Type [facility]. yes ).   4. Which pharmacy/location (including street and city if local pharmacy) is medication to be sent to?[facility]   5. Do they need a 30 day or 90 day supply? 90

## 2022-11-12 ENCOUNTER — Other Ambulatory Visit: Payer: Self-pay | Admitting: Cardiovascular Disease

## 2022-11-18 DIAGNOSIS — G4733 Obstructive sleep apnea (adult) (pediatric): Secondary | ICD-10-CM | POA: Diagnosis not present

## 2022-11-23 DIAGNOSIS — E1165 Type 2 diabetes mellitus with hyperglycemia: Secondary | ICD-10-CM | POA: Diagnosis not present

## 2022-11-23 DIAGNOSIS — E038 Other specified hypothyroidism: Secondary | ICD-10-CM | POA: Diagnosis not present

## 2022-11-29 ENCOUNTER — Ambulatory Visit (INDEPENDENT_AMBULATORY_CARE_PROVIDER_SITE_OTHER): Payer: HMO | Admitting: Nurse Practitioner

## 2022-11-29 ENCOUNTER — Encounter: Payer: Self-pay | Admitting: Nurse Practitioner

## 2022-11-29 VITALS — BP 160/68 | HR 50 | Temp 98.1°F | Resp 16 | Ht 62.0 in | Wt 149.8 lb

## 2022-11-29 DIAGNOSIS — G4719 Other hypersomnia: Secondary | ICD-10-CM

## 2022-11-29 DIAGNOSIS — I10 Essential (primary) hypertension: Secondary | ICD-10-CM

## 2022-11-29 DIAGNOSIS — G4733 Obstructive sleep apnea (adult) (pediatric): Secondary | ICD-10-CM | POA: Diagnosis not present

## 2022-11-29 NOTE — Progress Notes (Signed)
[facility] [address] [place], [place] [address]  Internal MEDICINE  Office Visit Note  Patient Name: Yvonne Davidson  [id number]  [id number]  Date of Service: 11/29/2022  Chief Complaint  Patient presents with   Acute Visit    Cpap needs to be increased.      HPI Yvonne Davidson presents for an acute sick visit for symptoms increasing of OSA and BP higher.  Onset -- 2 -3 weeks ago.  BP up and OSA seems to be worse.  Wakes up gasping and stopping breathing a lot  Waking up tired. Again but usually machine as instructed Current setting is 13 cmH2O    Current Medication:  Outpatient Encounter Medications as of 11/29/2022  Medication Sig   B-D ULTRA-FINE 33 LANCETS MISC Use as directed. Dx 250.0   Blood Glucose Monitoring Suppl (ONE TOUCH ULTRA MINI) W/DEVICE KIT USE AS DIRECTED   cloNIDine (CATAPRES) 0.1 MG tablet TAKE 1 TABLET BY MOUTH THREE TIMES DAILY   ezetimibe (ZETIA) 10 MG tablet TAKE ONE TABLET BY MOUTH EVERY DAY   isosorbide dinitrate (ISORDIL) 20 MG tablet Take 1 tablet (20 mg total) by mouth 3 (three) times daily as needed (for pressure >160).   metFORMIN (GLUCOPHAGE) 500 MG tablet Take 250 mg by mouth 2 (two) times daily with a meal.   nebivolol (BYSTOLIC) 10 MG tablet TAKE ONE TABLET BY MOUTH EVERY DAY   olmesartan (BENICAR) 40 MG tablet TAKE ONE TABLET BY MOUTH EVERY DAY   ONETOUCH DELICA LANCETS 33G MISC Use as directed. Dx 250.0   rosuvastatin (CRESTOR) 5 MG tablet Take 1 tablet (5 mg total) by mouth daily.   No facility-administered encounter medications on file as of 11/29/2022.      Medical History: Past Medical History:  Diagnosis Date   Allergy    Anemia    Anxiety    Arthritis    Bilateral cataracts    immature   Carotid arterial disease (HCC)    a. RICA 40-59%, LICA 60-79%.   Chronic sinusitis    takes Cetirizine daily   Depression    doesn't take any meds   Diabetes mellitus without complication (HCC)    borderline    Diastolic dysfunction    a. 05/2011: EF 60%, trace AI, trace MR, mild TR; b. 10/2016 Echo: EF 60-65%, no rmwa, GrI DD, triv AI, mild MR, mildly dil LA.   Diverticulosis    Dry eyes    GERD (gastroesophageal reflux disease)    TAKES OMEPRAZOLE DAILY   Hashimoto's thyroiditis    History of blood transfusion    in the 60's no abnormal reaction noted   Hyperlipidemia    a. self discontinued statin   Hypertension    Insomnia    takes Trazodone nightly   Medication intolerance    OSA (obstructive sleep apnea)    doesn't use a cpap   Osteoporosis    takes Vit D as needed   PAD (peripheral artery disease) (HCC)    a. 03/2020 Angio: Relatively nl Ao and iliac arteries. <25% bilat RAS. SMA 30%->Med rx.   PONV (postoperative nausea and vomiting)    hard to wake up   Renal artery stenosis (HCC)    a. 03/2020 Renal angio: <25% bilat RAS.   Unspecified gastritis and gastroduodenitis without mention of hemorrhage 05/17/2012     Vital Signs: BP (!) 160/68   Pulse (!) 50   Temp 98.1 F (36.7 C)   Resp 16   Ht 5\' 2"  (  1.575 m)   Wt 149 lb 12.8 oz (67.9 kg)   SpO2 97%   BMI 27.40 kg/m    Review of Systems  Constitutional:  Positive for fatigue.  Respiratory:  Positive for apnea (episodes of apnea at night) and choking. Negative for shortness of breath and stridor.   Cardiovascular: Negative.  Negative for chest pain and palpitations.  Musculoskeletal: Negative.   Psychiatric/Behavioral:  Positive for sleep disturbance. Negative for behavioral problems, self-injury and suicidal ideas. The patient is not nervous/anxious.     Physical Exam Vitals reviewed.  Constitutional:      General: She is not in acute distress.    Appearance: Normal appearance. She is not ill-appearing.  HENT:     Head: Normocephalic and atraumatic.  Eyes:     Pupils: Pupils are equal, round, and reactive to light.  Cardiovascular:     Rate and Rhythm: Normal rate and regular rhythm.  Pulmonary:     Effort:  Pulmonary effort is normal. No respiratory distress.  Neurological:     Mental Status: She is alert and oriented to person, place, and time.  Psychiatric:        Mood and Affect: Mood normal.        Behavior: Behavior normal.       Assessment/Plan: 1. OSA (obstructive sleep apnea) Pressure setting increase to 15 cmH2O - For home use only DME continuous positive airway pressure (CPAP)  2. Excessive daytime sleepiness Pressure setting increase to 15 cmH2O - For home use only DME continuous positive airway pressure (CPAP)  3. Essential hypertension Pressure setting increase to 15 cmH2O and follow up with PCP for any further issues related to BP and not OSA - For home use only DME continuous positive airway pressure (CPAP)   General Counseling: Yvonne Davidson verbalizes understanding of the findings of todays visit and agrees with plan of treatment. I have discussed any further diagnostic evaluation that may be needed or ordered today. We also reviewed her medications today. she has been encouraged to call the office with any questions or concerns that should arise related to todays visit.    Counseling:    Orders Placed This Encounter  Procedures   For home use only DME continuous positive airway pressure (CPAP)    No orders of the defined types were placed in this encounter.   Return for f/u as needed if any more issues but keep scheduled routine follow up. .  [place] Controlled Substance Database was reviewed by me for overdose risk score (ORS)  Time spent:30 Minutes Time spent with patient included reviewing progress notes, labs, imaging studies, and discussing plan for follow up.   This patient was seen by Sallyanne Kuster, FNP-C in collaboration with Dr. Beverely Risen as a part of collaborative care agreement.  Alizza Sacra R. Tedd Sias, MSN, FNP-C Internal Medicine

## 2022-11-30 DIAGNOSIS — E038 Other specified hypothyroidism: Secondary | ICD-10-CM | POA: Diagnosis not present

## 2022-11-30 DIAGNOSIS — E1165 Type 2 diabetes mellitus with hyperglycemia: Secondary | ICD-10-CM | POA: Diagnosis not present

## 2022-12-06 ENCOUNTER — Other Ambulatory Visit: Payer: Self-pay | Admitting: Emergency Medicine

## 2022-12-06 ENCOUNTER — Telehealth: Payer: Self-pay | Admitting: Cardiovascular Disease

## 2022-12-06 DIAGNOSIS — I6523 Occlusion and stenosis of bilateral carotid arteries: Secondary | ICD-10-CM

## 2022-12-06 NOTE — Telephone Encounter (Signed)
Patient called to have orders for her Carotid test reinstated so test can be scheduled.

## 2022-12-06 NOTE — Telephone Encounter (Signed)
Patient notified that a new order placed for carotid ultrasound. Patient verbalizes understanding.

## 2022-12-13 NOTE — Progress Notes (Unsigned)
Date:  12/14/2022   ID:  Yvonne Davidson, DOB April 08, 1940, MRN [id number]  Patient Location:  [address] [place]vonne [place] [address]   Provider location:   Yvonne Davidson, [place] office  PCP:  Yvonne Ivan, MD  Cardiologist:  Yvonne Davidson   Chief Complaint  Patient presents with   12 month follow up     "Doing well." Medications reviewed by the patient verbally.    History of Present Illness:    Yvonne Davidson is a 82 y.o. female  past medical history of PAD, carotid stenosis Hyperlipidemia DM  hypertension,  obstructive sleep apnea who is followed at [facility],  chronic pain in her legs,  GERD,  Anxiety  OSA, uses bipap She presents for follow-up of her blood pressure and carotid disease  Last seen by myself in clinic  November 2023  Carotid ultrasound November 2023 1 to 39% right internal carotid artery stenosis with 60 to 79% left internal carotid artery stenosis.   Wakes frequently History of obstructive sleep apnea, previously tried a mouth piece designed by [employer] . Now uses full face mask, treated on BiPAP Long history of night time spikes in pressure,  Periodically takes third clonidine in the middle of the night, feels better ,then goes back to bed  Denies shortness of breath or chest pain on exertion, walks [place] the track [place] she [place] daily Reports legs are strong  Taking Crestor 2 days a week Saturday Sunday, Zetia daily  Current medication list Clonidine 0.1 mg 8:30 pm  and 10 PM, sometimes overnight Bystolic 10 mg at dinner Olmesrtan 20 mg BID Extra clonidine at times, does not take clonidine in the morning  Got covid July 2022  Lab work reviewed A1C 7 Total chol 163, LDL 90, zetia daily, crestor 2 days a week  EKG personally reviewed by myself on todays visit EKG Interpretation Date/Time:  Tuesday December 14 2022 10:18:14 EST Ventricular Rate:  58 PR  Interval:  150 QRS Duration:  86 QT Interval:  440 QTC Calculation: 431 R Axis:   36  Text Interpretation: Sinus bradycardia When compared with ECG of 29-Jul-2021 00:28, No significant change was found Confirmed by Yvonne Davidson [phone]) on 12/14/2022 10:20:50 AM    Other past medical history reviewed Followed by Dr. Wyn Davidson for renal artery stenosis Underwent renal angiogram April 14, 2020 : Aorta and iliac arteries were relatively normal.  Both renal arteries had calcific plaque with minimal stenosis of less than 25%. The SMA had some calcific plaque and stenosis in the 30% range but nothing significant.  Intolerances:  off amlodipine secondary to leg swelling  Unable to tolerate any diuretics per the patient including HCTZ, Lasix, Aldactone She is cautious about taking extra doses of Bystolic or clonidine concerned about bradycardia but has never had symptoms  She stopped Crestor on her own Previously reported having mold in her attic in the past, felt it was affecting her breathing Had testing done on her house   For  she has chronic aching in her legs. She attributes this to sleep apnea and poor blood supply to her legs   She  retired at the age of 37. She was working in [employer]. She was unable to maintain the pace with such poor sleep on a chronic basis   Past Medical History:  Diagnosis Date   Allergy    Anemia    Anxiety  Arthritis    Bilateral cataracts    immature   Carotid arterial disease (HCC)    a. RICA 40-59%, LICA 60-79%.   Chronic sinusitis    takes Cetirizine daily   Depression    doesn't take any meds   Diabetes mellitus without complication (HCC)    borderline   Diastolic dysfunction    a. 05/2011: EF 60%, trace AI, trace MR, mild TR; b. 10/2016 Echo: EF 60-65%, no rmwa, GrI DD, triv AI, mild MR, mildly dil LA.   Diverticulosis    Dry eyes    GERD (gastroesophageal reflux disease)    TAKES OMEPRAZOLE DAILY   Hashimoto's thyroiditis    History  of blood transfusion    in the 60's no abnormal reaction noted   Hyperlipidemia    a. self discontinued statin   Hypertension    Insomnia    takes Trazodone nightly   Medication intolerance    OSA (obstructive sleep apnea)    doesn't use a cpap   Osteoporosis    takes Vit D as needed   PAD (peripheral artery disease) (HCC)    a. 03/2020 Angio: Relatively nl Ao and iliac arteries. <25% bilat RAS. SMA 30%->Med rx.   PONV (postoperative nausea and vomiting)    hard to wake up   Renal artery stenosis (HCC)    a. 03/2020 Renal angio: <25% bilat RAS.   Unspecified gastritis and gastroduodenitis without mention of hemorrhage 05/17/2012   Past Surgical History:  Procedure Laterality Date   CHOLECYSTECTOMY  12/07/2012   Dr Yvonne Davidson   CHOLECYSTECTOMY N/A 12/07/2012   Procedure: LAPAROSCOPIC CHOLECYSTECTOMY WITH INTRAOPERATIVE CHOLANGIOGRAM;  Surgeon: Yvonne Cocking, MD;  Location: [facility] OR;  Service: General;  Laterality: N/A;   LIPOSUCTION     NASAL SINUS SURGERY  2006   x 2   RENAL ANGIOGRAPHY Right 04/14/2020   Procedure: RENAL ANGIOGRAPHY;  Surgeon: Yvonne Needy, MD;  Location: [facility] INVASIVE CV LAB;  Service: Cardiovascular;  Laterality: Right;   right arm surgery  1960   TCS       Current Meds  Medication Sig   cloNIDine (CATAPRES) 0.1 MG tablet TAKE 1 TABLET BY MOUTH THREE TIMES DAILY   ezetimibe (ZETIA) 10 MG tablet TAKE ONE TABLET BY MOUTH EVERY DAY   isosorbide dinitrate (ISORDIL) 20 MG tablet Take 1 tablet (20 mg total) by mouth 3 (three) times daily as needed (for pressure >160).   metFORMIN (GLUCOPHAGE) 500 MG tablet Take 250 mg by mouth 2 (two) times daily with a meal.   nebivolol (BYSTOLIC) 10 MG tablet TAKE ONE TABLET BY MOUTH EVERY DAY   olmesartan (BENICAR) 40 MG tablet TAKE ONE TABLET BY MOUTH EVERY DAY   rosuvastatin (CRESTOR) 5 MG tablet Take 1 tablet (5 mg total) by mouth daily.     Allergies:   Buprenorphine hcl, Chlorphen-diphenhyd-pe-apap, Codeine,  Hydrochlorothiazide, Indomethacin, Mold extract [trichophyton mentagrophyte], Morphine, Morphine and codeine, Oxycodone, Oxycodone hcl, Oxycodone hcl, Procaine, Sumycin [tetracycline hcl], Tetracycline, Trichophyton, Amlodipine, Brompheniramine-pseudoeph, Cardura [doxazosin mesylate], Chlorphen-phenyleph-asa, Decongest-aid [pseudoephedrine], Esomeprazole magnesium, Esomeprazole magnesium, Hydralazine, Neomycin-bacitracin-polymyxin [bacitracin-neomycin-polymyxin], Prednisone, Procaine hcl, Cephalexin, Latex, Neomycin-bacitracin zn-polymyx, Pseudoephedrine hcl er, Sudafed pe cold & cough child  [phenylephrine-dm], and Telithromycin   Social History   Tobacco Use   Smoking status: Never   Smokeless tobacco: Never   Tobacco comments:    quit in 1978  Vaping Use   Vaping status: Never Used  Substance Use Topics   Alcohol use: No    Alcohol/week: 0.0 standard drinks  of alcohol   Drug use: No     Family Hx: The patient's family history includes Alcohol abuse in her father; Alzheimer's disease in her brother; Aneurysm in her mother; Arthritis in her mother; Breast cancer in her mother; Cancer in her mother; Early death in her father; Heart disease in her mother; Hyperlipidemia in her mother; Hypertension in her mother; Rheum arthritis in her brother; Stroke in her mother.  ROS:   Please see the history of present illness.    Review of Systems  Constitutional: Negative.   HENT: Negative.    Respiratory: Negative.    Cardiovascular: Negative.   Gastrointestinal: Negative.   Musculoskeletal: Negative.   Neurological: Negative.   Psychiatric/Behavioral: Negative.    All other systems reviewed and are negative.    Labs/Other Tests and Data Reviewed:    Recent Labs: No results found for requested labs within last 365 days.   Recent Lipid Panel Lab Results  Component Value Date/Time   CHOL 126 03/03/2015 08:39 AM   TRIG 62 03/03/2015 08:39 AM   HDL 47 03/03/2015 08:39 AM   CHOLHDL 2.7  03/03/2015 08:39 AM   CHOLHDL 6 01/15/2014 08:17 AM   LDLCALC 67 03/03/2015 08:39 AM   LDLDIRECT 156.6 05/17/2012 03:44 PM    Wt Readings from Last 3 Encounters:  12/14/22 149 lb (67.6 kg)  11/29/22 149 lb 12.8 oz (67.9 kg)  08/05/22 146 lb 9.6 oz (66.5 kg)     Exam:    Vital Signs: Vital signs may also be detailed in the HPI BP (!) 140/60 (BP Location: Left Arm, Patient Position: Sitting, Cuff Size: Normal)   Pulse (!) 58   Ht 5\' 2"  (1.575 m)   Wt 149 lb (67.6 kg)   SpO2 98%   BMI 27.25 kg/m   Constitutional:  oriented to person, place, and time. No distress.  HENT:  Head: Grossly normal Eyes:  no discharge. No scleral icterus.  Neck: No JVD, no carotid bruits  Cardiovascular: Regular rate and rhythm, no murmurs appreciated Pulmonary/Chest: Clear to auscultation bilaterally, no wheezes or rails Abdominal: Soft.  no distension.  no tenderness.  Musculoskeletal: Normal range of motion Neurological:  normal muscle tone. Coordination normal. No atrophy Skin: Skin warm and dry Psychiatric: normal affect, pleasant   ASSESSMENT & PLAN:    Bilateral carotid artery stenosis -  U/s nov 2023, 1 to 39% right internal carotid artery stenosis with 60 to 79% left internal carotid artery stenosis  Previously declined statin,   on Zetia 10 daily Reports taking Crestor 5 mg on Saturday and Sunday, no side effects Recommend she try adding additional day during the week for 3 days a week total Repeat ultrasound ordered   Hypertension goal BP (blood pressure) < 140/90 - Currently has a regimen that works for her, occasionally takes extra clonidine overnight   Hyperlipidemia LDL goal <100 - Plan: EKG 95-AOZH on zetia and Crestor 5 two days a week History of statin intolerance Recommend she try adding extra dose of clonidine during the week   OSA on CPAP - Plan: EKG 12-Lead On bipap, followed by [facility]/pulmonary Continues to wake up middle of the night  Anxiety Seems to be doing  well Reports that she walks on the track daily  Arthritis On OTC nmeds    Signed, Yvonne Nordmann, MD  12/14/2022 10:20 AM    [facility] [address], [place], [place] [address]

## 2022-12-14 ENCOUNTER — Encounter: Payer: Self-pay | Admitting: Cardiovascular Disease

## 2022-12-14 ENCOUNTER — Ambulatory Visit: Payer: PPO | Attending: Cardiovascular Disease | Admitting: Cardiovascular Disease

## 2022-12-14 VITALS — BP 140/60 | HR 58 | Ht 62.0 in | Wt 149.0 lb

## 2022-12-14 DIAGNOSIS — I739 Peripheral vascular disease, unspecified: Secondary | ICD-10-CM

## 2022-12-14 DIAGNOSIS — R0602 Shortness of breath: Secondary | ICD-10-CM

## 2022-12-14 DIAGNOSIS — E1169 Type 2 diabetes mellitus with other specified complication: Secondary | ICD-10-CM

## 2022-12-14 DIAGNOSIS — K551 Chronic vascular disorders of intestine: Secondary | ICD-10-CM

## 2022-12-14 DIAGNOSIS — I10 Essential (primary) hypertension: Secondary | ICD-10-CM

## 2022-12-14 DIAGNOSIS — I272 Pulmonary hypertension, unspecified: Secondary | ICD-10-CM

## 2022-12-14 DIAGNOSIS — E782 Mixed hyperlipidemia: Secondary | ICD-10-CM | POA: Diagnosis not present

## 2022-12-14 DIAGNOSIS — I6523 Occlusion and stenosis of bilateral carotid arteries: Secondary | ICD-10-CM | POA: Diagnosis not present

## 2022-12-14 DIAGNOSIS — I774 Celiac artery compression syndrome: Secondary | ICD-10-CM

## 2022-12-14 DIAGNOSIS — E785 Hyperlipidemia, unspecified: Secondary | ICD-10-CM

## 2022-12-14 DIAGNOSIS — I701 Atherosclerosis of renal artery: Secondary | ICD-10-CM

## 2022-12-14 MED ORDER — EZETIMIBE 10 MG PO TABS: 10.0000 mg | ORAL_TABLET | Freq: Every day | ORAL | 3 refills | Status: DC

## 2022-12-14 MED ORDER — NEBIVOLOL HCL 10 MG PO TABS: 10.0000 mg | ORAL_TABLET | Freq: Every day | ORAL | 3 refills | Status: DC

## 2022-12-14 MED ORDER — ISOSORBIDE DINITRATE 20 MG PO TABS: 20.0000 mg | ORAL_TABLET | Freq: Three times a day (TID) | ORAL | 1 refills | Status: DC | PRN

## 2022-12-14 MED ORDER — ROSUVASTATIN CALCIUM 5 MG PO TABS: 5.0000 mg | ORAL_TABLET | Freq: Every day | ORAL | 3 refills | Status: DC

## 2022-12-14 MED ORDER — CLONIDINE HCL 0.1 MG PO TABS: 0.1000 mg | ORAL_TABLET | Freq: Three times a day (TID) | ORAL | 3 refills | Status: DC

## 2022-12-14 MED ORDER — OLMESARTAN MEDOXOMIL 40 MG PO TABS: 40.0000 mg | ORAL_TABLET | Freq: Every day | ORAL | 3 refills | Status: DC

## 2022-12-14 NOTE — Patient Instructions (Signed)

## 2022-12-29 ENCOUNTER — Ambulatory Visit: Payer: HMO | Admitting: Nurse Practitioner

## 2023-01-05 ENCOUNTER — Ambulatory Visit: Payer: PPO | Attending: Nurse Practitioner

## 2023-01-05 DIAGNOSIS — I6523 Occlusion and stenosis of bilateral carotid arteries: Secondary | ICD-10-CM | POA: Diagnosis not present

## 2023-01-11 ENCOUNTER — Ambulatory Visit: Payer: HMO | Admitting: Nurse Practitioner

## 2023-01-11 ENCOUNTER — Telehealth: Payer: Self-pay | Admitting: Nurse Practitioner

## 2023-01-11 NOTE — Telephone Encounter (Signed)
Notified Ashly, Melissa & Terri with [facility] of cpap pressure change order-Toni

## 2023-01-14 ENCOUNTER — Telehealth: Payer: Self-pay | Admitting: Cardiovascular Disease

## 2023-01-14 MED ORDER — ISOSORBIDE DINITRATE 20 MG PO TABS: 20.0000 mg | ORAL_TABLET | Freq: Three times a day (TID) | ORAL | 1 refills | Status: DC | PRN

## 2023-01-14 NOTE — Telephone Encounter (Signed)
Called and spoke with patient.   I have her current regimen as below  Clonidine 0.1 mg 8:30 pm  and 10 PM, sometimes overnight  Bystolic 10 mg at dinner  Olmesrtan 20 mg BID  Extra clonidine at times   She can use isosorbide dinitrate 20 mg as needed for high-pressure over 160  We can given you prescription  --She can also take extra clonidine 0.1 as needed   Can we find out when the blood pressure is running high,  Which she be able to document some numbers for [place] to review  Need to know blood pressure number and the time of day  Thx  TGollan   Patient states that she is currently taking.   Clonidine 0.1 mg 8:30 pm  and 10 PM, sometimes overnight  Bystolic 10 mg at dinner  Olmesrtan 40 mg daily  Patient states that her blood pressure is running high at night time.   Patient states that she will try the Isosorbide Dinitrate as needed and that she will send a log of blood pressure.

## 2023-01-14 NOTE — Telephone Encounter (Signed)
Left a message for the patient to call back.  

## 2023-01-14 NOTE — Telephone Encounter (Signed)
Pt c/o BP issue: STAT if pt c/o blurred vision, one-sided weakness or slurred speech  1. What are your last 5 BP readings? 170/60  2. Are you having any other symptoms (ex. Dizziness, headache, blurred vision, passed out)? headaches  3. What is your BP issue? Hypertension with headaches

## 2023-01-14 NOTE — Telephone Encounter (Signed)
The patient called in requesting an appointment with Dr. Mariah Milling. She stated that she has been having elevated blood pressures. She declined to see anyone but Dr. Mariah Milling and has been advised that he does not have any openings.   Current medications: Clonidine 0.1 mg three times daily Bystolic 10 mg once daily- at dinner Olmesartan 40 mg once daily-in the am  She has not been taking the Isordil 20 mg prn. She said the prescription had run out so she was not sure she should be taking this.   Today: 11 am blood pressure was 177/62 1 pm blood pressure was 169/55  She has been advised that if she is having symptomatic high blood pressures over the weekend that she can call the on call but she declined stating that she only wanted Dr. Windell Hummingbird recommendations.

## 2023-01-14 NOTE — Telephone Encounter (Signed)
Patient returned RN Lisa's call.

## 2023-01-17 ENCOUNTER — Telehealth: Payer: Self-pay | Admitting: Nurse Practitioner

## 2023-01-17 NOTE — Telephone Encounter (Signed)
Per [facility], pressure change to 15 cm H20 completed-Toni

## 2023-02-07 ENCOUNTER — Telehealth: Payer: Self-pay | Admitting: Nurse Practitioner

## 2023-02-07 ENCOUNTER — Encounter: Payer: Self-pay | Admitting: Nurse Practitioner

## 2023-02-07 ENCOUNTER — Ambulatory Visit: Payer: HMO | Admitting: Nurse Practitioner

## 2023-02-07 VITALS — BP 150/80 | HR 60 | Temp 98.6°F | Resp 16 | Ht 62.0 in | Wt 154.8 lb

## 2023-02-07 DIAGNOSIS — G4719 Other hypersomnia: Secondary | ICD-10-CM | POA: Diagnosis not present

## 2023-02-07 DIAGNOSIS — I10 Essential (primary) hypertension: Secondary | ICD-10-CM | POA: Diagnosis not present

## 2023-02-07 DIAGNOSIS — G4733 Obstructive sleep apnea (adult) (pediatric): Secondary | ICD-10-CM | POA: Diagnosis not present

## 2023-02-07 NOTE — Progress Notes (Signed)
 [facility] [address] [place], [place] [address]  Internal MEDICINE  Office Visit Note  Patient Name: Yvonne Davidson  [id number]  [id number]  Date of Service: 02/07/2023  Chief Complaint  Patient presents with   Follow-up    HPI Yvonne Davidson presents for a follow-up visit for OSA on CPAP --currently on 15 cmH2O, was increased by 2 cmH2O at her last office visit, feels like she cannot breathe still. BP has still been elevated and she has increased anxiety about her sleep. AHI on her CPAP download is low at 1.8 events/hr but patient states she still feels like she cannot breathe.  --patient refuses to do a repeat CPAP titration -- strong Family history of OSA     Current Medication: Outpatient Encounter Medications as of 02/07/2023  Medication Sig   B-D ULTRA-FINE 33 LANCETS MISC Use as directed. Dx 250.0   Blood Glucose Monitoring Suppl (ONE TOUCH ULTRA MINI) W/DEVICE KIT USE AS DIRECTED   cloNIDine  (CATAPRES ) 0.1 MG tablet Take 1 tablet (0.1 mg total) by mouth 3 (three) times daily.   ezetimibe  (ZETIA ) 10 MG tablet Take 1 tablet (10 mg total) by mouth daily.   isosorbide  dinitrate (ISORDIL ) 20 MG tablet Take 1 tablet (20 mg total) by mouth 3 (three) times daily as needed (for pressure >160).   metFORMIN  (GLUCOPHAGE ) 500 MG tablet Take 250 mg by mouth 2 (two) times daily with a meal.   nebivolol  (BYSTOLIC ) 10 MG tablet Take 1 tablet (10 mg total) by mouth daily.   olmesartan  (BENICAR ) 40 MG tablet Take 1 tablet (40 mg total) by mouth daily.   ONETOUCH DELICA LANCETS 33G MISC Use as directed. Dx 250.0   rosuvastatin  (CRESTOR ) 5 MG tablet Take 1 tablet (5 mg total) by mouth daily.   No facility-administered encounter medications on file as of 02/07/2023.    Surgical History: Past Surgical History:  Procedure Laterality Date   CHOLECYSTECTOMY  12/07/2012   Dr Ethyl   CHOLECYSTECTOMY N/A 12/07/2012   Procedure: LAPAROSCOPIC CHOLECYSTECTOMY WITH INTRAOPERATIVE  CHOLANGIOGRAM;  Surgeon: Alm VEAR Ethyl, MD;  Location: [facility] OR;  Service: General;  Laterality: N/A;   LIPOSUCTION     NASAL SINUS SURGERY  2006   x 2   RENAL ANGIOGRAPHY Right 04/14/2020   Procedure: RENAL ANGIOGRAPHY;  Surgeon: Marea Selinda RAMAN, MD;  Location: [facility] INVASIVE CV LAB;  Service: Cardiovascular;  Laterality: Right;   right arm surgery  1960   TCS      Medical History: Past Medical History:  Diagnosis Date   Allergy    Anemia    Anxiety    Arthritis    Bilateral cataracts    immature   Carotid arterial disease (HCC)    a. RICA 40-59%, LICA 60-79%.   Chronic sinusitis    takes Cetirizine daily   Depression    doesn't take any meds   Diabetes mellitus without complication (HCC)    borderline   Diastolic dysfunction    a. 05/2011: EF 60%, trace AI, trace MR, mild TR; b. 10/2016 Echo: EF 60-65%, no rmwa, GrI DD, triv AI, mild MR, mildly dil LA.   Diverticulosis    Dry eyes    GERD (gastroesophageal reflux disease)    TAKES OMEPRAZOLE  DAILY   Hashimoto's thyroiditis    History of blood transfusion    in the 60's no abnormal reaction noted   Hyperlipidemia    a. self discontinued statin   Hypertension    Insomnia    takes  Trazodone  nightly   Medication intolerance    OSA (obstructive sleep apnea)    doesn't use a cpap   Osteoporosis    takes Vit D as needed   PAD (peripheral artery disease) (HCC)    a. 03/2020 Angio: Relatively nl Ao and iliac arteries. <25% bilat RAS. SMA 30%->Med rx.   PONV (postoperative nausea and vomiting)    hard to wake up   Renal artery stenosis (HCC)    a. 03/2020 Renal angio: <25% bilat RAS.   Unspecified gastritis and gastroduodenitis without mention of hemorrhage 05/17/2012    Family History: Family History  Problem Relation Age of Onset   Arthritis Mother    Heart disease Mother    Hyperlipidemia Mother    Hypertension Mother    Stroke Mother    Cancer Mother        Breast & Uterine - 20'-30's   Aneurysm Mother    Breast  cancer Mother    Early death Father        Fire   Alcohol  abuse Father    Alzheimer's disease Brother    Rheum arthritis Brother     Social History   Socioeconomic History   Marital status: Married    Spouse name: Not on file   Number of children: 4   Years of education: Not on file   Highest education level: Not on file  Occupational History   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 Has 4 children.   Social Drivers of Corporate Investment Banker Strain: Low Risk  (08/09/2022)   Received from [facility]   Overall Financial Resource Strain (CARDIA)    Difficulty of Paying Living Expenses: Not hard at all  Food Insecurity: No Food Insecurity (08/09/2022)   Received from [facility]   Hunger Vital Sign    Worried About Running Out of Food in the Last Year: Never true    Ran Out of Food in the Last Year: Never true  Transportation Needs: No Transportation Needs (08/09/2022)   Received [facility] - Transportation    In the past 12 months, has lack of transportation kept you from medical appointments or from getting medications?: No    Lack of Transportation (Non-Medical): No  Physical Activity: Not on file  Stress: Not on file  Social Connections: Not on file  Intimate Partner Violence: Not on file      Review of Systems  Constitutional:  Positive for fatigue.  Respiratory:  Positive for apnea (episodes of apnea at night) and choking. Negative for shortness of breath and stridor.   Cardiovascular: Negative.  Negative for chest pain and  palpitations.  Musculoskeletal: Negative.   Psychiatric/Behavioral:  Positive for sleep disturbance. Negative for behavioral problems, self-injury and suicidal ideas. The patient is not nervous/anxious.     Vital Signs: BP (!) 150/80   Pulse 60   Temp 98.6 F (37 C)   Resp 16   Ht 5' 2 (1.575 m)  Wt 154 lb 12.8 oz (70.2 kg)   SpO2 97%   BMI 28.31 kg/m    Physical Exam Vitals reviewed.  Constitutional:      General: She is not in acute distress.    Appearance: Normal appearance. She is not ill-appearing.  HENT:     Head: Normocephalic and atraumatic.  Eyes:     Pupils: Pupils are equal, round, and reactive to light.  Cardiovascular:     Rate and Rhythm: Normal rate and regular rhythm.  Pulmonary:     Effort: Pulmonary effort is normal. No respiratory distress.  Neurological:     Mental Status: She is alert and oriented to person, place, and time.  Psychiatric:        Mood and Affect: Mood normal.        Behavior: Behavior normal.       Assessment/Plan: 1. OSA (obstructive sleep apnea) (Primary) CPAP pressure setting increased to 17 cmH2O, follow up with DSK in 3 weeks to determine if this increase is enough or if she needs to be changed over to auto PAP.  - For home use only DME continuous positive airway pressure (CPAP)  2. Excessive daytime sleepiness CPAP pressure setting increased to 17 cmH2O, follow up with DSK in 3 weeks to determine if this increase is enough or if she needs to be changed over to auto PAP.  - For home use only DME continuous positive airway pressure (CPAP)  3. Essential hypertension CPAP pressure setting increased to 17 cmH2O, follow up with DSK in 3 weeks to determine if this increase is enough or if she needs to be changed over to auto PAP. Also patient needs to follow up with PCP regarding her elevated blood pressure to determine if this increase in her blood pressure could be related to something other than her OSA and CPAP.  - For home  use only DME continuous positive airway pressure (CPAP)   General Counseling: Collyn verbalizes understanding of the findings of todays visit and agrees with plan of treatment. I have discussed any further diagnostic evaluation that may be needed or ordered today. We also reviewed her medications today. she has been encouraged to call the office with any questions or concerns that should arise related to todays visit.    Orders Placed This Encounter  Procedures   For home use only DME continuous positive airway pressure (CPAP)    No orders of the defined types were placed in this encounter.   Return in about 3 weeks (around 02/28/2023) for please schedule patient for f/u WITH DSK to review CPAP pressure settings. .   Total time spent:30 Minutes Time spent includes review of chart, medications, test results, and follow up plan with the patient.   [place] Controlled Substance Database was reviewed by me.  This patient was seen by Mardy Maxin, FNP-C in collaboration with Dr. Sigrid Bathe as a part of collaborative care agreement.   Jamiyla Ishee R. Maxin, MSN, FNP-C Internal medicine

## 2023-02-07 NOTE — Telephone Encounter (Signed)
 Notified Ashly, Melissa & Terri with [facility] of cpap pressure change order-Toni

## 2023-02-08 ENCOUNTER — Encounter: Payer: Self-pay | Admitting: Nurse Practitioner

## 2023-02-09 DIAGNOSIS — E782 Mixed hyperlipidemia: Secondary | ICD-10-CM | POA: Diagnosis not present

## 2023-02-09 DIAGNOSIS — E538 Deficiency of other specified B group vitamins: Secondary | ICD-10-CM | POA: Diagnosis not present

## 2023-02-11 DIAGNOSIS — E1159 Type 2 diabetes mellitus with other circulatory complications: Secondary | ICD-10-CM | POA: Diagnosis not present

## 2023-02-11 DIAGNOSIS — E782 Mixed hyperlipidemia: Secondary | ICD-10-CM | POA: Diagnosis not present

## 2023-02-11 DIAGNOSIS — G4733 Obstructive sleep apnea (adult) (pediatric): Secondary | ICD-10-CM | POA: Diagnosis not present

## 2023-02-11 DIAGNOSIS — E538 Deficiency of other specified B group vitamins: Secondary | ICD-10-CM | POA: Diagnosis not present

## 2023-02-11 DIAGNOSIS — I10 Essential (primary) hypertension: Secondary | ICD-10-CM | POA: Diagnosis not present

## 2023-02-15 ENCOUNTER — Telehealth: Payer: Self-pay | Admitting: Internal Medicine

## 2023-02-15 NOTE — Telephone Encounter (Signed)
Patient called stating she has not heard from [facility] about pressure adjustment. I s/w Robby Sermon, she will give order to [place]. He will go into system today to change. I notified patient-Yvonne Davidson

## 2023-02-16 ENCOUNTER — Other Ambulatory Visit: Payer: Self-pay | Admitting: Family Medicine

## 2023-02-16 ENCOUNTER — Encounter: Payer: Self-pay | Admitting: Family Medicine

## 2023-02-16 DIAGNOSIS — N644 Mastodynia: Secondary | ICD-10-CM

## 2023-02-17 DIAGNOSIS — G4733 Obstructive sleep apnea (adult) (pediatric): Secondary | ICD-10-CM | POA: Diagnosis not present

## 2023-02-22 ENCOUNTER — Ambulatory Visit
Admission: RE | Admit: 2023-02-22 | Discharge: 2023-02-22 | Disposition: A | Discharge status: Home / Self Care | Payer: PPO | Source: Ambulatory Visit | Attending: Family Medicine | Admitting: Family Medicine

## 2023-02-22 DIAGNOSIS — N644 Mastodynia: Secondary | ICD-10-CM | POA: Diagnosis not present

## 2023-02-22 DIAGNOSIS — R92323 Mammographic fibroglandular density, bilateral breasts: Secondary | ICD-10-CM | POA: Diagnosis not present

## 2023-02-28 ENCOUNTER — Ambulatory Visit (INDEPENDENT_AMBULATORY_CARE_PROVIDER_SITE_OTHER): Payer: HMO | Admitting: Internal Medicine

## 2023-02-28 ENCOUNTER — Encounter: Payer: Self-pay | Admitting: Internal Medicine

## 2023-02-28 VITALS — BP 138/75 | HR 55 | Temp 98.2°F | Resp 16 | Ht 62.0 in | Wt 151.0 lb

## 2023-02-28 DIAGNOSIS — Z7189 Other specified counseling: Secondary | ICD-10-CM

## 2023-02-28 DIAGNOSIS — G4733 Obstructive sleep apnea (adult) (pediatric): Secondary | ICD-10-CM
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
## 2023-02-28 NOTE — Patient Instructions (Signed)

## 2023-03-19 DIAGNOSIS — G4733 Obstructive sleep apnea (adult) (pediatric): Secondary | ICD-10-CM | POA: Diagnosis not present

## 2023-03-21 DIAGNOSIS — H43813 Vitreous degeneration, bilateral: Secondary | ICD-10-CM | POA: Diagnosis not present

## 2023-03-21 DIAGNOSIS — Z961 Presence of intraocular lens: Secondary | ICD-10-CM | POA: Diagnosis not present

## 2023-03-21 DIAGNOSIS — E119 Type 2 diabetes mellitus without complications: Secondary | ICD-10-CM | POA: Diagnosis not present

## 2023-03-21 DIAGNOSIS — M3501 Sicca syndrome with keratoconjunctivitis: Secondary | ICD-10-CM | POA: Diagnosis not present

## 2023-03-28 DIAGNOSIS — J40 Bronchitis, not specified as acute or chronic: Secondary | ICD-10-CM | POA: Diagnosis not present

## 2023-03-28 DIAGNOSIS — Z03818 Encounter for observation for suspected exposure to other biological agents ruled out: Secondary | ICD-10-CM | POA: Diagnosis not present

## 2023-03-28 DIAGNOSIS — J111 Influenza due to unidentified influenza virus with other respiratory manifestations: Secondary | ICD-10-CM | POA: Diagnosis not present

## 2023-04-07 ENCOUNTER — Emergency Department
Admission: EM | Admit: 2023-04-07 | Discharge: 2023-04-07 | Disposition: A | Discharge status: Home / Self Care | Attending: Emergency Medicine | Admitting: Emergency Medicine

## 2023-04-07 ENCOUNTER — Emergency Department

## 2023-04-07 ENCOUNTER — Other Ambulatory Visit: Payer: Self-pay

## 2023-04-07 ENCOUNTER — Encounter: Payer: Self-pay | Admitting: Intensive Care

## 2023-04-07 DIAGNOSIS — J40 Bronchitis, not specified as acute or chronic: Secondary | ICD-10-CM | POA: Diagnosis not present

## 2023-04-07 DIAGNOSIS — R059 Cough, unspecified: Secondary | ICD-10-CM | POA: Diagnosis not present

## 2023-04-07 LAB — COMPREHENSIVE METABOLIC PANEL
ALT: 21 U/L (ref 0–44)
AST: 22 U/L (ref 15–41)
Albumin: 3.6 g/dL (ref 3.5–5.0)
Alkaline Phosphatase: 50 U/L (ref 38–126)
Anion gap: 6 (ref 5–15)
BUN: 22 mg/dL (ref 8–23)
CO2: 25 mmol/L (ref 22–32)
Calcium: 9.5 mg/dL (ref 8.9–10.3)
Chloride: 104 mmol/L (ref 98–111)
Creatinine, Ser: 0.88 mg/dL (ref 0.44–1.00)
GFR, Estimated: >60 mL/min (ref >60)
Glucose, Bld: 138 mg/dL — ABNORMAL HIGH (ref 70–99)
Potassium: 4.3 mmol/L (ref 3.5–5.1)
Sodium: 135 mmol/L (ref 135–145)
Total Bilirubin: 0.7 mg/dL (ref 0.0–1.2)
Total Protein: 6.8 g/dL (ref 6.5–8.1)

## 2023-04-07 LAB — CBC
HCT: 36.3 % (ref 36.0–46.0)
Hemoglobin: 11.8 g/dL — ABNORMAL LOW (ref 12.0–15.0)
MCH: 29.6 pg (ref 26.0–34.0)
MCHC: 32.5 g/dL (ref 30.0–36.0)
MCV: 91.2 fL (ref 80.0–100.0)
Platelets: UNDETERMINED 10*3/uL (ref 150–400)
RBC: 3.98 MIL/uL (ref 3.87–5.11)
RDW: 13.2 % (ref 11.5–15.5)
WBC: 6.2 10*3/uL (ref 4.0–10.5)
nRBC: 0 % (ref 0.0–0.2)

## 2023-04-07 LAB — LIPASE, BLOOD: Lipase: 55 U/L — ABNORMAL HIGH (ref 11–51)

## 2023-04-07 MED ORDER — PREDNISONE 20 MG PO TABS: 40.0000 mg | ORAL_TABLET | Freq: Every day | ORAL | 0 refills | Status: AC

## 2023-04-07 MED ORDER — ALBUTEROL SULFATE HFA 108 (90 BASE) MCG/ACT IN AERS: 2.0000 | INHALATION_SPRAY | RESPIRATORY_TRACT | 0 refills | Status: AC | PRN

## 2023-04-07 MED ORDER — AZITHROMYCIN 250 MG PO TABS: ORAL_TABLET | ORAL | 0 refills | Status: AC

## 2023-04-07 NOTE — Discharge Instructions (Signed)
 Use the albuterol inhaler every 4-6 hours as needed for any shortness of breath, chest tightness, or cough.  You can continue to take the Mucinex.  We have also prescribed a 5-day course of prednisone as well as a 5-day course of antibiotics.  If you find that you cannot tolerate either of these medications, you are okay to discontinue them.  Follow-up with your primary care doctor within the next 1 to 2 weeks.  Return to the ER for new, worsening, or persistent severe cough, shortness of breath, chest pain, fever, weakness, or any other new or worsening symptoms that concern you.

## 2023-04-07 NOTE — ED Triage Notes (Signed)
 Patient c/o cough and diarrhea 2-3 weeks. Seen at [facility] and given antibiotics but quit taking them due to emesis.

## 2023-04-07 NOTE — ED Provider Notes (Signed)
 [facility] Provider Note    Event Date/Time   First MD Initiated Contact with Patient 04/07/23 [phone]     (approximate)   History   Cough   HPI  MALEEYA PETERKIN is a 83 y.o. female with a history of CAD, diastolic dysfunction, hypertension, hyperlipidemia, and peripheral artery disease who presents with cough for the last 2 weeks, persistent course, nonproductive, associated with some shortness of breath.  The patient denies any chest pain.  She has no fever or chills.  She does report persistent diarrhea over the same time.  But no vomiting.  The patient was diagnosed with the flu on 3/3 and started on clarithromycin for bronchitis.  She has also been taking Mucinex with some relief.  I reviewed the past medical records.  The patient's most recent outpatient encounter was with internal medicine on 3/3 at that time reporting 1 week of cough and URI symptoms.  She was positive for influenza A at that time.  She was also started on clarithromycin for acute bronchitis.     Physical Exam   Triage Vital Signs: ED Triage Vitals  Encounter Vitals Group     BP 04/07/23 0910 139/76     Systolic BP Percentile --      Diastolic BP Percentile --      Pulse Rate 04/07/23 0910 (!) 59     Resp 04/07/23 0910 18     Temp 04/07/23 0910 97.9 F (36.6 C)     Temp Source 04/07/23 0910 Oral     SpO2 04/07/23 0910 100 %     Weight 04/07/23 0911 138 lb (62.6 kg)     Height 04/07/23 0911 5\' 2"  (1.575 m)     Head Circumference --      Peak Flow --      Pain Score 04/07/23 0910 4     Pain Loc --      Pain Education --      Exclude from Growth Chart --     Most recent vital signs: Vitals:   04/07/23 0910  BP: 139/76  Pulse: (!) 59  Resp: 18  Temp: 97.9 F (36.6 C)  SpO2: 100%     General: Alert, well-appearing, no distress.  CV:  Good peripheral perfusion.  Resp:  Normal effort.  Lungs CTAB. Abd:  No distention.  Other:  No peripheral edema.   ED Results  / Procedures / Treatments   Labs (all labs ordered are listed, but only abnormal results are displayed) Labs Reviewed  LIPASE, BLOOD - Abnormal; Notable for the following components:      Result Value   Lipase 55 (*)    All other components within normal limits  COMPREHENSIVE METABOLIC PANEL - Abnormal; Notable for the following components:   Glucose, Bld 138 (*)    All other components within normal limits  CBC - Abnormal; Notable for the following components:   Hemoglobin 11.8 (*)    All other components within normal limits     EKG     RADIOLOGY  Chest x-ray: I independently viewed and interpreted the images; there is no focal consolidation or edema   PROCEDURES:  Critical Care performed: No  Procedures   MEDICATIONS ORDERED IN ED: Medications - No data to display   IMPRESSION / MDM / ASSESSMENT AND PLAN / ED COURSE  I reviewed the triage vital signs and the nursing notes.  Differential diagnosis includes, but is not limited to, persistent cough related to the  flu, acute bronchitis, bacterial pneumonia, and less likely edema or other cardiac related cause.  We will obtain chest x-ray, labs, and reassess.  Patient's presentation is most consistent with acute complicated illness / injury requiring diagnostic workup.  ----------------------------------------- 11:59 AM on 04/07/2023 -----------------------------------------  Chest x-ray is clear.  CBC shows no leukocytosis.  Lipase is minimally elevated but this appears chronic for the patient and is not consistent with her current symptoms.  Overall presentation is consistent with bronchitis occurring after the flu.  I think it would be reasonable given the duration of her symptoms to do another antibiotic course.  The patient was not able to tolerate the clarithromycin so we will give azithromycin instead as well as prednisone (the patient reported hypertension with this but I have given a low-dose for only 5  days) and albuterol.  The patient is in agreement with this plan.  She will follow-up with her primary care provider.  I gave strict return precautions and she expressed understanding.   FINAL CLINICAL IMPRESSION(S) / ED DIAGNOSES   Final diagnoses:  Bronchitis     Rx / DC Orders   ED Discharge Orders          Ordered    azithromycin (ZITHROMAX Z-PAK) 250 MG tablet        04/07/23 1158    predniSONE (DELTASONE) 20 MG tablet  Daily        04/07/23 1158    albuterol (VENTOLIN HFA) 108 (90 Base) MCG/ACT inhaler  Every 4 hours PRN        04/07/23 1158             Note:  This document was prepared using Dragon voice recognition software and may include unintentional dictation errors.    Dionne Bucy, MD 04/07/23 1200

## 2023-04-13 DIAGNOSIS — Z8709 Personal history of other diseases of the respiratory system: Secondary | ICD-10-CM | POA: Diagnosis not present

## 2023-04-13 DIAGNOSIS — G4733 Obstructive sleep apnea (adult) (pediatric): Secondary | ICD-10-CM | POA: Diagnosis not present

## 2023-04-18 DIAGNOSIS — G4733 Obstructive sleep apnea (adult) (pediatric): Secondary | ICD-10-CM | POA: Diagnosis not present

## 2023-05-02 ENCOUNTER — Telehealth: Payer: Self-pay | Admitting: Internal Medicine

## 2023-05-02 NOTE — Telephone Encounter (Signed)
 Patient called request pap pressure be reduced back down to 15. She stated she has lost weight and pressure is now too high. She is swallowing air and stomach is distended. This has been going on for about a week. DSK approved pressure decrease. Sent staff message to Alyssa to do order-Toni

## 2023-05-03 ENCOUNTER — Other Ambulatory Visit: Payer: Self-pay | Admitting: Nurse Practitioner

## 2023-05-03 ENCOUNTER — Telehealth: Payer: Self-pay | Admitting: Internal Medicine

## 2023-05-03 DIAGNOSIS — G4733 Obstructive sleep apnea (adult) (pediatric): Secondary | ICD-10-CM

## 2023-05-03 NOTE — Telephone Encounter (Signed)
 Notified Ashly, Melissa & Terri with [employer] of pressure reduction order. Notified patient-Yvonne Davidson

## 2023-05-06 DIAGNOSIS — Q66222 Congenital metatarsus adductus, left foot: Secondary | ICD-10-CM | POA: Diagnosis not present

## 2023-05-06 DIAGNOSIS — Q66221 Congenital metatarsus adductus, right foot: Secondary | ICD-10-CM | POA: Diagnosis not present

## 2023-05-06 DIAGNOSIS — Q667 Congenital pes cavus, unspecified foot: Secondary | ICD-10-CM | POA: Diagnosis not present

## 2023-05-06 DIAGNOSIS — M79671 Pain in right foot: Secondary | ICD-10-CM | POA: Diagnosis not present

## 2023-05-06 DIAGNOSIS — E119 Type 2 diabetes mellitus without complications: Secondary | ICD-10-CM | POA: Diagnosis not present

## 2023-05-06 DIAGNOSIS — M76811 Anterior tibial syndrome, right leg: Secondary | ICD-10-CM | POA: Diagnosis not present

## 2023-05-06 DIAGNOSIS — M79672 Pain in left foot: Secondary | ICD-10-CM | POA: Diagnosis not present

## 2023-05-23 DIAGNOSIS — E1165 Type 2 diabetes mellitus with hyperglycemia: Secondary | ICD-10-CM | POA: Diagnosis not present

## 2023-05-23 DIAGNOSIS — E038 Other specified hypothyroidism: Secondary | ICD-10-CM | POA: Diagnosis not present

## 2023-06-01 DIAGNOSIS — R0600 Dyspnea, unspecified: Secondary | ICD-10-CM | POA: Diagnosis not present

## 2023-06-01 DIAGNOSIS — G4733 Obstructive sleep apnea (adult) (pediatric): Secondary | ICD-10-CM | POA: Diagnosis not present

## 2023-06-01 DIAGNOSIS — R0683 Snoring: Secondary | ICD-10-CM | POA: Diagnosis not present

## 2023-06-02 DIAGNOSIS — G4733 Obstructive sleep apnea (adult) (pediatric): Secondary | ICD-10-CM | POA: Diagnosis not present

## 2023-06-03 DIAGNOSIS — M79672 Pain in left foot: Secondary | ICD-10-CM | POA: Diagnosis not present

## 2023-06-03 DIAGNOSIS — M79671 Pain in right foot: Secondary | ICD-10-CM | POA: Diagnosis not present

## 2023-06-08 DIAGNOSIS — M79672 Pain in left foot: Secondary | ICD-10-CM | POA: Diagnosis not present

## 2023-06-08 DIAGNOSIS — M79671 Pain in right foot: Secondary | ICD-10-CM | POA: Diagnosis not present

## 2023-06-10 DIAGNOSIS — M79672 Pain in left foot: Secondary | ICD-10-CM | POA: Diagnosis not present

## 2023-06-10 DIAGNOSIS — M79671 Pain in right foot: Secondary | ICD-10-CM | POA: Diagnosis not present

## 2023-06-22 DIAGNOSIS — M79671 Pain in right foot: Secondary | ICD-10-CM | POA: Diagnosis not present

## 2023-06-22 DIAGNOSIS — M79672 Pain in left foot: Secondary | ICD-10-CM | POA: Diagnosis not present

## 2023-06-23 DIAGNOSIS — G4733 Obstructive sleep apnea (adult) (pediatric): Secondary | ICD-10-CM | POA: Diagnosis not present

## 2023-06-29 DIAGNOSIS — M79671 Pain in right foot: Secondary | ICD-10-CM | POA: Diagnosis not present

## 2023-06-29 DIAGNOSIS — M79672 Pain in left foot: Secondary | ICD-10-CM | POA: Diagnosis not present

## 2023-07-07 DIAGNOSIS — M79671 Pain in right foot: Secondary | ICD-10-CM | POA: Diagnosis not present

## 2023-07-07 DIAGNOSIS — M79672 Pain in left foot: Secondary | ICD-10-CM | POA: Diagnosis not present

## 2023-07-13 DIAGNOSIS — M79671 Pain in right foot: Secondary | ICD-10-CM | POA: Diagnosis not present

## 2023-07-13 DIAGNOSIS — M79672 Pain in left foot: Secondary | ICD-10-CM | POA: Diagnosis not present

## 2023-08-04 DIAGNOSIS — E538 Deficiency of other specified B group vitamins: Secondary | ICD-10-CM | POA: Diagnosis not present

## 2023-08-04 DIAGNOSIS — E038 Other specified hypothyroidism: Secondary | ICD-10-CM | POA: Diagnosis not present

## 2023-08-04 DIAGNOSIS — E782 Mixed hyperlipidemia: Secondary | ICD-10-CM | POA: Diagnosis not present

## 2023-08-04 DIAGNOSIS — I10 Essential (primary) hypertension: Secondary | ICD-10-CM | POA: Diagnosis not present

## 2023-08-11 DIAGNOSIS — Z0000 Encounter for general adult medical examination without abnormal findings: Secondary | ICD-10-CM | POA: Diagnosis not present

## 2023-08-11 DIAGNOSIS — Z1331 Encounter for screening for depression: Secondary | ICD-10-CM | POA: Diagnosis not present

## 2023-08-11 DIAGNOSIS — I10 Essential (primary) hypertension: Secondary | ICD-10-CM | POA: Diagnosis not present

## 2023-08-11 DIAGNOSIS — E782 Mixed hyperlipidemia: Secondary | ICD-10-CM | POA: Diagnosis not present

## 2023-08-16 DIAGNOSIS — G4733 Obstructive sleep apnea (adult) (pediatric): Secondary | ICD-10-CM | POA: Diagnosis not present

## 2023-08-18 DIAGNOSIS — I10 Essential (primary) hypertension: Secondary | ICD-10-CM | POA: Diagnosis not present

## 2023-08-18 DIAGNOSIS — E1169 Type 2 diabetes mellitus with other specified complication: Secondary | ICD-10-CM | POA: Diagnosis not present

## 2023-08-18 DIAGNOSIS — M25561 Pain in right knee: Secondary | ICD-10-CM | POA: Diagnosis not present

## 2023-08-18 DIAGNOSIS — G8929 Other chronic pain: Secondary | ICD-10-CM | POA: Diagnosis not present

## 2023-08-18 DIAGNOSIS — E785 Hyperlipidemia, unspecified: Secondary | ICD-10-CM | POA: Diagnosis not present

## 2023-08-18 DIAGNOSIS — E038 Other specified hypothyroidism: Secondary | ICD-10-CM | POA: Diagnosis not present

## 2023-08-18 DIAGNOSIS — M25562 Pain in left knee: Secondary | ICD-10-CM | POA: Diagnosis not present

## 2023-09-12 DIAGNOSIS — E038 Other specified hypothyroidism: Secondary | ICD-10-CM | POA: Diagnosis not present

## 2023-09-12 DIAGNOSIS — E1165 Type 2 diabetes mellitus with hyperglycemia: Secondary | ICD-10-CM | POA: Diagnosis not present

## 2023-09-21 DIAGNOSIS — G4733 Obstructive sleep apnea (adult) (pediatric): Secondary | ICD-10-CM | POA: Diagnosis not present

## 2023-10-10 DIAGNOSIS — Q667 Congenital pes cavus, unspecified foot: Secondary | ICD-10-CM | POA: Diagnosis not present

## 2023-10-10 DIAGNOSIS — M79672 Pain in left foot: Secondary | ICD-10-CM | POA: Diagnosis not present

## 2023-10-10 DIAGNOSIS — Q66221 Congenital metatarsus adductus, right foot: Secondary | ICD-10-CM | POA: Diagnosis not present

## 2023-10-10 DIAGNOSIS — M76811 Anterior tibial syndrome, right leg: Secondary | ICD-10-CM | POA: Diagnosis not present

## 2023-10-10 DIAGNOSIS — E119 Type 2 diabetes mellitus without complications: Secondary | ICD-10-CM | POA: Diagnosis not present

## 2023-10-10 DIAGNOSIS — Q66222 Congenital metatarsus adductus, left foot: Secondary | ICD-10-CM | POA: Diagnosis not present

## 2023-10-10 DIAGNOSIS — M79671 Pain in right foot: Secondary | ICD-10-CM | POA: Diagnosis not present

## 2023-10-18 DIAGNOSIS — J301 Allergic rhinitis due to pollen: Secondary | ICD-10-CM | POA: Diagnosis not present

## 2023-10-18 DIAGNOSIS — G4733 Obstructive sleep apnea (adult) (pediatric): Secondary | ICD-10-CM | POA: Diagnosis not present

## 2023-10-18 DIAGNOSIS — Z1331 Encounter for screening for depression: Secondary | ICD-10-CM | POA: Diagnosis not present

## 2023-10-18 DIAGNOSIS — Z2821 Immunization not carried out because of patient refusal: Secondary | ICD-10-CM | POA: Diagnosis not present

## 2023-10-19 NOTE — Progress Notes (Addendum)
 Yvonne Davidson                                          MRN: [id number]   10/19/2023   The VBCI Quality Team Specialist reviewed this patient medical record for the purposes of chart review for care gap closure. The following were reviewed: chart review for care gap closure-controlling blood pressure. Sent to office contact at [facility] clinic! VBCI Quality Team   02/01/2024- no uacr

## 2023-10-20 DIAGNOSIS — E038 Other specified hypothyroidism: Secondary | ICD-10-CM | POA: Diagnosis not present

## 2023-11-05 ENCOUNTER — Other Ambulatory Visit: Payer: Self-pay | Admitting: Cardiovascular Disease

## 2023-11-07 DIAGNOSIS — G4733 Obstructive sleep apnea (adult) (pediatric): Secondary | ICD-10-CM | POA: Diagnosis not present

## 2023-11-11 ENCOUNTER — Telehealth: Payer: Self-pay | Admitting: Cardiovascular Disease

## 2023-11-11 NOTE — Telephone Encounter (Signed)
 Pt c/o BP issue: STAT if pt c/o blurred vision, one-sided weakness or slurred speech.  STAT if BP is GREATER than 180/120 TODAY.  STAT if BP is LESS than 90/60 and SYMPTOMATIC TODAY  1. What is your BP concern? Hypertension at night time   2. Have you taken any BP medication today?Yes   3. What are your last 5 BP readings? 153/50 this morning, 189/70 last night, averaging 160/50-60 during daytime    4. Are you having any other symptoms (ex. Dizziness, headache, blurred vision, passed out)? Headaches    Pt states her blood pressure spikes at night. The severe headaches that come along with it are very concerning for her. Pt states if she had a stroke, no one would know to check on her. She would be very appreciative of a call back. Please advise.

## 2023-11-11 NOTE — Telephone Encounter (Signed)
 Called and spoke with patient. Patient reports that over the past two weeks her blood pressure has been elevated especially at night. Patient reports a blood pressure of 189/71 at 2:30 AM. Patient says that she has an appointment with Dr. Gollan in November but would like to see him sooner. Patient scheduled to see Dr. Gollan in clinic on 11/14/23. Patient states that she has only taken a half tablet of her PRN Isosorbide  since her blood pressure has been elevated. Patient encouraged to take Isosorbide  as needed and to bring a log of blood pressures to up coming appointment. Patient verbalizes understanding.

## 2023-11-12 NOTE — Progress Notes (Unsigned)
 Date:  11/14/2023   ID:  Yvonne Davidson, DOB [birth year], MRN [id number]  Patient Location:  [address]  [place] [address]   Provider location:   [facility], [place] office  PCP:  Alla Amis, MD  Cardiologist:  Perla [facility]   Chief Complaint  Patient presents with   Hypertension    Patient c/o elevated blood pressure for the past couple of weeks. Denies chest pain or shortness of breath.    History of Present Illness:    Yvonne Davidson is a 83 y.o. female  past medical history of PAD, carotid stenosis Hyperlipidemia DM  hypertension,  obstructive sleep apnea who is followed at [facility],  chronic pain in her legs,  GERD,  Anxiety  OSA, uses bipap She presents for follow-up of her blood pressure and carotid disease  Last seen by myself in clinic  November 2024 Viral infection 2/25, tough time  Reports she continues to wake at night, anxious  2 AM until 4 AM  Checks her blood pressure which always runs high Takes extra clonidine  at 2 AM, sometimes another extra clonidine  at 4 AM Early this morning took 1/2 dose to isorbide dinitrate  Was sleeping in recliner, now in the bed Having more GERD Compliant with her facemask but reports it does not fit well, working with pulmonary  Weight down 5 pounds since 11/24  Labs reviewed A1C 7.2  Other past medical history reviewed Carotid ultrasound November 2023 1 to 39% right internal carotid artery stenosis  40 to 59% left internal carotid artery stenosis.  Unchanged  Current medication list Clonidine  0.1 mg 8:30 pm, 10 PM, 2 Am, 3-4 AM Bystolic  10 mg at dinner Olmesrtan 40 mg daily Has isosorbide  dinitrate 20 PRN  Got covid July 2022  Lab work reviewed A1C 7 Total chol 185, LDL 100, zetia  daily,  Off crestor  ,numbers better,  started psyllium husk  EKG personally reviewed by myself on todays visit EKG Interpretation Date/Time:  Monday  November 14 2023 11:14:12 EDT Ventricular Rate:  51 PR Interval:  142 QRS Duration:  88 QT Interval:  458 QTC Calculation: 422 R Axis:   40  Text Interpretation: Sinus bradycardia When compared with ECG of 14-Dec-2022 10:18, No significant change was found Confirmed by Perla Lye [phone]) on 11/14/2023 11:42:05 AM    Other past medical history reviewed Followed by Dr. Marea for renal artery stenosis Underwent renal angiogram April 14, 2020 : Aorta and iliac arteries were relatively normal.  Both renal arteries had calcific plaque with minimal stenosis of less than 25%. The SMA had some calcific plaque and stenosis in the 30% range but nothing significant.  Intolerances:  off amlodipine  secondary to leg swelling  Unable to tolerate any diuretics per the patient including HCTZ, Lasix, Aldactone She is cautious about taking extra doses of Bystolic  or clonidine  concerned about bradycardia but has never had symptoms  She stopped Crestor  on her own Previously reported having mold in her attic in the past, felt it was affecting her breathing Had testing done on her house    she has chronic aching in her legs. She attributes this to sleep apnea and poor blood supply to her legs   She  retired at the age of 31. She was working in [employer]. She was unable to maintain the pace with such poor sleep on a chronic basis   Past Medical History:  Diagnosis Date  Allergy    Anemia    Anxiety    Arthritis    Bilateral cataracts    immature   Carotid arterial disease    a. RICA 40-59%, LICA 60-79%.   Chronic sinusitis    takes Cetirizine daily   Depression    doesn't take any meds   Diabetes mellitus without complication (HCC)    borderline   Diastolic dysfunction    a. 05/2011: EF 60%, trace AI, trace MR, mild TR; b. 10/2016 Echo: EF 60-65%, no rmwa, GrI DD, triv AI, mild MR, mildly dil LA.   Diverticulosis    Dry eyes    GERD (gastroesophageal reflux disease)    TAKES OMEPRAZOLE   DAILY   Hashimoto's thyroiditis    History of blood transfusion    in the 60's no abnormal reaction noted   Hyperlipidemia    a. self discontinued statin   Hypertension    Insomnia    takes Trazodone  nightly   Medication intolerance    OSA (obstructive sleep apnea)    doesn't use a cpap   Osteoporosis    takes Vit D as needed   PAD (peripheral artery disease)    a. 03/2020 Angio: Relatively nl Ao and iliac arteries. <25% bilat RAS. SMA 30%->Med rx.   PONV (postoperative nausea and vomiting)    hard to wake up   Renal artery stenosis    a. 03/2020 Renal angio: <25% bilat RAS.   Unspecified gastritis and gastroduodenitis without mention of hemorrhage 05/17/2012   Past Surgical History:  Procedure Laterality Date   CHOLECYSTECTOMY  12/07/2012   Dr Ethyl   CHOLECYSTECTOMY N/A 12/07/2012   Procedure: LAPAROSCOPIC CHOLECYSTECTOMY WITH INTRAOPERATIVE CHOLANGIOGRAM;  Surgeon: Alm VEAR Ethyl, MD;  Location: [facility] OR;  Service: General;  Laterality: N/A;   LIPOSUCTION     NASAL SINUS SURGERY  2006   x 2   RENAL ANGIOGRAPHY Right 04/14/2020   Procedure: RENAL ANGIOGRAPHY;  Surgeon: Marea Selinda RAMAN, MD;  Location: [facility] INVASIVE CV LAB;  Service: Cardiovascular;  Laterality: Right;   right arm surgery  1960   TCS       Current Meds  Medication Sig   albuterol  (VENTOLIN  HFA) 108 (90 Base) MCG/ACT inhaler Inhale 2 puffs into the lungs every 4 (four) hours as needed.   B-D ULTRA-FINE 33 LANCETS MISC Use as directed. Dx 250.0   Blood Glucose Monitoring Suppl (ONE TOUCH ULTRA MINI) W/DEVICE KIT USE AS DIRECTED   cloNIDine  (CATAPRES ) 0.1 MG tablet Take 1 tablet (0.1 mg total) by mouth 3 (three) times daily.   ezetimibe  (ZETIA ) 10 MG tablet Take 1 tablet (10 mg total) by mouth daily.   isosorbide  dinitrate (ISORDIL ) 20 MG tablet Take 1 tablet (20 mg total) by mouth 3 (three) times daily as needed (for pressure >160).   metFORMIN  (GLUCOPHAGE ) 500 MG tablet Take 250 mg by mouth 2 (two) times daily  with a meal.   nebivolol  (BYSTOLIC ) 10 MG tablet Take 1 tablet (10 mg total) by mouth daily.   olmesartan  (BENICAR ) 40 MG tablet TAKE ONE TABLET BY MOUTH ONCE DAILY   ONETOUCH DELICA LANCETS 33G MISC Use as directed. Dx 250.0   rosuvastatin  (CRESTOR ) 5 MG tablet Take 1 tablet (5 mg total) by mouth daily.     Allergies:   Buprenorphine hcl, Chlorphen-diphenhyd-pe-apap, Codeine, Hydrochlorothiazide, Indomethacin, Mold extract [trichophyton mentagrophyte], Morphine , Morphine  and codeine, Oxycodone, Oxycodone hcl, Oxycodone hcl, Procaine, Sumycin [tetracycline hcl], Tetracycline, Trichophyton, Amlodipine , Brompheniramine-pseudoeph, Cardura  [doxazosin  mesylate], Chlorphen-phenyleph-asa, Decongest-aid [pseudoephedrine], Esomeprazole magnesium,  Esomeprazole magnesium, Hydralazine , Neomycin-bacitracin-polymyxin [bacitracin-neomycin-polymyxin], Prednisone , Procaine hcl, Cephalexin, Latex, Neomycin-bacitracin zn-polymyx, Pseudoephedrine hcl er, Sudafed pe cold & cough child  [phenylephrine-dm], and Telithromycin   Social History   Tobacco Use   Smoking status: Former    Current packs/day: 0.00    Types: Cigarettes   Smokeless tobacco: Never   Tobacco comments:    quit in 1978  Vaping Use   Vaping status: Never Used  Substance Use Topics   Alcohol  use: No    Alcohol /week: 0.0 standard drinks of alcohol    Drug use: No     Family Hx: The patient's family history includes Alcohol  abuse in her father; Alzheimer's disease in her brother; Aneurysm in her mother; Arthritis in her mother; Breast cancer in her mother; Cancer in her mother; Early death in her father; Heart disease in her mother; Hyperlipidemia in her mother; Hypertension in her mother; Rheum arthritis in her brother; Stroke in her mother.  ROS:   Please see the history of present illness.    Review of Systems  Constitutional: Negative.   HENT: Negative.    Respiratory: Negative.    Cardiovascular: Negative.   Gastrointestinal:  Negative.   Musculoskeletal: Negative.   Neurological: Negative.   Psychiatric/Behavioral: Negative.    All other systems reviewed and are negative.    Labs/Other Tests and Data Reviewed:    Recent Labs: 04/07/2023: ALT 21; BUN 22; Creatinine, Ser 0.88; Hemoglobin 11.8; Platelets PLATELET CLUMPS NOTED ON SMEAR, UNABLE TO ESTIMATE; Potassium 4.3; Sodium 135   Recent Lipid Panel Lab Results  Component Value Date/Time   CHOL 126 03/03/2015 08:39 AM   TRIG 62 03/03/2015 08:39 AM   HDL 47 03/03/2015 08:39 AM   CHOLHDL 2.7 03/03/2015 08:39 AM   CHOLHDL 6 01/15/2014 08:17 AM   LDLCALC 67 03/03/2015 08:39 AM   LDLDIRECT 156.6 05/17/2012 03:44 PM    Wt Readings from Last 3 Encounters:  11/14/23 144 lb (65.3 kg)  04/07/23 138 lb (62.6 kg)  02/28/23 151 lb (68.5 kg)     Exam:    Vital Signs: Vital signs may also be detailed in the HPI BP (!) 120/50 (BP Location: Left Arm, Patient Position: Sitting, Cuff Size: Normal)   Pulse (!) 51   Ht 5' 2 (1.575 m)   Wt 144 lb (65.3 kg)   SpO2 97%   BMI 26.34 kg/m   Constitutional:  oriented to person, place, and time. No distress.  HENT:  Head: Normocephalic and atraumatic.  Eyes:  no discharge. No scleral icterus.  Neck: Normal range of motion. Neck supple. No JVD present.  Cardiovascular: Normal rate, regular rhythm, normal heart sounds and intact distal pulses. Exam reveals no gallop and no friction rub. No edema No murmur heard. Pulmonary/Chest: Effort normal and breath sounds normal. No stridor. No respiratory distress.  no wheezes.  no rales.  no tenderness.  Abdominal: Soft.  no distension.  no tenderness.  Musculoskeletal: Normal range of motion.  no  tenderness or deformity.  Neurological:  normal muscle tone. Coordination normal. No atrophy Skin: Skin is warm and dry. No rash noted. not diaphoretic.  Psychiatric:  normal mood and affect. behavior is normal. Thought content normal.    ASSESSMENT & PLAN:    Bilateral carotid  artery stenosis -  U/s nov 2023, 1 to 39% right internal carotid artery stenosis with 40 to 59% left internal carotid artery stenosis  She has taken herself off Crestor   on Zetia  10 daily alone Ideally should stay on Crestor  daily  but reports her lipid numbers were better.  Review of recent numbers detail they are not at goal, LDL high   Hypertension goal BP (blood pressure) < 140/90 - Very anxious at night waking 2 AM until 4 AM takes 2 extra clonidine  and sometimes isosorbide  to treat her anxiety.  Recommend she discuss with pulmonary and primary care   Hyperlipidemia LDL goal <100 - Plan: EKG 87-Ozji on zetia , reports that she stopped her Crestor  5 two days a week History of statin intolerance Goal LDL less than 70   OSA on CPAP - Plan: EKG 12-Lead On bipap, followed by [facility] For many years, continues to wake up middle of the night immediately checks blood pressure and starts taking extra clonidine  in effort to go back to sleep  Anxiety Managed by primary care, exacerbated by poor sleep hygiene  Arthritis On OTC nmeds    Signed, Evalene Lunger, MD  11/14/2023 11:47 AM    [facility] [address], [place], [place] [address]

## 2023-11-14 ENCOUNTER — Ambulatory Visit: Attending: Cardiovascular Disease | Admitting: Cardiovascular Disease

## 2023-11-14 ENCOUNTER — Encounter: Payer: Self-pay | Admitting: Cardiovascular Disease

## 2023-11-14 VITALS — BP 120/50 | HR 51 | Ht 62.0 in | Wt 144.0 lb

## 2023-11-14 DIAGNOSIS — I6523 Occlusion and stenosis of bilateral carotid arteries: Secondary | ICD-10-CM | POA: Diagnosis not present

## 2023-11-14 DIAGNOSIS — I774 Celiac artery compression syndrome: Secondary | ICD-10-CM

## 2023-11-14 DIAGNOSIS — E782 Mixed hyperlipidemia: Secondary | ICD-10-CM | POA: Diagnosis not present

## 2023-11-14 DIAGNOSIS — R0602 Shortness of breath: Secondary | ICD-10-CM | POA: Diagnosis not present

## 2023-11-14 DIAGNOSIS — I10 Essential (primary) hypertension: Secondary | ICD-10-CM | POA: Diagnosis not present

## 2023-11-14 DIAGNOSIS — I272 Pulmonary hypertension, unspecified: Secondary | ICD-10-CM | POA: Diagnosis not present

## 2023-11-14 DIAGNOSIS — I739 Peripheral vascular disease, unspecified: Secondary | ICD-10-CM | POA: Diagnosis not present

## 2023-11-14 DIAGNOSIS — K551 Chronic vascular disorders of intestine: Secondary | ICD-10-CM | POA: Diagnosis not present

## 2023-11-14 MED ORDER — OLMESARTAN MEDOXOMIL 40 MG PO TABS: 40.0000 mg | ORAL_TABLET | Freq: Every day | ORAL | 3 refills | Status: AC

## 2023-11-14 MED ORDER — CLONIDINE HCL 0.1 MG PO TABS: 0.1000 mg | ORAL_TABLET | Freq: Three times a day (TID) | ORAL | 3 refills | Status: AC

## 2023-11-14 MED ORDER — ROSUVASTATIN CALCIUM 5 MG PO TABS: 5.0000 mg | ORAL_TABLET | Freq: Every day | ORAL | 3 refills | Status: AC

## 2023-11-14 MED ORDER — EZETIMIBE 10 MG PO TABS
10.0000 mg | ORAL_TABLET | Freq: Every day | ORAL | 3 refills | Status: AC
Start: 2023-11-14 — End: ?

## 2023-11-14 MED ORDER — ISOSORBIDE DINITRATE 20 MG PO TABS: 20.0000 mg | ORAL_TABLET | Freq: Three times a day (TID) | ORAL | 1 refills | Status: AC | PRN

## 2023-11-14 NOTE — Patient Instructions (Addendum)
 Medication Instructions:   Clonidine  three times a day with extra as needed   If you need a refill on your cardiac medications before your next appointment, please call your pharmacy.   Lab work: No new labs needed  Testing/Procedures: No new testing needed  Follow-Up: At [facility], you and your health needs are our priority.  As part of our continuing mission to provide you with exceptional heart care, we have created designated Provider Care Teams.  These Care Teams include your primary Cardiologist (physician) and Advanced Practice Providers (APPs -  Physician Assistants and Nurse Practitioners) who all work together to provide you with the care you need, when you need it.  You will need a follow up appointment in 12 months  Providers on your designated Care Team:   Lonni Meager, NP Bernardino Bring, PA-C Cadence [place], [place]  COVID-19 Vaccine Information can be found at: [email or web address] For questions related to vaccine distribution or appointments, please email [email or web address] or call [phone].

## 2023-12-15 DIAGNOSIS — E1165 Type 2 diabetes mellitus with hyperglycemia: Secondary | ICD-10-CM | POA: Diagnosis not present

## 2023-12-15 DIAGNOSIS — E038 Other specified hypothyroidism: Secondary | ICD-10-CM | POA: Diagnosis not present

## 2023-12-19 ENCOUNTER — Ambulatory Visit: Admitting: Cardiovascular Disease

## 2023-12-20 DIAGNOSIS — E038 Other specified hypothyroidism: Secondary | ICD-10-CM | POA: Diagnosis not present

## 2024-01-17 ENCOUNTER — Telehealth: Payer: Self-pay | Admitting: Cardiovascular Disease

## 2024-01-17 NOTE — Telephone Encounter (Signed)
 Pt c/o medication issue:  1. Name of Medication: cloNIDine  (CATAPRES ) 0.1 MG tablet   2. How are you currently taking this medication (dosage and times per day)?   3. Are you having a reaction (difficulty breathing--STAT)?   4. What is your medication issue? Patient called requesting for the dosage to be increased.

## 2024-01-20 NOTE — Telephone Encounter (Signed)
 Called and spoke with the patient regarding her request to increase dosage of Clonidine .  Patient appeared to be upset and irritated that this RN asked her for some blood pressure numbers stating that Dr. Perla knows me very well, and he and I have been together for a long time. 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 Gollan for review and recommendation.

## 2024-01-20 NOTE — Telephone Encounter (Signed)
 Patient returned RN's call regarding medication.

## 2024-01-23 ENCOUNTER — Other Ambulatory Visit (HOSPITAL_COMMUNITY): Payer: Self-pay

## 2024-01-23 MED ORDER — CLONIDINE HCL 0.2 MG PO TABS
0.2000 mg | ORAL_TABLET | Freq: Every day | ORAL | 1 refills | Status: DC
  Filled 2024-01-23: qty 90, 90d supply, fill #0

## 2024-01-23 NOTE — Telephone Encounter (Signed)
 Pt made aware and Rx sent to pharmacy as requested by Dr. Gollan.    Gollan, Timothy J, MD to Cristopher Olivia PARAS, RN (Selected Message)    01/22/24 12:46 PM She typically will wake at 2 AM with anxiety and take 2 extra clonidine  0.1 typically at 2 AM and 3 or 4 AM We can give her a separate prescription for clonidine  0.2 to take as needed at 2 AM. Clonidine  0.2 daily as needed # 90 with 1 refill Thx TGollan

## 2024-01-25 ENCOUNTER — Other Ambulatory Visit (HOSPITAL_COMMUNITY): Payer: Self-pay

## 2024-01-25 ENCOUNTER — Other Ambulatory Visit: Payer: Self-pay

## 2024-01-25 MED ORDER — CLONIDINE HCL 0.2 MG PO TABS: 0.2000 mg | ORAL_TABLET | Freq: Every day | ORAL | 1 refills | Status: AC

## 2024-01-25 NOTE — Telephone Encounter (Signed)
 Pt states she called her pharmacy and they do not have the medication. Please advise.   [facility] - [place], [place] - [address]

## 2024-01-25 NOTE — Telephone Encounter (Signed)
 Called patient to discuss. She stated pharmacy has not received the medication. I told her I would check with the pharmacy, I called the pharmacy and they stated they still had not received it and order was placed 12/29. I re-ordered the medication and she stated it takes awhile to come through, the pharmacy will be closed from 930-1 and then open for regular business hours. I will call patient back and tell her to check back with them later on today.

## 2024-02-16 ENCOUNTER — Other Ambulatory Visit: Payer: Self-pay | Admitting: Cardiovascular Disease

## 2024-03-12 ENCOUNTER — Ambulatory Visit: Payer: HMO | Admitting: Internal Medicine
